# Patient Record
Sex: Male | Born: 1940 | Race: White | Hispanic: No | Marital: Married | Smoking: Former smoker
Health system: Southern US, Community
[De-identification: ages and names within clinical notes are randomized; demographics above are authoritative.]

## PROBLEM LIST (undated history)

## (undated) DIAGNOSIS — R55 Syncope and collapse: Secondary | ICD-10-CM

## (undated) DIAGNOSIS — G518 Other disorders of facial nerve: Secondary | ICD-10-CM

## (undated) DIAGNOSIS — I2510 Atherosclerotic heart disease of native coronary artery without angina pectoris: Secondary | ICD-10-CM

## (undated) DIAGNOSIS — G519 Disorder of facial nerve, unspecified: Secondary | ICD-10-CM

## (undated) DIAGNOSIS — I6789 Other cerebrovascular disease: Secondary | ICD-10-CM

## (undated) DIAGNOSIS — E669 Obesity, unspecified: Secondary | ICD-10-CM

## (undated) DIAGNOSIS — I10 Essential (primary) hypertension: Secondary | ICD-10-CM

## (undated) DIAGNOSIS — R0600 Dyspnea, unspecified: Secondary | ICD-10-CM

## (undated) DIAGNOSIS — E039 Hypothyroidism, unspecified: Secondary | ICD-10-CM

## (undated) DIAGNOSIS — E785 Hyperlipidemia, unspecified: Secondary | ICD-10-CM

## (undated) DIAGNOSIS — H02409 Unspecified ptosis of unspecified eyelid: Secondary | ICD-10-CM

## (undated) DIAGNOSIS — C641 Malignant neoplasm of right kidney, except renal pelvis: Secondary | ICD-10-CM

## (undated) DIAGNOSIS — E119 Type 2 diabetes mellitus without complications: Secondary | ICD-10-CM

## (undated) HISTORY — DX: Unspecified ptosis of unspecified eyelid: H02.409

## (undated) HISTORY — DX: Hyperlipidemia, unspecified: E78.5

## (undated) HISTORY — PX: CHOLECYSTECTOMY: SHX55

## (undated) HISTORY — DX: Obesity, unspecified: E66.9

## (undated) HISTORY — DX: Syncope and collapse: R55

## (undated) HISTORY — DX: Disorder of facial nerve, unspecified: G51.9

## (undated) HISTORY — PX: TOTAL KNEE ARTHROPLASTY: SHX125

## (undated) HISTORY — PX: CORONARY STENT PLACEMENT: SHX1402

## (undated) HISTORY — DX: Other disorders of facial nerve: G51.8

## (undated) HISTORY — DX: Essential (primary) hypertension: I10

## (undated) HISTORY — DX: Atherosclerotic heart disease of native coronary artery without angina pectoris: I25.10

## (undated) HISTORY — DX: Malignant neoplasm of right kidney, except renal pelvis: C64.1

## (undated) HISTORY — PX: TONSILLECTOMY: SUR1361

## (undated) HISTORY — DX: Other cerebrovascular disease: I67.89

## (undated) HISTORY — PX: MENISCECTOMY: SHX123

## (undated) HISTORY — DX: Hypothyroidism, unspecified: E03.9

---

## 1998-08-29 ENCOUNTER — Emergency Department (HOSPITAL_COMMUNITY): Admission: EM | Admit: 1998-08-29 | Discharge: 1998-08-29 | Payer: Self-pay | Admitting: *Deleted

## 1998-08-29 ENCOUNTER — Encounter: Payer: Self-pay | Admitting: *Deleted

## 1998-09-04 ENCOUNTER — Inpatient Hospital Stay (HOSPITAL_COMMUNITY): Admission: EM | Admit: 1998-09-04 | Discharge: 1998-09-09 | Payer: Self-pay | Admitting: Emergency Medicine

## 1998-09-04 ENCOUNTER — Encounter: Payer: Self-pay | Admitting: *Deleted

## 1998-09-05 ENCOUNTER — Encounter: Payer: Self-pay | Admitting: *Deleted

## 1998-10-07 ENCOUNTER — Encounter (HOSPITAL_COMMUNITY): Admission: RE | Admit: 1998-10-07 | Discharge: 1999-01-05 | Payer: Self-pay | Admitting: Cardiology

## 2002-07-20 ENCOUNTER — Encounter: Payer: Self-pay | Admitting: Emergency Medicine

## 2002-07-21 ENCOUNTER — Inpatient Hospital Stay (HOSPITAL_COMMUNITY): Admission: EM | Admit: 2002-07-21 | Discharge: 2002-07-24 | Payer: Self-pay | Admitting: Emergency Medicine

## 2002-07-22 ENCOUNTER — Encounter: Payer: Self-pay | Admitting: Internal Medicine

## 2003-01-10 ENCOUNTER — Ambulatory Visit (HOSPITAL_COMMUNITY): Admission: RE | Admit: 2003-01-10 | Discharge: 2003-01-10 | Payer: Self-pay | Admitting: Neurology

## 2005-11-22 ENCOUNTER — Ambulatory Visit: Payer: Self-pay | Admitting: Internal Medicine

## 2005-11-24 ENCOUNTER — Emergency Department (HOSPITAL_COMMUNITY): Admission: EM | Admit: 2005-11-24 | Discharge: 2005-11-25 | Payer: Self-pay | Admitting: Emergency Medicine

## 2005-12-03 ENCOUNTER — Ambulatory Visit: Payer: Self-pay | Admitting: Internal Medicine

## 2005-12-10 ENCOUNTER — Ambulatory Visit: Payer: Self-pay | Admitting: Internal Medicine

## 2005-12-10 ENCOUNTER — Encounter: Payer: Self-pay | Admitting: Cardiology

## 2005-12-10 ENCOUNTER — Ambulatory Visit: Payer: Self-pay

## 2005-12-17 ENCOUNTER — Ambulatory Visit (HOSPITAL_COMMUNITY): Admission: RE | Admit: 2005-12-17 | Discharge: 2005-12-17 | Payer: Self-pay | Admitting: Internal Medicine

## 2005-12-17 ENCOUNTER — Ambulatory Visit: Payer: Self-pay | Admitting: Internal Medicine

## 2005-12-29 ENCOUNTER — Ambulatory Visit: Payer: Self-pay

## 2008-04-25 ENCOUNTER — Emergency Department (HOSPITAL_COMMUNITY): Admission: EM | Admit: 2008-04-25 | Discharge: 2008-04-25 | Payer: Self-pay | Admitting: Emergency Medicine

## 2008-08-12 ENCOUNTER — Ambulatory Visit: Payer: Self-pay | Admitting: Cardiology

## 2008-08-12 ENCOUNTER — Inpatient Hospital Stay (HOSPITAL_COMMUNITY): Admission: EM | Admit: 2008-08-12 | Discharge: 2008-08-14 | Payer: Self-pay | Admitting: Emergency Medicine

## 2008-08-22 ENCOUNTER — Encounter (HOSPITAL_COMMUNITY): Admission: RE | Admit: 2008-08-22 | Discharge: 2008-11-20 | Payer: Self-pay | Admitting: Internal Medicine

## 2008-09-04 DIAGNOSIS — I10 Essential (primary) hypertension: Secondary | ICD-10-CM | POA: Insufficient documentation

## 2008-09-04 DIAGNOSIS — E7800 Pure hypercholesterolemia, unspecified: Secondary | ICD-10-CM

## 2008-09-04 DIAGNOSIS — I2510 Atherosclerotic heart disease of native coronary artery without angina pectoris: Secondary | ICD-10-CM | POA: Insufficient documentation

## 2008-09-04 DIAGNOSIS — I200 Unstable angina: Secondary | ICD-10-CM

## 2008-09-05 ENCOUNTER — Encounter: Payer: Self-pay | Admitting: Cardiology

## 2008-09-05 ENCOUNTER — Ambulatory Visit: Payer: Self-pay | Admitting: Cardiology

## 2008-09-05 DIAGNOSIS — E039 Hypothyroidism, unspecified: Secondary | ICD-10-CM | POA: Insufficient documentation

## 2008-09-05 DIAGNOSIS — Z8679 Personal history of other diseases of the circulatory system: Secondary | ICD-10-CM

## 2008-09-06 ENCOUNTER — Ambulatory Visit: Payer: Self-pay | Admitting: Cardiology

## 2008-09-06 LAB — CONVERTED CEMR LAB
Free T4: 1.1 ng/dL (ref 0.6–1.6)
T3, Free: 3.4 pg/mL (ref 2.3–4.2)

## 2008-09-23 ENCOUNTER — Encounter: Payer: Self-pay | Admitting: Pulmonary Disease

## 2008-09-23 ENCOUNTER — Ambulatory Visit (HOSPITAL_BASED_OUTPATIENT_CLINIC_OR_DEPARTMENT_OTHER): Admission: RE | Admit: 2008-09-23 | Discharge: 2008-09-23 | Payer: Self-pay | Admitting: Cardiology

## 2008-09-28 ENCOUNTER — Ambulatory Visit: Payer: Self-pay | Admitting: Pulmonary Disease

## 2008-11-06 ENCOUNTER — Ambulatory Visit: Payer: Self-pay | Admitting: Pulmonary Disease

## 2008-11-06 DIAGNOSIS — G4733 Obstructive sleep apnea (adult) (pediatric): Secondary | ICD-10-CM | POA: Insufficient documentation

## 2008-11-08 ENCOUNTER — Telehealth: Payer: Self-pay | Admitting: Cardiology

## 2008-11-21 ENCOUNTER — Encounter (HOSPITAL_COMMUNITY): Admission: RE | Admit: 2008-11-21 | Discharge: 2009-02-19 | Payer: Self-pay | Admitting: Cardiology

## 2009-02-06 ENCOUNTER — Encounter: Payer: Self-pay | Admitting: Cardiology

## 2009-04-22 ENCOUNTER — Encounter: Payer: Self-pay | Admitting: Pulmonary Disease

## 2009-04-25 ENCOUNTER — Encounter: Payer: Self-pay | Admitting: Pulmonary Disease

## 2010-05-26 ENCOUNTER — Telehealth: Payer: Self-pay | Admitting: Cardiology

## 2010-05-28 ENCOUNTER — Encounter: Payer: Self-pay | Admitting: Cardiology

## 2010-06-01 ENCOUNTER — Encounter: Payer: Self-pay | Admitting: Cardiology

## 2010-06-02 ENCOUNTER — Ambulatory Visit: Payer: Self-pay | Admitting: Cardiology

## 2010-07-19 HISTORY — PX: NEPHRECTOMY RADICAL: SUR878

## 2010-08-18 NOTE — Assessment & Plan Note (Signed)
Summary: rov/ appt is 4 p.m. / gd   Visit Type:  Follow-up Primary Provider:  Mila Palmer, MD  CC:  CAD.  History of Present Illness: The patient presents for followup of his known coronary disease. Since I last saw him he has lost 50 pounds through diet and healthy lifestyle. He feels much better since then. He sent his lipids followed and has been able to cut down on his medicine. He has more energy. He is starting to do some exercising including a stationary bicycle. He denies any chest pressure, neck or arm discomfort. He denies any palpitations, presyncope or syncope. He is having no PND or orthopnea.  Allergies: No Known Drug Allergies  Past History:  Past Medical History: 1. Coronary artery disease (100% right coronary artery occlusion       status post stenting, 85% circumflex occlusion status post       stenting, 50% LAD stenosis managed in 2000 medically).   2. Syncope (the patient had a negative EP study.  He saw Dr. Ladona Ridgel       and had a loop implanted.  He had this for a couple of years.  He       actually had an event, but after the loop had stopped recording.       He had his last syncopal episode in 2007, and has had none since       then.  He has had his loop explanted).   3. Hypothyroidism.   4. Obesity.   5. Hypertension.   6. Hyperlipidemia.      Past Surgical History: Arthroscopic knee surgery Tonsillectomy Cholecystectomy.   Review of Systems       As stated in the HPI and negative for all other systems.   Vital Signs:  Patient profile:   70 year old male Height:      68 inches Weight:      228 pounds BMI:     34.79 Pulse rate:   60 / minute Resp:     16 per minute BP sitting:   118 / 72  (right arm)  Vitals Entered By: Marrion Coy, CNA (June 02, 2010 4:13 PM)  Physical Exam  General:  Well developed, well nourished, in no acute distress. Head:  normocephalic and atraumatic Neck:  Neck supple, no JVD. No masses, thyromegaly or  abnormal cervical nodes. Chest Wall:  no deformities or breast masses noted Lungs:  Clear bilaterally to auscultation and percussion. Heart:  Non-displaced PMI, chest non-tender; regular rate and rhythm, S1, S2 without murmurs, rubs or gallops. Carotid upstroke normal, no bruit. Normal abdominal aortic size, no bruits. Femorals normal pulses, no bruits. Pedals normal pulses. No edema, no varicosities. Abdomen:  Bowel sounds positive; abdomen soft and non-tender without masses, organomegaly, or hernias noted. No hepatosplenomegaly. Msk:  Back normal, normal gait. Muscle strength and tone normal. Extremities:  No clubbing or cyanosis. Neurologic:  Alert and oriented x 3. Skin:  Intact without lesions or rashes. Cervical Nodes:  no significant adenopathy Inguinal Nodes:  no significant adenopathy Psych:  Normal affect.   EKG  Procedure date:  06/02/2010  Findings:      sinus rhythm, rate 64, axis within normal limits, intervals within normal limits, nonspecific inferior T-wave changes  Impression & Recommendations:  Problem # 1:  CAD, UNSPECIFIED SITE (ICD-414.00) He has had no new symptoms since his catheterization in January of last year. No further cardiovascular testing is suggested. He will continue with risk reduction. Orders: EKG  w/ Interpretation (93000)  Problem # 2:  MORBID OBESITY (ICD-278.01) I am very proud of his weight loss and encourage more of the same. He is now doing exercise to his diet regimen.  Problem # 3:  HYPERCHOLESTEROLEMIA (ICD-272.0) This is managed per his primary physician with a goal LDL less than 100 and HDL greater than 40.  Patient Instructions: 1)  Your physician recommends that you schedule a follow-up appointment in: 18 months with Dr Antoine Poche 2)  Your physician recommends that you continue on your current medications as directed. Please refer to the Current Medication list given to you today.

## 2010-08-18 NOTE — Miscellaneous (Signed)
  Clinical Lists Changes  Observations: Added new observation of SLEEP STUDY:  1. Split night study reveals severe obstructive sleep apnea with an       apnea-hypopnea index of 109 events per hour during the diagnostic       portion, and oxygen desaturation as low as 68%.  He was then placed       on continuous positive airway pressure with a ResMed Quattro full-       face mask, and found to have an optimal pressure of 12 cm of water.       The patient should also be encouraged to work aggressively on       weight loss.   2. Occasional premature atrial contraction and premature ventricular       contraction seen without clinically significant arrhythmia noted.        (09/23/2008 9:40)      Sleep Study  Procedure date:  09/23/2008  Findings:       1. Split night study reveals severe obstructive sleep apnea with an       apnea-hypopnea index of 109 events per hour during the diagnostic       portion, and oxygen desaturation as low as 68%.  He was then placed       on continuous positive airway pressure with a ResMed Quattro full-       face mask, and found to have an optimal pressure of 12 cm of water.       The patient should also be encouraged to work aggressively on       weight loss.   2. Occasional premature atrial contraction and premature ventricular       contraction seen without clinically significant arrhythmia noted.

## 2010-08-18 NOTE — Progress Notes (Signed)
Summary: Pt need to stop plavix due to procedure on 06/04/10  Phone Note From Other Clinic Call back at [phone]   Caller: [facility]/ Britta Mccreedy Summary of Call: Need to stop Plavix due to procedure 06/04/10 Initial call taken by: Judie Grieve,  May 26, 2010 2:01 PM  Follow-up for Phone Call        The patient is long overdue for follow up and he needs to see me before I can clear him for any procedure. Follow-up by: Rollene Rotunda, MD, [facility],  May 28, 2010 1:21 PM  Additional Follow-up for Phone Call Additional follow up Details #1::        Request sent for pt to be called with an appt. Additional Follow-up by: Charolotte Capuchin, RN,  May 28, 2010 4:52 PM

## 2010-08-20 NOTE — Letter (Signed)
[facility]   [facility]ance Note   Imported By: Roderic Ovens 08/14/2010 13:12:56  _____________________________________________________________________  External Attachment:    Type:   Image     Comment:   External Document

## 2010-10-28 ENCOUNTER — Emergency Department (HOSPITAL_COMMUNITY): Payer: Medicare Other

## 2010-10-28 ENCOUNTER — Observation Stay (HOSPITAL_COMMUNITY)
Admission: EM | Admit: 2010-10-28 | Discharge: 2010-10-30 | Disposition: A | Discharge status: Home / Self Care | Payer: Medicare Other | Attending: Internal Medicine | Admitting: Internal Medicine

## 2010-10-28 DIAGNOSIS — F959 Tic disorder, unspecified: Secondary | ICD-10-CM | POA: Insufficient documentation

## 2010-10-28 DIAGNOSIS — I252 Old myocardial infarction: Secondary | ICD-10-CM | POA: Insufficient documentation

## 2010-10-28 DIAGNOSIS — R319 Hematuria, unspecified: Secondary | ICD-10-CM | POA: Insufficient documentation

## 2010-10-28 DIAGNOSIS — I10 Essential (primary) hypertension: Secondary | ICD-10-CM | POA: Insufficient documentation

## 2010-10-28 DIAGNOSIS — Z7902 Long term (current) use of antithrombotics/antiplatelets: Secondary | ICD-10-CM | POA: Insufficient documentation

## 2010-10-28 DIAGNOSIS — Z7982 Long term (current) use of aspirin: Secondary | ICD-10-CM | POA: Insufficient documentation

## 2010-10-28 DIAGNOSIS — Z9089 Acquired absence of other organs: Secondary | ICD-10-CM | POA: Insufficient documentation

## 2010-10-28 DIAGNOSIS — Z79899 Other long term (current) drug therapy: Secondary | ICD-10-CM | POA: Insufficient documentation

## 2010-10-28 DIAGNOSIS — E039 Hypothyroidism, unspecified: Secondary | ICD-10-CM | POA: Insufficient documentation

## 2010-10-28 DIAGNOSIS — R9431 Abnormal electrocardiogram [ECG] [EKG]: Secondary | ICD-10-CM | POA: Insufficient documentation

## 2010-10-28 DIAGNOSIS — E785 Hyperlipidemia, unspecified: Secondary | ICD-10-CM | POA: Insufficient documentation

## 2010-10-28 DIAGNOSIS — E7800 Pure hypercholesterolemia, unspecified: Secondary | ICD-10-CM | POA: Insufficient documentation

## 2010-10-28 DIAGNOSIS — N390 Urinary tract infection, site not specified: Secondary | ICD-10-CM | POA: Insufficient documentation

## 2010-10-28 DIAGNOSIS — N400 Enlarged prostate without lower urinary tract symptoms: Secondary | ICD-10-CM | POA: Insufficient documentation

## 2010-10-28 DIAGNOSIS — I2510 Atherosclerotic heart disease of native coronary artery without angina pectoris: Secondary | ICD-10-CM | POA: Insufficient documentation

## 2010-10-28 DIAGNOSIS — N289 Disorder of kidney and ureter, unspecified: Principal | ICD-10-CM | POA: Insufficient documentation

## 2010-10-28 DIAGNOSIS — K449 Diaphragmatic hernia without obstruction or gangrene: Secondary | ICD-10-CM | POA: Insufficient documentation

## 2010-10-28 DIAGNOSIS — B952 Enterococcus as the cause of diseases classified elsewhere: Secondary | ICD-10-CM | POA: Insufficient documentation

## 2010-10-28 DIAGNOSIS — I700 Atherosclerosis of aorta: Secondary | ICD-10-CM | POA: Insufficient documentation

## 2010-10-28 LAB — CBC
HCT: 43.9 % (ref 39.0–52.0)
Hemoglobin: 14.5 g/dL (ref 13.0–17.0)
MCH: 27.8 pg (ref 26.0–34.0)
MCHC: 33 g/dL (ref 30.0–36.0)
Platelets: 227 10*3/uL (ref 150–400)
RDW: 14 % (ref 11.5–15.5)
WBC: 10.4 10*3/uL (ref 4.0–10.5)

## 2010-10-28 LAB — BASIC METABOLIC PANEL
BUN: 21 mg/dL (ref 6–23)
Calcium: 9.1 mg/dL (ref 8.4–10.5)
Creatinine, Ser: 1.04 mg/dL (ref 0.4–1.5)
GFR calc non Af Amer: >60 mL/min (ref >60)
Glucose, Bld: 87 mg/dL (ref 70–99)
Potassium: 3.5 mEq/L (ref 3.5–5.1)

## 2010-10-28 LAB — URINALYSIS, ROUTINE W REFLEX MICROSCOPIC: pH: 5 (ref 5.0–8.0)

## 2010-10-28 LAB — PROTIME-INR: Prothrombin Time: 13.8 seconds (ref 11.6–15.2)

## 2010-10-28 LAB — URINE MICROSCOPIC-ADD ON

## 2010-10-28 LAB — DIFFERENTIAL
Basophils Absolute: 0 10*3/uL (ref 0.0–0.1)
Basophils Relative: 0 % (ref 0–1)
Eosinophils Absolute: 0 10*3/uL (ref 0.0–0.7)
Eosinophils Relative: 0 % (ref 0–5)
Lymphocytes Relative: 20 % (ref 12–46)
Lymphs Abs: 2.1 10*3/uL (ref 0.7–4.0)
Monocytes Absolute: 1 10*3/uL (ref 0.1–1.0)
Monocytes Relative: 10 % (ref 3–12)
Neutrophils Relative %: 69 % (ref 43–77)

## 2010-10-29 ENCOUNTER — Emergency Department (HOSPITAL_COMMUNITY): Payer: Medicare Other

## 2010-10-29 ENCOUNTER — Encounter (HOSPITAL_COMMUNITY): Payer: Self-pay | Admitting: Radiology

## 2010-10-29 LAB — DIFFERENTIAL
Eosinophils Absolute: 0.1 10*3/uL (ref 0.0–0.7)
Eosinophils Relative: 1 % (ref 0–5)
Lymphocytes Relative: 24 % (ref 12–46)

## 2010-10-29 LAB — COMPREHENSIVE METABOLIC PANEL
ALT: 19 U/L (ref 0–53)
Albumin: 3.4 g/dL — ABNORMAL LOW (ref 3.5–5.2)
BUN: 16 mg/dL (ref 6–23)
Calcium: 8.8 mg/dL (ref 8.4–10.5)
GFR calc Af Amer: >60 mL/min (ref >60)
GFR calc non Af Amer: >60 mL/min (ref >60)
Glucose, Bld: 102 mg/dL — ABNORMAL HIGH (ref 70–99)
Total Bilirubin: 0.8 mg/dL (ref 0.3–1.2)

## 2010-10-29 LAB — CBC
Platelets: 214 10*3/uL (ref 150–400)
RDW: 14.2 % (ref 11.5–15.5)

## 2010-10-29 LAB — PHOSPHORUS: Phosphorus: 3.5 mg/dL (ref 2.3–4.6)

## 2010-10-29 MED ORDER — IOHEXOL 300 MG/ML  SOLN
125.0000 mL | Freq: Once | INTRAMUSCULAR | Status: AC | PRN
  Administered 2010-10-29: 125 mL via INTRAVENOUS

## 2010-10-31 LAB — URINE CULTURE
Colony Count: 35000
Culture  Setup Time: 201204120346

## 2010-11-02 LAB — BASIC METABOLIC PANEL
BUN: 16 mg/dL (ref 6–23)
BUN: 9 mg/dL (ref 6–23)
CO2: 27 mEq/L (ref 19–32)
Calcium: 9.3 mg/dL (ref 8.4–10.5)
Chloride: 101 mEq/L (ref 96–112)
Creatinine, Ser: 0.95 mg/dL (ref 0.4–1.5)
Creatinine, Ser: 0.96 mg/dL (ref 0.4–1.5)
GFR calc non Af Amer: >60 mL/min (ref >60)
Potassium: 3.9 mEq/L (ref 3.5–5.1)

## 2010-11-02 LAB — LIPID PANEL
Cholesterol: 131 mg/dL (ref 0–200)
HDL: 28 mg/dL — ABNORMAL LOW (ref >39)
LDL Cholesterol: UNDETERMINED mg/dL (ref 0–99)
Total CHOL/HDL Ratio: 4.7 RATIO

## 2010-11-02 LAB — CK TOTAL AND CKMB (NOT AT ARMC)
CK, MB: 2.1 ng/mL (ref 0.3–4.0)
Relative Index: 1.5 (ref 0.0–2.5)
Total CK: 122 U/L (ref 7–232)
Total CK: 141 U/L (ref 7–232)

## 2010-11-02 LAB — DIFFERENTIAL
Basophils Relative: 1 % (ref 0–1)
Basophils Relative: 1 % (ref 0–1)
Eosinophils Absolute: 0.1 10*3/uL (ref 0.0–0.7)
Eosinophils Absolute: 0.1 10*3/uL (ref 0.0–0.7)
Eosinophils Relative: 1 % (ref 0–5)
Eosinophils Relative: 1 % (ref 0–5)
Lymphs Abs: 2 10*3/uL (ref 0.7–4.0)
Monocytes Absolute: 0.7 10*3/uL (ref 0.1–1.0)
Monocytes Absolute: 0.8 10*3/uL (ref 0.1–1.0)
Monocytes Relative: 10 % (ref 3–12)
Monocytes Relative: 8 % (ref 3–12)

## 2010-11-02 LAB — CBC
HCT: 41.3 % (ref 39.0–52.0)
HCT: 43.7 % (ref 39.0–52.0)
Hemoglobin: 14.4 g/dL (ref 13.0–17.0)
MCHC: 32.9 g/dL (ref 30.0–36.0)
MCHC: 33.1 g/dL (ref 30.0–36.0)
MCV: 85.5 fL (ref 78.0–100.0)
MCV: 85.6 fL (ref 78.0–100.0)
MCV: 86.4 fL (ref 78.0–100.0)
Platelets: 229 10*3/uL (ref 150–400)
Platelets: 236 10*3/uL (ref 150–400)
RBC: 5.06 MIL/uL (ref 4.22–5.81)
RDW: 13.9 % (ref 11.5–15.5)
WBC: 8.2 10*3/uL (ref 4.0–10.5)

## 2010-11-02 LAB — PROTIME-INR: Prothrombin Time: 13.3 seconds (ref 11.6–15.2)

## 2010-11-02 LAB — CARDIAC PANEL(CRET KIN+CKTOT+MB+TROPI): Troponin I: <0.01 ng/mL (ref 0.00–0.06)

## 2010-11-02 LAB — COMPREHENSIVE METABOLIC PANEL
ALT: 27 U/L (ref 0–53)
Alkaline Phosphatase: 53 U/L (ref 39–117)
BUN: 16 mg/dL (ref 6–23)
CO2: 28 mEq/L (ref 19–32)
Calcium: 9 mg/dL (ref 8.4–10.5)
GFR calc non Af Amer: >60 mL/min (ref >60)
Glucose, Bld: 127 mg/dL — ABNORMAL HIGH (ref 70–99)
Sodium: 140 mEq/L (ref 135–145)

## 2010-11-02 LAB — HEMOGLOBIN A1C
Hgb A1c MFr Bld: 6.8 % — ABNORMAL HIGH (ref 4.6–6.1)
Mean Plasma Glucose: 148 mg/dL

## 2010-11-02 LAB — POCT CARDIAC MARKERS: Myoglobin, poc: 116 ng/mL (ref 12–200)

## 2010-11-02 NOTE — Discharge Summary (Signed)
NAME:  Thomas Michael, Thomas Michael NO.:  [id number]  MEDICAL RECORD NO.:  [id number]           PATIENT TYPE:  O  LOCATION:  1421                         FACILITY:  [facility]  PHYSICIAN:  Andreas Blower, MD       DATE OF BIRTH:  1941/03/30  DATE OF ADMISSION:  10/28/2010 DATE OF DISCHARGE:                              DISCHARGE SUMMARY   PRIMARY CARE PHYSICIAN:  Emeterio Reeve, MD.  UROLOGIST:  Dr. Lindaann Slough, MD  DISCHARGE DIAGNOSES: 1. Right renal mass. 2. Hematuria. 3. Enterococcus urinary tract infection. 4. Hypothyroidism. 5. Hypertension. 6. Hyperlipidemia. 7. History of coronary artery disease. 8. History of the right facial tick.  DISCHARGE MEDICATIONS: 1. Keflex 500 mg p.o. twice daily. 2. Ibuprofen 400 mg every 8 hours as needed for pain. 3. Aspirin 325 mg p.o. daily. 4. Multivitamin 1 tablet p.o. daily 5. Rosuvastatin 20 mg p.o. daily 6. Valsartan/hydrochlorothiazide 160/12.5 mg p.o. daily 7. Levothyroxine 200 mcg p.o. daily 8. Metoprolol succinate 47.5 mg p.o. q.a.m. 9. Niacin 50 mg p.o. daily 10.Plavix 75 mg p.o. daily. 11.Sildenafil 100 mg daily as needed prior to sexual intercourse. 12.Vitamin E 400 units p.o. daily  BRIEF ADMITTING HISTORY AND PHYSICAL:  Mr. Boy is a 70 year old Caucasian male with history of coronary artery disease, hypertension, status post cholecystectomy, who presented with suprapubic pain and hematuria.  RADIOLOGY/IMAGING:  The patient had a CT of the abdomen and pelvis with and without contrast on October 29, 2010, which showed enhancing 8.4 cm mass in the right lower pole highly suspicious for renal cell carcinoma.  The patient had chest x-ray two-view which showed no evidence of acute cardiac or pulmonary process.  The patient had CT of the abdomen and pelvis without contrast on October 28, 2010, which showed large right renal mass measuring 8.1 x 6.7 x 5.7 cm in maximal dimension.  Blood in the urinary bladder.   Mildly enlarged prostate gland.  Small to moderate sized hiatal hernia.  LABORATORY DATA:  CBC shows white count of 9.7, hemoglobin 13.4, hematocrit 41.2, platelet count 214.  INR is 1.04.  Electrolytes normal with a creatinine of 1.03.  Liver function tests normal.  TSH is 10.795. UA was positive for nitrites, had large leukocytes.  Urine culture growing 35,000 colonies of enterococcus species.  CONSULTATIONS:  Dr. Brunilda Payor from Urology was consulted.  HOSPITAL COURSE BY PROBLEM: 1. Hematuria and right renal mass.  Urology was consulted.  The     patient had the above imaging obtained.  Plan for outpatient     surgery.  The patient will most likely need cardiology evaluation     for surgical clearance prior to surgery.  The patient's hematuria     has improved on its own during the course of hospital stay.  The     patient was moderately hydrated during the course of hospital stay.     Prior to discharge, the patient voided about 250 cc of urine with     some intermittent blood clots.  The patient reports that he had no     difficulty voiding.  The patient's Foley catheter was  removed prior     to discharge and the patient voided successfully prior to     discharge. 2. Urinary tract infection.  The patient initially was started on     ceftriaxone.  Urine culture grew enterococcus species.  The     patient's antibiotics were changed to cephalexin which she will     continue for 3 more days to complete a 5-day course. 3. Hypothyroidism with elevated TSH of 10.795.  The patient was     instructed to follow up with his primary care physician and to have     a discussion with his primary care physician by rechecking the TSH     and free T4, based on those lab findings could have his Synthroid     adjusted at that time. 4. Hyperlipidemia.  Continue the patient on statin. 5. Hypertension, stable.  Continue the patient on medications. 6. History of coronary artery disease status post stent  placement.     Continue the patient on home medications and continue the patient     on Plavix at the time of discharge.  Again, the patient will need a     cardiology evaluation prior to surgery as to when the Plavix should     be started and see if the patient can be cleared for surgery from a     cardiac standpoint.  DISPOSITION AND FOLLOWUP:  The patient to follow up with Dr. Mila Palmer an outpatient for elevated TSH.  The patient to follow up with Dr. Brunilda Payor for surgical consideration for the right renal mass.  The patient to also consider following up with cardiology as an outpatient for cardiac evaluation prior to surgery.  Time spent on discharge, talking to the patient, talking to the family, talking to consultants, and coordinating care was 35 minutes.   Andreas Blower, MD   SR/MEDQ  D:  10/30/2010  T:  10/30/2010  Job:  [id number]  Electronically Signed by Wardell Heath Emanuell Morina  on 11/01/2010 10:50:56 AM

## 2010-11-14 NOTE — H&P (Signed)
NAME:  Thomas Michael, Thomas Michael NO.:  [id number]  MEDICAL RECORD NO.:  [id number]           PATIENT TYPE:  E  LOCATION:  WLED                         FACILITY:  [facility]  PHYSICIAN:  Michiel Cowboy, MDDATE OF BIRTH:  09/01/1940  DATE OF ADMISSION:  10/28/2010 DATE OF DISCHARGE:                             HISTORY & PHYSICAL   ATTENDING PHYSICIAN:  Michiel Cowboy, M.D.  PRIMARY CARE PROVIDER:  Emeterio Reeve, M.D.  CHIEF COMPLAINT:  Blood in urine.  HISTORY OF PRESENT ILLNESS:  Patient is a 70 year old gentleman with past medical history significant for history of coronary artery disease and status post cholecystectomy.  Patient stated he had been having suprapubic pain and history of kidney stones in the past and have been passing blood.  Since this morning, he had been having some abdominal pains and flank pain as well, which brought him into the Emergency Department thinking he may have had mild kidney stones.  He had a noncontrast CT scan of his abdomen and pelvis done, which showed a 8 x 6 cm right renal mass.  At which point, triad hospitalist was called for admission.  He did have signs of urinary tract infections as well on his UA.  He denies any recent chest pains.  No fevers.  No chills.  No nausea.  No vomiting.  No abdominal complaints.  Otherwise, review of systems are negative.  PAST MEDICAL HISTORY:  Significant for: 1. Coronary artery disease, status post stenting in February 2010,     since then, he had been on Plavix. 2. Hypercholesteremia. 3. Hypertension. 4. Hypothyroidism. 5. 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  SOCIAL HISTORY:  Patient does not smoke.  Drinks very rarely.  Does not abuse drugs.  FAMILY HISTORY:  Noncontributory.  ALLERGIES:  None.  MEDICATIONS: 1. Plavix 75 mg daily. 2. Multivitamins. 3. Levothyroxine 200 mg daily. 4. Diovan 160/12.5 mg daily. 5.  Tylenol/hydrochlorothiazide. 6. Niacin 500 mg daily. 7. Aspirin 325 mg. 8. Rosuvastatin daily. 9. [place] equivalent of Toprol 47.5 mg daily.  PHYSICAL EXAMINATION:  VITAL SIGNS:  Temperature 98.2, blood pressure 161/97, pulse 64, respirations 20, satting 98% on room air. GENERAL:  Patient appears to be in no acute distress. HEAD:  Nontraumatic. MOUTH:  Moist mucous membranes. LUNGS:  Clear to auscultation bilaterally. HEART:  Regular rhythm.  No murmurs appreciated. ABDOMEN:  Soft, nontender, nondistended. EXTREMITIES:  Lower extremities:  No clubbing, cyanosis or edema. GENITOURINARY:  Foley is in place with blood in the Foley. NEUROLOGICALLY:  Strength 5/5 in all four extremities.  Otherwise, neurologically intact.  There is a intermittent tic present on the right side, which she states is chronic.  LABORATORY DATA:  White blood cell count 10.4, hemoglobin 14.5.  Sodium 133, potassium 3.5 creatinine 1.04.  Calcium 9.1.  UA positive for nitrites, red blood cell count too numerous to count, 11 white blood cells and bacteria.  DIAGNOSTIC STUDIES:  CT without contrast showing right kidney mass 8- x 6 cm.  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  We will admit to observation.  I have discussed his case with urology, who will come and see him in the a.m. We will make sure that he will be observed and make sure his creatinine is stable after placement of Foley and if he is able to urinate good output after the Foley was placed.  We will obtain computed tomography with intravenous contrast for staging.  Urinary tract infection:  We will treat with Rocephin.  History of coronary artery disease:  We will stop Plavix in case he needs an operation.  Low potassium:  We will replace.  Low thyroid:  We will recheck.  Prophylaxis:  Protonix and sequential compression devices.     Michiel Cowboy,  MD     AVD/MEDQ  D:  10/28/2010  T:  10/28/2010  Job:  [id number]  cc:   Emeterio Reeve, MD Fax: [phone]  Electronically Signed by Therisa Doyne MD on 11/14/2010 09:38:45 PM

## 2010-11-14 NOTE — Consult Note (Signed)
NAME:  Thomas Michael, Thomas Michael NO.:  [id number]  MEDICAL RECORD NO.:  [id number]           PATIENT TYPE:  O  LOCATION:  1421                         [facility]ACILITY:  [facility]  PHYSICIAN:  Danae Chen, M.D.  DATE OF BIRTH:  1940-09-03  DATE OF CONSULTATION:  10/29/2010 DATE OF DISCHARGE:                                CONSULTATION   REASON FOR CONSULTATION:  Gross hematuria and right renal mass.  HISTORY:  The patient is a 70 year old male who was doing well until yesterday afternoon when he started passing blood in his urine.  By the afternoon, he was unable to urinate and he went to the emergency room. A Foley catheter inserted in the bladder drained bloody urine.  He also had several blood clots.  A CT scan without contrast showed an 8.1 x 6.7 x 5.9 cm solid-appearing mass in the mid to inferior pole of the right kidney.  The patient denies any history of kidney stone.  He did not have any pain.  Because of his hematuria, he was admitted and I was asked to see him in consultation.  The patient denies any previous history of gross hematuria.  PAST MEDICAL HISTORY:  Positive for, 1. Coronary artery disease and he had coronary artery stents in 2000     and 2010. 2. History of hypercholesterolemia. 3. History of hypertension. 4. History of hypothyroidism.  SOCIAL HISTORY:  He is single.  He does not smoke and drinks occasionally.  FAMILY HISTORY:  His mother is 25 years old.  His father died of a heart attack at age of 23 in 80.  He has 4 children.  MEDICATIONS: 1. Plavix 75 mg daily. 2. Multivitamins. 3. Levothyroxine 200 mcg daily. 4. Diovan 160/12.5 mg. 5. Niacin 500 mg daily. 6. Aspirin 325 mg a day. 7. Rosuvastatin. 8. Toprol XL.  ALLERGIES:  No known drug allergies.  PAST SURGICAL HISTORY:  He had cholecystectomy and right knee surgery in the past.  REVIEW OF SYSTEMS:  As noted in the HPI and everything else is negative.  PHYSICAL EXAMINATION:   GENERAL:  This is a well-developed 70 year old male who is in no acute distress at this time.  He is alert and oriented to time, place, and person. VITAL SIGNS:  Blood pressure is 131/76, pulse 65, respirations 18, temperature 97.5. SKIN:  Warm and dry. HEAD:  Normal. HEENT:  He has pink conjunctivae. EARS, NOSE, and THROAT:  Within normal limits. NECK:  Supple.  No cervical adenopathy.  No thyromegaly. CHEST:  Symmetrical. LUNGS:  Fully expanded and clear to percussion and auscultation. HEART:  Regular rhythm. ABDOMEN:  Soft, nondistended, nontender.  He has no CVA tenderness. Kidneys are not palpable.  He has no hepatomegaly, no splenomegaly.  His bladder is not distended.  He has no inguinal hernia.  No inguinal adenopathy. GENITALIA:  Penis and scrotal contents are within normal limits.  He has an indwelling Foley catheter that is draining bloody urine.  He has no testicular mass.  Cords and epididymis are within normal limits. EXTREMITIES:  Within normal limits.  He has noted edema.  No deformities. RECTAL:  He has no external hemorrhoids.  Sphincter tone is normal. Prostate is enlarged, 40 g without any nodules and seminal vesicles are not palpable.  LABORATORY DATA:  His hemoglobin on April 11th was 14.5, hematocrit 43.9, and WBC 10.4.  On April 12th, his hemoglobin is 13.4, hematocrit 41.2, and WBC 9.6.  His BUN today is 16, creatinine 1.03.  Sodium 136, potassium 3.5.  Urine culture is pending.  The first CT scan was done without contrast.  I advised the patient to have a CT with IV contrast.  I reviewed both CT scan and there is an 8 x 7 x 6 cm mass in the mid-to-lower pole of the right kidney and the mass appears to involve the hilum of the kidney.  There is deformity of the caliceal system by the renal mass.  IMPRESSION:  Gross hematuria, large right renal mass, history of coronary artery disease, and hypertension.  SUGGESTIONS:  Leave Foley catheter indwelling  until the urine is clear. Foley catheter can then be removed since the patient did not have any voiding symptoms prior to this episode of gross hematuria.  The difficulty voiding was probably associated with clots urinary retention.  The patient will need cystoscopy to be certain that there is no tumor in the bladder.  He will probably need a right nephrectomy because of the size and location of the tumor.  He can be discharged home and he will be evaluated as an outpatient.  He will need medical clearance from Dr. Antoine Poche.  We will follow the patient with you.     Danae Chen, M.D.     MN/MEDQ  D:  10/29/2010  T:  10/30/2010  Job:  [id number]  Electronically Signed by Lindaann Slough M.D. on 11/14/2010 10:45:43 AM

## 2010-11-17 ENCOUNTER — Encounter: Payer: Self-pay | Admitting: Cardiology

## 2010-11-19 ENCOUNTER — Encounter: Payer: Self-pay | Admitting: Cardiology

## 2010-11-19 ENCOUNTER — Ambulatory Visit (INDEPENDENT_AMBULATORY_CARE_PROVIDER_SITE_OTHER): Payer: Medicare Other | Admitting: Cardiology

## 2010-11-19 VITALS — BP 119/77 | HR 66 | Ht 69.0 in | Wt 224.0 lb

## 2010-11-19 DIAGNOSIS — Z01818 Encounter for other preprocedural examination: Secondary | ICD-10-CM

## 2010-11-19 DIAGNOSIS — I10 Essential (primary) hypertension: Secondary | ICD-10-CM

## 2010-11-19 DIAGNOSIS — Z8679 Personal history of other diseases of the circulatory system: Secondary | ICD-10-CM

## 2010-11-19 DIAGNOSIS — I2510 Atherosclerotic heart disease of native coronary artery without angina pectoris: Secondary | ICD-10-CM

## 2010-11-19 DIAGNOSIS — E7800 Pure hypercholesterolemia, unspecified: Secondary | ICD-10-CM

## 2010-11-19 NOTE — Assessment & Plan Note (Signed)
His cholesterol is followed by his primary provider.

## 2010-11-19 NOTE — Assessment & Plan Note (Signed)
I did ask him to mention his sleep apnea and the anesthesiologist.  He reminds me that this diagnosis was made before he lost 60 pounds.

## 2010-11-19 NOTE — Assessment & Plan Note (Signed)
His blood pressure is well controlled he will continue meds as listed.

## 2010-11-19 NOTE — Assessment & Plan Note (Signed)
The patient has a high functional level. He had recent evaluation in January 2010 and no new symptoms since then. There are no high risk findings. Therefore, according to ACC/AHA guidelines the patient is an acceptable risk for the planned surgery. He would be required to come off of his Plavix he can continue his aspirin.

## 2010-11-19 NOTE — Progress Notes (Signed)
HPI Patient presents for evaluation preoperatively prior to nephrectomy for possible renal cell cancer. He is status post stenting as described below in 2010. Since that time he has done exceptionally well. He has participated in risk reduction. He has been active until he thought exercising routinely walks quite a bit and does some heavy lifting without bringing on any chest discomfort.  It is significant functional level he denies any chest pressure, neck or arm discomfort. He has no shortness of breath, PND or orthopnea. He has no palpitations, presyncope or syncope. He has had no weight gain or edema.  No Known Allergies  Current Outpatient Prescriptions  Medication Sig Dispense Refill  . aspirin 325 MG tablet Take 325 mg by mouth daily.        . clopidogrel (PLAVIX) 75 MG tablet Take 37.5 mg by mouth daily.        Marland Kitchen levothyroxine (SYNTHROID, LEVOTHROID) 200 MCG tablet Take 200 mcg by mouth daily.        . metoprolol (TOPROL-XL) 50 MG 24 hr tablet Take 50 mg by mouth daily.        . niacin 500 MG CR capsule Take 500 mg by mouth at bedtime.        . rosuvastatin (CRESTOR) 20 MG tablet Take 20 mg by mouth daily.        . sildenafil (VIAGRA) 100 MG tablet Take 100 mg by mouth as needed.        . Valsartan-Hydrochlorothiazide (DIOVAN HCT PO) Take by mouth daily.        . vitamin E 400 UNIT capsule Take 400 Units by mouth daily.          Past Medical History  Diagnosis Date  . CAD (coronary artery disease)   . Syncope   . Hypothyroidism   . Obesity   . HTN (hypertension)     Jan 2010 He had a 95% mid LAD lesion. There was 99% stenosis at the distal edge of the stented area in the right coronary artery. The EF was 65%. He had drug-eluting stents placed in both the LAD and the right coronary artery.  Marland Kitchen HLD (hyperlipidemia)     Past Surgical History  Procedure Date  . Total knee arthroplasty   . Tonsillectomy   . Cholecystectomy     ROS:  As stated in the HPI and negative for all  other systems.   PHYSICAL EXAM BP 119/77  Pulse 66  Ht 5\' 9"  (1.753 m)  Wt 224 lb (101.606 kg)  BMI 33.08 kg/m2 GENERAL:  Well appearing HEENT:  Pupils equal round and reactive, fundi not visualized, oral mucosa unremarkable NECK:  No jugular venous distention, waveform within normal limits, carotid upstroke brisk and symmetric, no bruits, no thyromegaly LYMPHATICS:  No cervical, inguinal adenopathy LUNGS:  Clear to auscultation bilaterally BACK:  No CVA tenderness CHEST:  Unremarkable HEART:  PMI not displaced or sustained,S1 and S2 within normal limits, no S3, no S4, no clicks, no rubs, no murmurs ABD:  Flat, positive bowel sounds normal in frequency in pitch, no bruits, no rebound, no guarding, no midline pulsatile mass, no hepatomegaly, no splenomegaly EXT:  2 plus pulses throughout, no edema, no cyanosis no clubbing SKIN:  No rashes no nodules NEURO:  Cranial nerves II through XII grossly intact, motor grossly intact throughout PSYCH:  Cognitively intact, oriented to person place and time  EKG:  Sinus rhythm, rate 66, axis leftward, intervals within normal limits, no acute ST-T wave changes.  ASSESSMENT  AND PLAN

## 2010-11-19 NOTE — Patient Instructions (Signed)
Your physician wants you to follow-up in: ONE YEAR You will receive a reminder letter in the mail two months in advance. If you don't receive a letter, please call our office to schedule the follow-up appointment.  

## 2010-12-01 NOTE — Discharge Summary (Signed)
NAME:  Thomas Michael, Thomas Michael NO.:  [id number]   MEDICAL RECORD NO.:  [id number]          PATIENT TYPE:  INP   LOCATION:  2503                         FACILITY:  [facility]   PHYSICIAN:  Veverly Fells. Excell Seltzer, MD  DATE OF BIRTH:  April 23, 1941   DATE OF ADMISSION:  08/12/2008  DATE OF DISCHARGE:  08/14/2008                               DISCHARGE SUMMARY   PRIMARY CARDIOLOGIST:  Rollene Rotunda, MD, [facility]   INTERVENTIONAL CARDIOLOGIST:  Veverly Fells. Excell Seltzer, MD   PRIMARY CARE PHYSICIAN:  Emeterio Reeve, MD   PROCEDURES PERFORMED DURING HOSPITALIZATION:  Cardiac catheterization.  A:  PCI and stenting of the right coronary artery.  B.  Stenting of the left anterior descending artery.   FINAL DISCHARGE DIAGNOSES:  1. Unstable angina.  2. Coronary artery disease.  A:  Severe restenosis in the mid right coronary artery with an eccentric  95% lesion in that area and severe stenosis in the mid left anterior  descending of 80%.  1. Hypercholesterolemia.  2. Hypertension.   HOSPITAL COURSE:  This is a 70 year old male patient who has a history  of known coronary artery disease and stents, also had history of  syncope.  The patient had not been seen by a cardiologist in a while and  had been doing well.  However, he noticed exertional angina, came to the  emergency room secondary to this, describing the discomfort as a  substernal burning similar to the angina he had prior to stent  placement.  The patient was admitted to rule out myocardial infarction,  and cardiac catheterization was completed.   The patient had a cardiac catheterization on August 13, 2008, by Dr.  Rollene Rotunda revealing mid 99% stenosis of the right coronary artery  and 95% mid LAD.  The patient had subsequent PCI completed by Dr.  Tonny Bollman using a 3.5 x 18 Xience drug-eluting stent to reduce the  right coronary artery lesion from 95% to 0%.  The patient also had a 2.5  x 15 Xience drug-eluting stent placed  in the LAD to reduce it from 80%  to 0%.  The patient tolerated the procedure well and had no  complications.   The patient was seen and examined on followup by Dr. Tonny Bollman the  day of discharge and found to be stable.  The patient will return home  on Plavix and aspirin.  We have increased his Crestor to 20 mg daily and  the patient will continue taking Toprol and Diovan as he was taking  prior to admission.  The patient has been also advised on lifestyle  modifications concerning hypercholesterolemia with a low fat, low-  cholesterol diet teaching.   DISCHARGE LABORATORY DATA:  Sodium 136, potassium 3.8, chloride 101, CO2  of 26, BUN 9, creatinine 0.95, and glucose 128.  Hemoglobin 13.7,  hematocrit 41.3, white blood cells 8.1, and platelets 236.   VITAL SIGNS ON DISCHARGE:  Blood pressure 145/81, temperature 98, heart  rate 71, respirations 18, and O2 saturations 96% on room air.   DISCHARGE MEDICATIONS:  1. Crestor 20 mg at bedtime.  2. Aspirin 325 daily.  3. Synthroid 200 mcg daily.  4. Diovan 80 mg daily.  5. Toprol 50 mg daily.  6. Plavix 75 mg daily.  7. Nitroglycerin 0.4 mg sublingual p.r.n. chest discomfort.   ALLERGIES:  No known drug allergies.   FOLLOWUP PLANS AND APPOINTMENTS:  1. The patient will follow up with Dr. Rollene Rotunda on September 05, 2008, at 4:00 p.m. for continued cardiac management.  2. The patient will follow up with Dr. Paulino Rily for continued medical      management.  The patient is to call for an appointment.  3. The patient will be given post cardiac catheterization instructions      with particular emphasis on the right groin site for evidence of      bleeding, hematoma, and infection.  4. The patient has been advised to bring all medications to follow up      appointments with physician.   Time spent with the patient to include physician time 40 minutes.      Bettey Mare. Lyman Bishop, NP      Veverly Fells. Excell Seltzer, MD   Electronically Signed    KML/MEDQ  D:  08/14/2008  T:  08/15/2008  Job:  [id number]

## 2010-12-01 NOTE — Procedures (Signed)
NAME:  Thomas Michael, Thomas Michael NO.:  [id number]   MEDICAL RECORD NO.:  [id number]          PATIENT TYPE:  OUT   LOCATION:  SLEEP CENTER                 FACILITY:  [facility]   PHYSICIAN:  Barbaraann Share, MD,FCCPDATE OF BIRTH:  Jan 27, 1941   DATE OF STUDY:  09/23/2008                            NOCTURNAL POLYSOMNOGRAM   REFERRING PHYSICIAN:  Rollene Rotunda, MD, [facility]   INDICATION FOR STUDY:  Hypersomnia with sleep apnea.   EPWORTH SCORE:  18.   SLEEP ARCHITECTURE:  The patient had a total sleep time of 327 minutes  with no slow wave sleep and only a limited quantity of REM.  Sleep onset  latency was normal at 3.5 minutes, and REM onset was prolonged at 152  minutes.  Sleep efficiency was in the 76-81% range which is moderately  reduced.   RESPIRATORY DATA:  The patient underwent a split night protocol where he  was found to have 267 obstructive events in the first 147 minutes of  sleep.  This gave him an apnea-hypopnea index during the diagnostic  portion of 109 events per hour.  Events occurred in all body positions  and  loud snoring was noted throughout.  By protocol, the patient was  then placed on a medium ResMed Quattro full face mask, and ultimately  titrated to an optimal pressure of 12 cm of water in order to control  both his obstructive events and snoring.   OXYGEN DATA:  There was O2 desaturation as low as 68% with his  obstructive events.  His desaturation resolved with CPAP therapy.   CARDIAC DATA:  The patient had an occasional PAC and PVC, but no  clinically significant arrhythmias were noted.   MOVEMENTS/PARASOMNIA:  The patient had large numbers of leg jerks with 3  per hour resulting in arousal or awakening; however, these occurred in  cluster at the very end of the study.  I suspect this is more related to  his sleep disorder breathing than a true primary movement disorder of  sleep.  I would see how he responds to treatment of his severe sleep  apnea  first.   IMPRESSION/RECOMMENDATIONS:  1. Split night study reveals severe obstructive sleep apnea with an      apnea-hypopnea index of 109 events per hour during the diagnostic      portion, and oxygen desaturation as low as 68%.  He was then placed      on continuous positive airway pressure with a ResMed Quattro full-      face mask, and found to have an optimal pressure of 12 cm of water.      The patient should also be encouraged to work aggressively on      weight loss.  2. Occasional premature atrial contraction and premature ventricular      contraction seen without clinically significant arrhythmia noted.      Barbaraann Share, MD,FCCP  Diplomate, American Board of Sleep  Medicine  Electronically Signed     KMC/MEDQ  D:  09/28/2008 13:43:25  T:  09/29/2008 00:38:37  Job:  [id number]

## 2010-12-01 NOTE — H&P (Signed)
NAME:  Thomas Michael, Thomas Michael NO.:  [id number]   MEDICAL RECORD NO.:  [id number]          PATIENT TYPE:  INP   LOCATION:  3713                         FACILITY:  [facility]   PHYSICIAN:  Rollene Rotunda, MD, FACCDATE OF BIRTH:  [birth year]   DATE OF ADMISSION:  08/12/2008  DATE OF DISCHARGE:                              HISTORY & PHYSICAL   PRIMARY CARE PHYSICIAN:  Emeterio Reeve, MD   REASON OF PRESENTATION:  Evaluate the patient with chest pain suggestive  of unstable angina.   HISTORY OF PRESENT ILLNESS:  The patient is a pleasant 70 year old  gentleman who did see [place] in the past for treatment of coronary artery  disease with stents.  He also had a history of syncope and an EP  evaluation.  However, he has not seen a cardiologist quite awhile.  He  has been doing relatively well.  He has not particularly been taking  care of himself.  He has noticed over a few months decrease in exercise  tolerance.  He has had increased fatigue.  Over the last several days,  he has noticed chest discomfort with exertion.  This is when he does  something like walk up his along driveway.  He notices it with climbing  a flight of stairs.  He came to the emergency room today because this  discomfort was getting worse with exertion and coming on with less  exertion.  It is a burning substernal discomfort similar to the angina  he had an 2000 when he had stents.  Goes away a few minutes after he  stops what he is doing.  He has not had any associated nausea, vomiting,  or diaphoresis.  He has not had any palpitations, presyncope, or  syncope.  He does get dyspneic as described.  He does not have any PND  or orthopnea.  He is pain free at this moment.  Initial point-of-care  markers have been negative.  The EKG is nonacute.   PAST MEDICAL HISTORY:  1. Coronary artery disease (100% right coronary artery occlusion      status post stenting, 85% circumflex occlusion status post      stenting,  50% LAD stenosis managed in 2000 medically).  2. Syncope (the patient had a negative EP study.  He saw Dr. Ladona Ridgel      and had a loop implanted.  He had this for a couple of years.  He      actually had an event, but after the loop had stopped recording.      He had his last syncopal episode in 2007, and has had none since      then.  He has had his loop explanted).  3. Hypothyroidism.  4. Morbid obesity.  5. Hypertension.  6. Hyperlipidemia.   PAST SURGICAL HISTORY:  Arthroscopic knee surgery, tonsillectomy, and  cholecystectomy.   ALLERGIES OR INTOLERANCES:  None.   MEDICATIONS:  1. Crestor 10 mg daily.  2. Diovan 80 mg daily.  3. Toprol 50 mg daily.  4. Synthroid 200 mcg daily.  5. Aspirin 325 mg daily.   SOCIAL  HISTORY:  He is married.  He has children.  He rarely drinks  alcohol.  He has not smoked for many years, smoking from the ages of 73-  33.  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  He is about to downsize his industry which he thinks will  help with his lifestyle.   Family history is contributory for his father dying of myocardial  function at 46.  His sister died suddenly in her mid 58s, though was  unclear.  It was felt that this was myocardial infarction.   REVIEW OF SYSTEMS:  As stated in the HPI and positive for snoring,  daytime somnolence, positive twitching of his right eye (he has had an  extensive workup and it was felt this could have been treated with  Botox, but he has declined).   PHYSICAL EXAMINATION:  GENERAL:  The patient is pleasant and in no  distress.  VITAL SIGNS:  Blood pressure 177/85, heart rate 64 and regular,  respiratory rate 20, afebrile, 97% saturation on room air.  HEENT:  Eyelids are unremarkable.  Pupils are equal, round, and reactive  to light, fundi not well visualized, oral mucosa unremarkable.  NECK:  No jugular venous distention at 45 degrees, carotid upstroke  brisk and symmetrical, no  bruits, no thyromegaly.  LYMPHATIC:  No cervical, axillary, or inguinal adenopathy.  LUNGS:  Clear to auscultation bilaterally.  BACK:  No costovertebral angle tenderness.  CHEST:  Unremarkable.  HEART:  PMI not displaced or sustained, S1 and S2 within normal limits,  no S3, no S4, no clicks, no rubs, no murmurs.  ABDOMEN:  Morbidly obese, positive bowel sounds, normal in frequency and  pitch.  No bruits, no rebound, no guarding or midline pulsatile mass, no  hepatomegaly, no splenomegaly.  SKIN:  No rashes, no nodules.  EXTREMITIES:  2+ pulses throughout, no edema, no cyanosis, no clubbing.  NEUROLOGIC:  Oriented to person, place, and time.  Cranial nerves II  through XII grossly intact, motor grossly intact.   EKG:  Sinus rhythm, rate 74, axis within normal limits, intervals within  normal limits, no acute ST or T-wave changes.   LABS:  WBC 8.2, hemoglobin 14.4, platelets 248.  Point-of-care markers  negative x1.  Sodium 138, potassium 3.9, BUN 16, creatinine 0.96, BNP  31.   Chest x-ray; left anteropleural scarring, no active disease.   ASSESSMENT AND PLAN:  1. Unstable angina.  The patient has pain that this is clearly      unstable angina.  He needs cardiac catheterization.  He will be      treated with aspirin, heparin, and beta-blockers.  He has not      having any active pain, so I will not put any nitrates on.  He will      be started on nitrates if he does have any discomfort.  He      understands the catheterization.  He understands the risks and      benefits and agrees to proceed.  2. Obesity.  We will educate.  3. Dyslipidemia.  He will continue his Crestor and check a fasting      lipid profile.  The goal would be an LDL less than 100 and HDL      greater than 40.  4. Fatigued.  I think the patient has sleep apnea.  He snores.  He has      a body habitus.  We will get  an outpatient sleep study and we had a      long discussion about this.  5. Hypertension.  His  blood pressure will be managed with the meds as      listed above.  6. Hypothyroidism.  We will check a TSH.  Also check a hemoglobin A1c.      He will continue on his current Synthroid.      Rollene Rotunda, MD, [facility]  Electronically Signed     JH/MEDQ  D:  08/12/2008  T:  08/13/2008  Job:  [id number]   cc:   Emeterio Reeve, MD

## 2010-12-01 NOTE — Cardiovascular Report (Signed)
NAME:  Thomas Michael, Thomas Michael NO.:  [id number]   MEDICAL RECORD NO.:  [id number]          PATIENT TYPE:  INP   LOCATION:  2503                         FACILITY:  [facility]   PHYSICIAN:  Veverly Fells. Excell Seltzer, MD  DATE OF BIRTH:  [birth year]   DATE OF PROCEDURE:  DATE OF DISCHARGE:                            CARDIAC CATHETERIZATION   PROCEDURES:  1. Percutaneous transluminal coronary angioplasty and stenting of the      right coronary artery.  2. Stenting of the left anterior descending.  3. Perclose of the right femoral artery.   INDICATION:  [name] is a 70 year old gentleman who presented with  unstable angina.  He had stenting of the right coronary artery and left  circumflex approximately 10 years ago.  He has done well in the interim.  He had classic symptoms of unstable angina and underwent diagnostic  catheterization that showed severe restenosis in the mid right coronary  artery with an eccentric 95% lesion in that area.  He also has severe  stenosis in the mid LAD of 80%.  We elected to treat both lesions  percutaneously with planned use of drug-eluting stents.   There was a 5-French sheath in the right femoral artery.  This was  changed out for a 6-French sheath.  Angiomax was used for  anticoagulation.  The patient was preloaded with 600 mg of Plavix.  A 6-  [place] JR4 guide catheter was inserted into the right coronary artery.  A Cougar guidewire was used to wire the lesion.  A 2.5 x 15-mm Voyager  balloon was used to predilate the vessel to 10 atmospheres.  After  predilatation, a 3.5 x 18-mm XIENCE stent was carefully positioned to  cover the entire lesion.  The lesion began in the midportion of a  previously stented segment, but extended out of the stent into the  native vessel.  The stent was deployed at 16 atmospheres and appeared  well expanded.  It was then postdilated with a 3.75 x 15-mm Voyager [place]  balloon, which was taken to 16 atmospheres on the edges  of the stent and  18 atmospheres in the midportion.  Final angiography demonstrated an  excellent result with a widely patent vessel.  There was some mild  stenosis in the proximal vessel that appeared nonobstructive.  It did  have a hypodense appearance, but did not appear flow limiting.  That  area was left untreated.  Attention was then turned to the LAD.  A 6-  [place] CLS 3.5-cm guide catheter was used.  A 2.5 x 15-mm XIENCE stent  was placed and deployed at 12 atmospheres.  There was a residual waste  in the midportion.  It was postdilated with Voyager [place] balloon to 16  atmospheres on 2 inflations.  The patient had some chest pain after  stenting of the LAD and that was likely secondary to closure of the  septal perforator.  There was excellent stent expansion and TIMI III  flow down the native LAD.  The patient tolerated the procedure well.  A  Perclose device was used to close the femoral  arteriotomy at the  completion of the procedure.   FINAL CONCLUSION:  Successful stenting of severe in-stent restenosis in  the right coronary artery and severe de novo stenosis in the LAD.   RECOMMENDATIONS:  Recommend 12 months of dual antiplatelet therapy.      Veverly Fells. Excell Seltzer, MD  Electronically Signed     MDC/MEDQ  D:  08/13/2008  T:  08/14/2008  Job:  [id number]   cc:   Emeterio Reeve, MD

## 2010-12-01 NOTE — H&P (Signed)
NAME:  Thomas Michael, Thomas Michael NO.:  [id number]   MEDICAL RECORD NO.:  [id number]          PATIENT TYPE:  INP   LOCATION:  2503                         FACILITY:  [facility]   PHYSICIAN:  Rollene Rotunda, MD, FACCDATE OF BIRTH:  05-16-1941   DATE OF ADMISSION:  08/12/2008  DATE OF DISCHARGE:                              HISTORY & PHYSICAL   PRIMARY CARE PHYSICIAN:  Emeterio Reeve, MD   CARDIOLOGIST:  Rollene Rotunda, MD, [facility]   PROCEDURE:  Left heart catheterization/coronary arteriography.   INDICATION:  Evaluate patient with unstable angina and previous coronary  artery disease with stenting in 2000 of his right coronary artery and  circumflex.   PROCEDURE NOTE:  Left heart catheterization was performed via the right  femoral artery.  The artery was cannulated using anterior wall puncture.  A #5-French arterial sheath was inserted via the modified Seldinger  technique.  Preformed Judkins and a pigtail catheter were utilized.  Cannulated the left main with a Judkins 3.5.  The patient tolerated the  procedure well.   RESULTS:  Hemodynamics; LV 152/14, A1 58/92.  Coronaries; the left main  was normal.  The LAD was moderate size.  There is proximal 30% stenosis  after the first septal perforator.  There was a mid 95% stenosis after  the second diagonal.  First diagonal was moderate size and normal.  The  second diagonal was moderate size and normal.  The circumflex in the AV  groove had diffuse proximal 25% stenosis.  There was mid obtuse marginal  which was large and widely patent.  There was a stented area which was  patent.  The right coronary artery.  The right coronary artery is a very  large dominant vessel.  There was a proximal stent.  Within this  proximal segment, there was 30% stenosis.  At the distal edge of the  stent, there was 99% stenosis.  This extended outside of the stent.  There was distal 30% stenosis before the PDA.  The PDA was long and  large and  normal throughout.  Left ventriculogram.  The left  ventriculogram was obtained in the RAO projection.  The EF was 65% with  normal wall motion.   CONCLUSION:  Severe two-vessel coronary artery disease.  Well-preserved  left ventricular function.   PLAN:  The patient  will have percutaneous revascularization by Dr.  Excell Seltzer.  We will first treat the right coronary artery which is most  likely the culprit lesion.  We will then turn our attention to the LAD.      Rollene Rotunda, MD, [facility]  Electronically Signed     JH/MEDQ  D:  08/13/2008  T:  08/13/2008  Job:  [id number]   cc:   Emeterio Reeve, MD

## 2010-12-04 ENCOUNTER — Other Ambulatory Visit: Payer: Self-pay | Admitting: Urology

## 2010-12-04 ENCOUNTER — Encounter (HOSPITAL_COMMUNITY): Payer: Medicare Other

## 2010-12-04 LAB — CBC
HCT: 41.7 % (ref 39.0–52.0)
Hemoglobin: 13.6 g/dL (ref 13.0–17.0)
MCH: 28 pg (ref 26.0–34.0)
MCHC: 32.6 g/dL (ref 30.0–36.0)
RDW: 14.5 % (ref 11.5–15.5)

## 2010-12-04 LAB — BASIC METABOLIC PANEL
CO2: 25 mEq/L (ref 19–32)
Calcium: 9.7 mg/dL (ref 8.4–10.5)
Creatinine, Ser: 0.92 mg/dL (ref 0.4–1.5)
GFR calc Af Amer: >60 mL/min (ref >60)
GFR calc non Af Amer: >60 mL/min (ref >60)
Glucose, Bld: 77 mg/dL (ref 70–99)
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
## 2010-12-04 NOTE — Op Note (Signed)
NAME:  Thomas Michael, Thomas Michael NO.:  [id number]   MEDICAL RECORD NO.:  [id number]          PATIENT TYPE:  OIB   LOCATION:  2899                         FACILITY:  [facility]   PHYSICIAN:  Doylene Canning. Ladona Ridgel, M.D.  DATE OF BIRTH:  [birth year]   DATE OF PROCEDURE:  12/17/2005  DATE OF DISCHARGE:                                 OPERATIVE REPORT   INDICATIONS:  History of syncope status post insertion of implantable loop  recorder with the device now at elective replacement indication/end-of-life.   INTRODUCTION:  The patient is 65-year man who underwent insertion of  implantable loop recorder after a syncopal episode back in 2004.  He had no  recurrent syncope.  He is now referred for loop recorder removal.   PROCEDURE PERFORMED:  After informed consent was obtained, the patient is  taken diagnostic EP lab in fasting state.  After usual preparation and  draping, intravenous fentanyl and midazolam was given for sedation.  20 mL  of lidocaine was infiltrated into the left infraclavicular region over the  loop recorder insertion site.  A 3 cm incision was carried out over this  region.  Electrocautery utilized to dissect down the fascial plane.  The  loop recorder was removed without difficulty.  The pocket was irrigated with  kanamycin and the incision was closed with a layer of 2-0 Vicryl followed by  layer of 3-0 Vicryl followed by layer of 4-0 Vicryl.  Benzoin painted on the  skin, Steri-Strips were applied, and a pressure dressing was placed.  The  patient was returned to his room in satisfactory condition.   COMPLICATIONS:  There were no immediate procedure complications.   RESULTS:  Demonstrates successful removal of implantable loop recorder  without immediate procedure complication.           ______________________________  Doylene Canning. Ladona Ridgel, M.D.     GWT/MEDQ  D:  12/17/2005  T:  12/17/2005  Job:  [id number]   cc:   Vale Haven. Andrey Campanile, M.D.  Fax: [phone]

## 2010-12-10 ENCOUNTER — Other Ambulatory Visit: Payer: Self-pay | Admitting: Urology

## 2010-12-10 ENCOUNTER — Inpatient Hospital Stay (HOSPITAL_COMMUNITY)
Admission: RE | Admit: 2010-12-10 | Discharge: 2010-12-12 | DRG: 658 | Disposition: A | Discharge status: Home / Self Care | Payer: Medicare Other | Source: Ambulatory Visit | Attending: Urology | Admitting: Urology

## 2010-12-10 DIAGNOSIS — I10 Essential (primary) hypertension: Secondary | ICD-10-CM | POA: Diagnosis present

## 2010-12-10 DIAGNOSIS — I2510 Atherosclerotic heart disease of native coronary artery without angina pectoris: Secondary | ICD-10-CM | POA: Diagnosis present

## 2010-12-10 DIAGNOSIS — E039 Hypothyroidism, unspecified: Secondary | ICD-10-CM | POA: Diagnosis present

## 2010-12-10 DIAGNOSIS — C649 Malignant neoplasm of unspecified kidney, except renal pelvis: Principal | ICD-10-CM | POA: Diagnosis present

## 2010-12-10 DIAGNOSIS — G4733 Obstructive sleep apnea (adult) (pediatric): Secondary | ICD-10-CM | POA: Diagnosis present

## 2010-12-10 DIAGNOSIS — Z01812 Encounter for preprocedural laboratory examination: Secondary | ICD-10-CM

## 2010-12-10 DIAGNOSIS — I252 Old myocardial infarction: Secondary | ICD-10-CM

## 2010-12-10 DIAGNOSIS — Z9861 Coronary angioplasty status: Secondary | ICD-10-CM

## 2010-12-10 LAB — BASIC METABOLIC PANEL
BUN: 17 mg/dL (ref 6–23)
CO2: 24 mEq/L (ref 19–32)
GFR calc non Af Amer: >60 mL/min (ref >60)
Glucose, Bld: 154 mg/dL — ABNORMAL HIGH (ref 70–99)
Potassium: 4 mEq/L (ref 3.5–5.1)

## 2010-12-10 LAB — ABO/RH: ABO/RH(D): A POS

## 2010-12-10 LAB — HEMOGLOBIN AND HEMATOCRIT, BLOOD: Hemoglobin: 14.3 g/dL (ref 13.0–17.0)

## 2010-12-10 NOTE — Op Note (Signed)
NAME:  Thomas Michael, Thomas Michael NO.:  [id number]  MEDICAL RECORD NO.:  [id number]           PATIENT TYPE:  I  LOCATION:  0004                         FACILITY:  [facility]  PHYSICIAN:  Heloise Purpura, MD      DATE OF BIRTH:  1941-04-12  DATE OF PROCEDURE:  12/10/2010 DATE OF DISCHARGE:                              OPERATIVE REPORT   PREOPERATIVE DIAGNOSIS:  Right renal neoplasm.  POSTOPERATIVE DIAGNOSIS:  Right renal neoplasm.  PROCEDURE:  Right laparoscopic radical nephrectomy.  SURGEON:  Heloise Purpura, MD  ASSISTANT:  Delia Chimes, NP  ANESTHESIA:  General.  COMPLICATIONS:  None.  ESTIMATED BLOOD LOSS:  100 mL.  INTRAVENOUS FLUIDS:  3 liters of crystalloid.  SPECIMEN:  Right kidney and proximal ureter.  DISPOSITION:  Specimen to pathology.  INTRAOPERATIVE FINDINGS:  Intraoperative pathology consultation was obtained for identification of the tumor type based on the concern of renal cell carcinoma versus urothelial carcinoma.  It was indeed determined that the patient had findings consistent with renal cell carcinoma intraoperatively.  INDICATION:  [name] is a 70 year old gentleman who presented with gross hematuria.  He underwent a full evaluation including CT imaging as well as cystoscopy and urine cytology.  He was found to have a large right renal mass with extension centrally toward the renal hilum. Findings were most consistent with a renal cell carcinoma, although there was potential concern for urothelial carcinoma.  We discussed the options for management and the potential treatments involve pending the tumor type.  The potential risks, complications, and alternative treatment options were discussed in detail and informed consent was obtained.  DESCRIPTION OF PROCEDURE:  The patient was taken to the operating room and a general anesthetic was administered.  He was given preoperative antibiotics, placed in the right modified flank position, and  prepped and draped in the usual sterile fashion.  Next, the site was selected just superior to the umbilicus for placement of the camera port.  This was placed using an open Hasson technique.  On entering the rectus fascia, digital palpation revealed the possibility of intra-abdominal adhesions.  Notably, the patient had a right upper quadrant subcostal incision from a prior open cholecystectomy.  Due to these concerns, a 12- mm port was then placed in the right lower quadrant using the OptiVu under direct vision was inserted into the peritoneal cavity. Pneumoperitoneum was established.  There did not appear to be extensive adhesions and the previous periumbilical site was then used to place an additional 12-mm port.  5-mm port was placed in the right upper quadrant and an additional 5-mm port was placed in the far right lateral abdominal wall for liver retraction.  A self-retaining liver retractor was placed and adhesions between the peritoneum and liver were carefully taken down in the gallbladder fossa.  Once the liver was able to be retracted cephalad, the white line of Toldt was incised along the length of the ascending colon allowing the colon be mobilized medially in the space between the mesocolon and the anterior layer of Gerota's fascia to be developed.  The ureter and gonadal vein were identified and the ureter  was able to be lifted anteriorly off the psoas muscle way from the gonadal vein.  Dissection then proceeded superiorly towards the renal hilum.  There was noted to be a large main renal vein with 2 large tributaries draining into the main vein.  In addition, there were noted to be 2 renal arteries on preoperative imaging studies in the lower pole.  Renal artery was able to be easily isolated with right-angle dissection and divided between multiple 10-mm Hem-o-Lok clips.  The upper pole renal artery was identified just posterior and superior to the renal vein was somewhat  of a difficult location to dissect away from the bifurcating renal vein.  This was able to be eventually isolated with right-angle dissection and a 10-mm Hem-o-Lok clip was placed on the renal artery to ligate it.  Prior to division of this renal artery and due to the difficult location of this renal artery, it was decided to proceed with ligation of the renal vein.  45-mm Flex ETS stapler was used to staple and divide the renal vein with a vascular staple load. The remaining renal artery was then identified and additional 10-mm Hem- o-Lok clips were placed and the renal artery was then divided.  The hepatorenal ligaments were then divided with the harmonic scalpel and the lateral and posterior attachments were also divided with a harmonic scalpel, allowing the kidney to be freed on the ureter.  The ureter was dissected distally for some distance in preparation for a possible ureterectomy if necessary.  This stump was then left in place and the ureter was divided more proximally between 10-mm Hem-o-Lok clips.  15-mm EndoCatch bag was then placed through the original camera port site and 0 Vicryl sutures had been preplaced into the fascia of the other port sites for later closure.  The kidney was then removed intact via the periumbilical port site which was extended in a periumbilical fashion just large enough to allow removal of the kidney.  The kidney was then sent as a fresh specimen to the pathology lab and was examined by pathology.  It was confirmed that this tumor did appear consistent with a renal cell carcinoma and therefore, preparations were made for closure.  The renal fossa was irrigated and hemostasis ensured.  The pneumoperitoneum was let down and still there appeared to be excellent hemostasis.  The liver retractor was removed and the port sites were all removed under direct vision with 12-mm port sites closed with the preplaced 0 Vicryl sutures.  The periumbilical port site  had been closed with 2 running #1 PDS sutures.  All port sites were injected with 0.25% Marcaine and reapproximated to the skin level with 4-0 Monocryl subcuticular closures.  Dermabond was applied to the skin.  He tolerated the procedure well without complications.  He was able to be extubated and transferred to the recovery unit in satisfactory condition.     Heloise Purpura, MD     LB/MEDQ  D:  12/10/2010  T:  12/10/2010  Job:  [id number]  Electronically Signed by Heloise Purpura MD on 12/10/2010 11:33:11 PM

## 2010-12-11 LAB — BASIC METABOLIC PANEL
CO2: 24 mEq/L (ref 19–32)
Calcium: 8.5 mg/dL (ref 8.4–10.5)
Chloride: 97 mEq/L (ref 96–112)
Glucose, Bld: 124 mg/dL — ABNORMAL HIGH (ref 70–99)
Sodium: 132 mEq/L — ABNORMAL LOW (ref 135–145)

## 2010-12-21 NOTE — Discharge Summary (Signed)
  NAME:  Thomas Michael, Thomas Michael NO.:  [id number]  MEDICAL RECORD NO.:  [id number]           PATIENT TYPE:  I  LOCATION:  1423                         FACILITY[facility]  PHYSICIAN:  Heloise Purpura, MD      DATE OF BIRTH:  1940/07/24  DATE OF ADMISSION:  12/10/2010 DATE OF DISCHARGE:  12/12/2010                              DISCHARGE SUMMARY   ADMISSION DIAGNOSIS:  Right renal neoplasm.  DISCHARGE DIAGNOSIS:  Renal cell carcinoma.  HISTORY AND PHYSICAL:  For full details, please see admission history and physical.  Briefly, Thomas Michael is a 70 year old gentleman who was found to have a large centrally located right renal neoplasm concerning for renal malignancy.  He underwent a full evaluation for hematuria which was otherwise negative.  He also underwent metastatic evaluation which was negative.  After discussing treatment options, he elected to proceed with a right laparoscopic radical nephrectomy.  Due to the central location of his tumor, it was decided to proceed with intraoperative pathologic analysis to exclude urothelial carcinoma.  He consented and agreed to proceed as planned.  HOSPITAL COURSE:  On Dec 10, 2010, he was taken to the operating room and underwent a right laparoscopic radical nephrectomy.  His kidney was sent to the pathology lab intraoperatively and it was confirmed that he did indeed have a renal cell carcinoma.  He tolerated his surgical procedure well and without complications and was able to be transferred to hospital room following recovery from anesthesia.  Postoperatively, he remained hemodynamically stable.  He was able to begin ambulating the night of surgery and his hemoglobin remained stable at 13.7 on postoperative day #1.  His renal function also remained relatively stable with a creatinine of 1.38 on postoperative day #1.  He was gradually able to be transitioned to oral pain medication and his diet was advanced to a regular diet on  the evening of postoperative day #1. He maintained excellent urine output and his Foley catheter was removed. On the morning of postoperative day #2, he was stable and felt to be okay for discharge home.  DISPOSITION:  Home.  DISCHARGE MEDICATIONS:  He was instructed to resume all his regular home medications except Plavix and was told to restart this in 2 days assuming no hematuria or bleeding from other sites.  He was given a prescription to take Percocet as needed for pain and Colace as a stool softener.  DISCHARGE INSTRUCTIONS:  He was instructed to be ambulatory but specifically told to refrain from any heavy lifting, strenuous activity, or driving.  FOLLOWUP:  He will follow up as scheduled for postoperative evaluation in 3 to 4 weeks.  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     Heloise Purpura, MD     LB/MEDQ  D:  12/16/2010  T:  12/16/2010  Job:  [id number]  Electronically Signed by Heloise Purpura MD on 12/21/2010 08:09:08 PM

## 2011-06-02 ENCOUNTER — Other Ambulatory Visit (HOSPITAL_COMMUNITY): Payer: Self-pay | Admitting: Urology

## 2011-06-02 ENCOUNTER — Ambulatory Visit (HOSPITAL_COMMUNITY)
Admission: RE | Admit: 2011-06-02 | Discharge: 2011-06-02 | Disposition: A | Discharge status: Home / Self Care | Payer: Medicare Other | Source: Ambulatory Visit | Attending: Urology | Admitting: Urology

## 2011-06-02 DIAGNOSIS — Z85528 Personal history of other malignant neoplasm of kidney: Secondary | ICD-10-CM | POA: Insufficient documentation

## 2011-06-02 DIAGNOSIS — Z87891 Personal history of nicotine dependence: Secondary | ICD-10-CM | POA: Insufficient documentation

## 2011-06-02 DIAGNOSIS — C649 Malignant neoplasm of unspecified kidney, except renal pelvis: Secondary | ICD-10-CM

## 2011-08-12 DIAGNOSIS — G518 Other disorders of facial nerve: Secondary | ICD-10-CM | POA: Diagnosis not present

## 2011-09-10 DIAGNOSIS — G4700 Insomnia, unspecified: Secondary | ICD-10-CM | POA: Diagnosis not present

## 2011-10-13 DIAGNOSIS — G479 Sleep disorder, unspecified: Secondary | ICD-10-CM | POA: Diagnosis not present

## 2011-11-10 DIAGNOSIS — G518 Other disorders of facial nerve: Secondary | ICD-10-CM | POA: Diagnosis not present

## 2011-11-24 ENCOUNTER — Other Ambulatory Visit: Payer: Self-pay | Admitting: Urology

## 2011-11-24 DIAGNOSIS — C649 Malignant neoplasm of unspecified kidney, except renal pelvis: Secondary | ICD-10-CM

## 2011-12-08 DIAGNOSIS — C649 Malignant neoplasm of unspecified kidney, except renal pelvis: Secondary | ICD-10-CM | POA: Diagnosis not present

## 2011-12-14 ENCOUNTER — Other Ambulatory Visit: Payer: Medicare Other

## 2011-12-15 ENCOUNTER — Ambulatory Visit
Admission: RE | Admit: 2011-12-15 | Discharge: 2011-12-15 | Disposition: A | Discharge status: Home / Self Care | Payer: Medicare Other | Source: Ambulatory Visit | Attending: Urology | Admitting: Urology

## 2011-12-15 DIAGNOSIS — C649 Malignant neoplasm of unspecified kidney, except renal pelvis: Secondary | ICD-10-CM | POA: Diagnosis not present

## 2011-12-15 DIAGNOSIS — Z905 Acquired absence of kidney: Secondary | ICD-10-CM | POA: Diagnosis not present

## 2011-12-15 DIAGNOSIS — K449 Diaphragmatic hernia without obstruction or gangrene: Secondary | ICD-10-CM | POA: Diagnosis not present

## 2011-12-15 MED ORDER — GADOXETATE DISODIUM 0.25 MMOL/ML IV SOLN
5.0000 mL | Freq: Once | INTRAVENOUS | Status: AC | PRN
  Administered 2011-12-15: 5 mL via INTRAVENOUS

## 2011-12-17 ENCOUNTER — Other Ambulatory Visit: Payer: Self-pay | Admitting: Urology

## 2011-12-17 ENCOUNTER — Ambulatory Visit (HOSPITAL_COMMUNITY)
Admission: RE | Admit: 2011-12-17 | Discharge: 2011-12-17 | Disposition: A | Discharge status: Home / Self Care | Payer: Medicare Other | Source: Ambulatory Visit | Attending: Urology | Admitting: Urology

## 2011-12-17 DIAGNOSIS — Z905 Acquired absence of kidney: Secondary | ICD-10-CM | POA: Diagnosis not present

## 2011-12-17 DIAGNOSIS — C649 Malignant neoplasm of unspecified kidney, except renal pelvis: Secondary | ICD-10-CM

## 2012-02-09 DIAGNOSIS — G518 Other disorders of facial nerve: Secondary | ICD-10-CM | POA: Diagnosis not present

## 2012-03-14 DIAGNOSIS — M25569 Pain in unspecified knee: Secondary | ICD-10-CM | POA: Diagnosis not present

## 2012-05-11 DIAGNOSIS — G518 Other disorders of facial nerve: Secondary | ICD-10-CM | POA: Diagnosis not present

## 2012-06-06 ENCOUNTER — Other Ambulatory Visit (HOSPITAL_COMMUNITY): Payer: Self-pay | Admitting: Urology

## 2012-06-06 ENCOUNTER — Ambulatory Visit (HOSPITAL_COMMUNITY)
Admission: RE | Admit: 2012-06-06 | Discharge: 2012-06-06 | Disposition: A | Discharge status: Home / Self Care | Payer: Medicare Other | Source: Ambulatory Visit | Attending: Urology | Admitting: Urology

## 2012-06-06 DIAGNOSIS — C649 Malignant neoplasm of unspecified kidney, except renal pelvis: Secondary | ICD-10-CM

## 2012-06-14 DIAGNOSIS — C649 Malignant neoplasm of unspecified kidney, except renal pelvis: Secondary | ICD-10-CM | POA: Diagnosis not present

## 2012-06-28 DIAGNOSIS — M25569 Pain in unspecified knee: Secondary | ICD-10-CM | POA: Diagnosis not present

## 2012-07-07 DIAGNOSIS — I10 Essential (primary) hypertension: Secondary | ICD-10-CM | POA: Diagnosis not present

## 2012-07-07 DIAGNOSIS — Z125 Encounter for screening for malignant neoplasm of prostate: Secondary | ICD-10-CM | POA: Diagnosis not present

## 2012-07-07 DIAGNOSIS — Z0000 Encounter for general adult medical examination without abnormal findings: Secondary | ICD-10-CM | POA: Diagnosis not present

## 2012-07-07 DIAGNOSIS — E782 Mixed hyperlipidemia: Secondary | ICD-10-CM | POA: Diagnosis not present

## 2012-07-07 DIAGNOSIS — E119 Type 2 diabetes mellitus without complications: Secondary | ICD-10-CM | POA: Diagnosis not present

## 2012-07-07 DIAGNOSIS — M109 Gout, unspecified: Secondary | ICD-10-CM | POA: Diagnosis not present

## 2012-07-07 DIAGNOSIS — Z79899 Other long term (current) drug therapy: Secondary | ICD-10-CM | POA: Diagnosis not present

## 2012-07-07 DIAGNOSIS — E039 Hypothyroidism, unspecified: Secondary | ICD-10-CM | POA: Diagnosis not present

## 2012-09-04 DIAGNOSIS — J069 Acute upper respiratory infection, unspecified: Secondary | ICD-10-CM | POA: Diagnosis not present

## 2012-09-04 DIAGNOSIS — E039 Hypothyroidism, unspecified: Secondary | ICD-10-CM | POA: Diagnosis not present

## 2012-09-04 DIAGNOSIS — H9209 Otalgia, unspecified ear: Secondary | ICD-10-CM | POA: Diagnosis not present

## 2012-10-30 DIAGNOSIS — M1710 Unilateral primary osteoarthritis, unspecified knee: Secondary | ICD-10-CM | POA: Diagnosis not present

## 2012-11-10 ENCOUNTER — Ambulatory Visit (INDEPENDENT_AMBULATORY_CARE_PROVIDER_SITE_OTHER): Payer: Medicare Other | Admitting: Neurology

## 2012-11-10 DIAGNOSIS — G518 Other disorders of facial nerve: Secondary | ICD-10-CM | POA: Diagnosis not present

## 2012-11-10 DIAGNOSIS — G5139 Clonic hemifacial spasm, unspecified: Secondary | ICD-10-CM

## 2012-11-10 MED ORDER — INCOBOTULINUMTOXINA 50 UNITS IM SOLR
50.0000 [IU] | Freq: Once | INTRAMUSCULAR | Status: AC
  Administered 2012-11-10: 50 [IU] via INTRAMUSCULAR

## 2012-11-10 NOTE — Progress Notes (Signed)
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   Since 2002, patient has had intermittent right eye and right facial twitching. This has gradually worsened over time. He was evaluated   in 2004 and diagnosed with hemifacial spasm. He had second opinion at [facility] with "extensive testing".  He tried and failed klonopin.  Ultimately he received botulinum toxin injection by Dr. Thad Ranger in 2008, with excellent results. The benefit lasted for 4 months. Unfortunately his insurance expired and he was no longer able to receive this treatment.  Now patient is having persistent symptoms with significant impairment of his vision, ability to read, ability to use a computer, and ability to drive.  UPDATE April 25th 2014:  Last EMG guided injection was in Jan 2014, he did very well, received 30 units, no significant side effects, noticed recurrence of right eye twitching over the past few days  Physical Exam  General: Patient is awake, alert and in no acute distress.  Well developed and groomed. Neck: Neck is supple. Cardiovascular: No carotid artery bruits.  Heart is regular rate and rhythm with no murmurs.  Neurologic Exam  Mental Status: Awake, alert Cranial Nerves: He has occaional right eye twitching, mild static right ptosis,  Motor: Normal bulk and tone.   Sensory: . Coordination: No ataxia  Gait and Station: Narrow based gait and steady Reflexes: Deep tendon reflexes in the upper and lower extremity are trace and symmetric.   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  50 units of Xeomin was dissolved into 1 cc of NS (Lot No. [id number], Exp 08 2014). used 25 units, discarded 25 units.  Right oribularis oculi at   4 ,6, 7,8,9,  (2.5unit each)= 12.5 Right  corrugate 2.5 Left corrugate 5 Procerus 2. 5  Left orbicularis oculi 2.5   Return to clinic in 3 months for repeat injections

## 2012-11-29 DIAGNOSIS — H25099 Other age-related incipient cataract, unspecified eye: Secondary | ICD-10-CM | POA: Diagnosis not present

## 2012-11-29 DIAGNOSIS — H2510 Age-related nuclear cataract, unspecified eye: Secondary | ICD-10-CM | POA: Diagnosis not present

## 2012-11-29 DIAGNOSIS — H52229 Regular astigmatism, unspecified eye: Secondary | ICD-10-CM | POA: Diagnosis not present

## 2012-11-29 DIAGNOSIS — H5210 Myopia, unspecified eye: Secondary | ICD-10-CM | POA: Diagnosis not present

## 2012-12-05 ENCOUNTER — Other Ambulatory Visit (HOSPITAL_COMMUNITY): Payer: Self-pay | Admitting: Urology

## 2012-12-05 DIAGNOSIS — C649 Malignant neoplasm of unspecified kidney, except renal pelvis: Secondary | ICD-10-CM

## 2013-01-05 DIAGNOSIS — M109 Gout, unspecified: Secondary | ICD-10-CM | POA: Diagnosis not present

## 2013-01-05 DIAGNOSIS — E782 Mixed hyperlipidemia: Secondary | ICD-10-CM | POA: Diagnosis not present

## 2013-01-05 DIAGNOSIS — C649 Malignant neoplasm of unspecified kidney, except renal pelvis: Secondary | ICD-10-CM | POA: Diagnosis not present

## 2013-01-05 DIAGNOSIS — E039 Hypothyroidism, unspecified: Secondary | ICD-10-CM | POA: Diagnosis not present

## 2013-01-05 DIAGNOSIS — E1129 Type 2 diabetes mellitus with other diabetic kidney complication: Secondary | ICD-10-CM | POA: Diagnosis not present

## 2013-01-05 DIAGNOSIS — I10 Essential (primary) hypertension: Secondary | ICD-10-CM | POA: Diagnosis not present

## 2013-01-05 DIAGNOSIS — Z79899 Other long term (current) drug therapy: Secondary | ICD-10-CM | POA: Diagnosis not present

## 2013-01-09 ENCOUNTER — Ambulatory Visit (HOSPITAL_COMMUNITY)
Admission: RE | Admit: 2013-01-09 | Discharge: 2013-01-09 | Disposition: A | Discharge status: Home / Self Care | Payer: Medicare Other | Source: Ambulatory Visit | Attending: Urology | Admitting: Urology

## 2013-01-09 ENCOUNTER — Other Ambulatory Visit (HOSPITAL_COMMUNITY): Payer: Self-pay | Admitting: Urology

## 2013-01-09 DIAGNOSIS — I10 Essential (primary) hypertension: Secondary | ICD-10-CM | POA: Diagnosis not present

## 2013-01-09 DIAGNOSIS — C649 Malignant neoplasm of unspecified kidney, except renal pelvis: Secondary | ICD-10-CM | POA: Diagnosis not present

## 2013-01-09 DIAGNOSIS — Z87891 Personal history of nicotine dependence: Secondary | ICD-10-CM | POA: Diagnosis not present

## 2013-01-12 ENCOUNTER — Ambulatory Visit (HOSPITAL_COMMUNITY): Admission: RE | Admit: 2013-01-12 | Payer: Medicare Other | Source: Ambulatory Visit

## 2013-01-18 ENCOUNTER — Ambulatory Visit (HOSPITAL_COMMUNITY)
Admission: RE | Admit: 2013-01-18 | Discharge: 2013-01-18 | Disposition: A | Discharge status: Home / Self Care | Payer: Medicare Other | Source: Ambulatory Visit | Attending: Urology | Admitting: Urology

## 2013-01-18 DIAGNOSIS — K449 Diaphragmatic hernia without obstruction or gangrene: Secondary | ICD-10-CM | POA: Insufficient documentation

## 2013-01-18 DIAGNOSIS — I700 Atherosclerosis of aorta: Secondary | ICD-10-CM | POA: Diagnosis not present

## 2013-01-18 DIAGNOSIS — Z9089 Acquired absence of other organs: Secondary | ICD-10-CM | POA: Insufficient documentation

## 2013-01-18 DIAGNOSIS — C649 Malignant neoplasm of unspecified kidney, except renal pelvis: Secondary | ICD-10-CM | POA: Insufficient documentation

## 2013-01-18 DIAGNOSIS — K7689 Other specified diseases of liver: Secondary | ICD-10-CM | POA: Insufficient documentation

## 2013-01-18 DIAGNOSIS — M5380 Other specified dorsopathies, site unspecified: Secondary | ICD-10-CM | POA: Diagnosis not present

## 2013-01-18 DIAGNOSIS — N281 Cyst of kidney, acquired: Secondary | ICD-10-CM | POA: Insufficient documentation

## 2013-01-18 DIAGNOSIS — Z905 Acquired absence of kidney: Secondary | ICD-10-CM | POA: Insufficient documentation

## 2013-01-18 MED ORDER — GADOBENATE DIMEGLUMINE 529 MG/ML IV SOLN
20.0000 mL | Freq: Once | INTRAVENOUS | Status: AC | PRN
  Administered 2013-01-18: 20 mL via INTRAVENOUS

## 2013-01-21 DIAGNOSIS — I10 Essential (primary) hypertension: Secondary | ICD-10-CM | POA: Diagnosis not present

## 2013-01-21 DIAGNOSIS — I2510 Atherosclerotic heart disease of native coronary artery without angina pectoris: Secondary | ICD-10-CM | POA: Diagnosis not present

## 2013-01-21 DIAGNOSIS — Z9861 Coronary angioplasty status: Secondary | ICD-10-CM | POA: Diagnosis not present

## 2013-01-21 DIAGNOSIS — S61209A Unspecified open wound of unspecified finger without damage to nail, initial encounter: Secondary | ICD-10-CM | POA: Diagnosis not present

## 2013-01-21 DIAGNOSIS — Z79899 Other long term (current) drug therapy: Secondary | ICD-10-CM | POA: Diagnosis not present

## 2013-01-25 DIAGNOSIS — Z85528 Personal history of other malignant neoplasm of kidney: Secondary | ICD-10-CM | POA: Diagnosis not present

## 2013-02-01 DIAGNOSIS — M25569 Pain in unspecified knee: Secondary | ICD-10-CM | POA: Diagnosis not present

## 2013-02-08 ENCOUNTER — Encounter: Payer: Self-pay | Admitting: Neurology

## 2013-02-08 ENCOUNTER — Ambulatory Visit (INDEPENDENT_AMBULATORY_CARE_PROVIDER_SITE_OTHER): Payer: Medicare Other | Admitting: Neurology

## 2013-02-08 VITALS — BP 135/89 | HR 80 | Ht 67.0 in | Wt 172.0 lb

## 2013-02-08 DIAGNOSIS — I6789 Other cerebrovascular disease: Secondary | ICD-10-CM | POA: Insufficient documentation

## 2013-02-08 DIAGNOSIS — G519 Disorder of facial nerve, unspecified: Secondary | ICD-10-CM

## 2013-02-08 DIAGNOSIS — G518 Other disorders of facial nerve: Secondary | ICD-10-CM

## 2013-02-08 DIAGNOSIS — G5139 Clonic hemifacial spasm, unspecified: Secondary | ICD-10-CM | POA: Insufficient documentation

## 2013-02-08 DIAGNOSIS — H02409 Unspecified ptosis of unspecified eyelid: Secondary | ICD-10-CM | POA: Insufficient documentation

## 2013-02-08 MED ORDER — INCOBOTULINUMTOXINA 50 UNITS IM SOLR
50.0000 [IU] | Freq: Once | INTRAMUSCULAR | Status: AC
  Administered 2013-02-08: 50 [IU] via INTRAMUSCULAR

## 2013-02-08 NOTE — Progress Notes (Signed)
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   Since 2002, patient has had intermittent right eye and right facial twitching. This has gradually worsened over time. He was evaluated   in 2004 and diagnosed with hemifacial spasm. He had second opinion at [facility] with "extensive testing".  He tried and failed klonopin.  Ultimately he received botulinum toxin injection by Dr. Thad Ranger in 2008, with excellent results. The benefit lasted for 4 months. Unfortunately his insurance expired and he was no longer able to receive this treatment.  Now patient is having persistent symptoms with significant impairment of his vision, ability to read, ability to use a computer, and ability to drive.  UPDATE July 2th 2014:  Last EMG guided injection was in April 2014, he did very well, received 30 units, no significant side effects, noticed mild recurrence of his right eye twitching over the past few days  Physical Exam  General: Patient is awake, alert and in no acute distress.  Well developed and groomed. Neck: Neck is supple. Cardiovascular: No carotid artery bruits.  Heart is regular rate and rhythm with no murmurs.  Neurologic Exam  Mental Status: Awake, alert Cranial Nerves: He has occaional right eye twitching, mild static right ptosis,  Motor: Normal bulk and tone.   Sensory: . Coordination: No ataxia  Gait and Station: Narrow based gait and steady Reflexes: Deep tendon reflexes in the upper and lower extremity are trace and symmetric.   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  50 units of Xeomin was dissolved into 1 cc of NS (Lot No. [id number], Exp 03 2015). used 35 units, discarded 15 units.  Right oribularis oculi at   4 ,6, 7,8 ,9,  (2.5unit each)=  12.5  Right corrugate 5 Left corrugate 5 Procerus 2.5  Right frontalis 2.5x2=5    Return to clinic in 3 months for repeat injections

## 2013-02-12 ENCOUNTER — Ambulatory Visit: Payer: Medicare Other | Admitting: Neurology

## 2013-03-16 DIAGNOSIS — E039 Hypothyroidism, unspecified: Secondary | ICD-10-CM | POA: Diagnosis not present

## 2013-05-11 DIAGNOSIS — M1710 Unilateral primary osteoarthritis, unspecified knee: Secondary | ICD-10-CM | POA: Diagnosis not present

## 2013-05-15 ENCOUNTER — Encounter: Payer: Self-pay | Admitting: Neurology

## 2013-05-15 ENCOUNTER — Ambulatory Visit (INDEPENDENT_AMBULATORY_CARE_PROVIDER_SITE_OTHER): Payer: Medicare Other | Admitting: Neurology

## 2013-05-15 VITALS — BP 150/97 | HR 69 | Ht 68.0 in | Wt 273.0 lb

## 2013-05-15 DIAGNOSIS — G518 Other disorders of facial nerve: Secondary | ICD-10-CM | POA: Diagnosis not present

## 2013-05-15 DIAGNOSIS — I10 Essential (primary) hypertension: Secondary | ICD-10-CM

## 2013-05-15 DIAGNOSIS — G4733 Obstructive sleep apnea (adult) (pediatric): Secondary | ICD-10-CM

## 2013-05-15 DIAGNOSIS — I2510 Atherosclerotic heart disease of native coronary artery without angina pectoris: Secondary | ICD-10-CM

## 2013-05-15 DIAGNOSIS — E7800 Pure hypercholesterolemia, unspecified: Secondary | ICD-10-CM

## 2013-05-15 DIAGNOSIS — E039 Hypothyroidism, unspecified: Secondary | ICD-10-CM

## 2013-05-15 DIAGNOSIS — G519 Disorder of facial nerve, unspecified: Secondary | ICD-10-CM

## 2013-05-15 MED ORDER — INCOBOTULINUMTOXINA 50 UNITS IM SOLR
50.0000 [IU] | Freq: Once | INTRAMUSCULAR | Status: AC
  Administered 2013-05-15: 50 [IU] via INTRAMUSCULAR

## 2013-05-15 NOTE — Progress Notes (Signed)
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   Since 2002, patient has had intermittent right eye and right facial twitching. This has gradually worsened over time. He was evaluated   in 2004 and diagnosed with hemifacial spasm. He had second opinion at [facility] with "extensive testing".  He tried and failed klonopin.  Ultimately he received botulinum toxin injection by Dr. Thad Ranger in 2008, with excellent results. The benefit lasted for 4 months. Unfortunately his insurance expired and he was no longer able to receive this treatment.  Now patient is having persistent symptoms with significant impairment of his vision, ability to read, ability to use a computer, and ability to drive.  UPDATE Oct 28th 2014:  Last EMG guided injection was in July 2014, he did very well, received 35 units, no significant side effects, noticed mild recurrence of his right eye twitching over the past few days  Physical Exam  General: Patient is awake, alert and in no acute distress.  Well developed and groomed. Neck: Neck is supple. Cardiovascular: No carotid artery bruits.  Heart is regular rate and rhythm with no murmurs.  Neurologic Exam  Mental Status: Awake, alert Cranial Nerves: He has occaional right eye twitching, mild static right ptosis,  Motor: Normal bulk and tone.   Sensory: . Coordination: No ataxia  Gait and Station: Narrow based gait and steady Reflexes: Deep tendon reflexes in the upper and lower extremity are trace and symmetric.   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  50 units of Xeomin was dissolved into 1 cc of NS (Lot No. [id number], Exp 16-1096). used 20 units, discarded 30 units.  Right oribularis oculi at   4 ,6, 7,8 ,9,  (2.5unit each)=  12.5  Right corrugate 5 Left corrugate 5 Procerus 2.5   Return to clinic in 3 months for repeat injections

## 2013-07-16 DIAGNOSIS — E039 Hypothyroidism, unspecified: Secondary | ICD-10-CM | POA: Diagnosis not present

## 2013-07-16 DIAGNOSIS — Z0000 Encounter for general adult medical examination without abnormal findings: Secondary | ICD-10-CM | POA: Diagnosis not present

## 2013-07-16 DIAGNOSIS — E782 Mixed hyperlipidemia: Secondary | ICD-10-CM | POA: Diagnosis not present

## 2013-07-16 DIAGNOSIS — I10 Essential (primary) hypertension: Secondary | ICD-10-CM | POA: Diagnosis not present

## 2013-07-16 DIAGNOSIS — E1129 Type 2 diabetes mellitus with other diabetic kidney complication: Secondary | ICD-10-CM | POA: Diagnosis not present

## 2013-07-16 DIAGNOSIS — Z125 Encounter for screening for malignant neoplasm of prostate: Secondary | ICD-10-CM | POA: Diagnosis not present

## 2013-07-16 DIAGNOSIS — Z79899 Other long term (current) drug therapy: Secondary | ICD-10-CM | POA: Diagnosis not present

## 2013-08-14 DIAGNOSIS — IMO0002 Reserved for concepts with insufficient information to code with codable children: Secondary | ICD-10-CM | POA: Diagnosis not present

## 2013-08-14 DIAGNOSIS — M1710 Unilateral primary osteoarthritis, unspecified knee: Secondary | ICD-10-CM | POA: Diagnosis not present

## 2013-08-14 DIAGNOSIS — M25569 Pain in unspecified knee: Secondary | ICD-10-CM | POA: Diagnosis not present

## 2013-08-15 ENCOUNTER — Ambulatory Visit (INDEPENDENT_AMBULATORY_CARE_PROVIDER_SITE_OTHER): Payer: Medicare Other | Admitting: Neurology

## 2013-08-15 ENCOUNTER — Encounter: Payer: Self-pay | Admitting: Neurology

## 2013-08-15 VITALS — BP 129/81 | HR 67 | Ht 67.0 in | Wt 280.0 lb

## 2013-08-15 DIAGNOSIS — G518 Other disorders of facial nerve: Secondary | ICD-10-CM | POA: Diagnosis not present

## 2013-08-15 DIAGNOSIS — G519 Disorder of facial nerve, unspecified: Secondary | ICD-10-CM

## 2013-08-15 MED ORDER — INCOBOTULINUMTOXINA 50 UNITS IM SOLR
50.0000 [IU] | Freq: Once | INTRAMUSCULAR | Status: AC
  Administered 2013-08-15: 50 [IU] via INTRAMUSCULAR

## 2013-08-15 NOTE — Progress Notes (Signed)
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   Since 2002, patient has had intermittent right eye and right facial twitching. This has gradually worsened over time. He was evaluated   in 2004 and diagnosed with hemifacial spasm. He had second opinion at [facility] with "extensive testing".  He tried and failed klonopin.  Ultimately he received botulinum toxin injection by Dr. Doy Mince in 2008, with excellent results. The benefit lasted for 4 months. Unfortunately his insurance expired and he was no longer able to receive this treatment.  Now patient is having persistent symptoms with significant impairment of his vision, ability to read, ability to use a computer, and ability to drive.  UPDATE Jan 28th 2015:  Last EMG guided injection was in Oct 2014, he did very well, received 20 units, no significant side effects, noticed mild recurrence of his right eye twitching over the past few days  Physical Exam  General: Patient is awake, alert and in no acute distress.  Well developed and groomed. Neck: Neck is supple. Cardiovascular: No carotid artery bruits.  Heart is regular rate and rhythm with no murmurs.  Neurologic Exam  Mental Status: Awake, alert Cranial Nerves: He has occaional right eye twitching, mild static right ptosis,  Motor: Normal bulk and tone.   Sensory: . Coordination: No ataxia  Gait and Station: Narrow based gait and steady Reflexes: Deep tendon reflexes in the upper and lower extremity are trace and symmetric.   Assessment and Plan: 73 year old left-handed male with right hemifacial spasm.  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  Total 50 units of Xeomin was was dissolved into 1 cc of NS (Lot No. [id number], Exp 03-3817).  used 32.5 units, discarded 17.5 units.  Right orbicularis oculi at   4 ,6, 7,8 ,9,  (2.5unit  each)= 12.5 Left orbicularis oculi at 2, 3 2.5 unitsx2=5 units  Right corrugate 5 Left corrugate 5 Procerus  5   Return to clinic in 3 months for repeat injections

## 2013-11-14 DIAGNOSIS — M1710 Unilateral primary osteoarthritis, unspecified knee: Secondary | ICD-10-CM | POA: Diagnosis not present

## 2013-11-15 ENCOUNTER — Ambulatory Visit (INDEPENDENT_AMBULATORY_CARE_PROVIDER_SITE_OTHER): Payer: Medicare Other | Admitting: Neurology

## 2013-11-15 ENCOUNTER — Encounter: Payer: Self-pay | Admitting: Neurology

## 2013-11-15 VITALS — BP 137/81 | HR 60 | Ht 67.0 in | Wt 275.0 lb

## 2013-11-15 DIAGNOSIS — G519 Disorder of facial nerve, unspecified: Secondary | ICD-10-CM

## 2013-11-15 DIAGNOSIS — G518 Other disorders of facial nerve: Secondary | ICD-10-CM

## 2013-11-16 DIAGNOSIS — G518 Other disorders of facial nerve: Secondary | ICD-10-CM | POA: Diagnosis not present

## 2013-11-16 MED ORDER — INCOBOTULINUMTOXINA 50 UNITS IM SOLR
50.0000 [IU] | Freq: Once | INTRAMUSCULAR | Status: AC
  Administered 2013-11-16: 50 [IU] via INTRAMUSCULAR

## 2013-11-16 NOTE — Progress Notes (Signed)
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   Since 2002, patient has had intermittent right eye and right facial twitching. This has gradually worsened over time. He was evaluated   in 2004 and diagnosed with hemifacial spasm. He had second opinion at [facility] with "extensive testing".  He tried and failed klonopin.  Ultimately he received botulinum toxin injection by Dr. Doy Mince in 2008, with excellent results. The benefit lasted for 4 months. Unfortunately his insurance expired and he was no longer able to receive this treatment.  Now patient is having persistent symptoms with significant impairment of his vision, ability to read, ability to use a computer, and ability to drive.  UPDATE April 30th 2015:  Last EMG guided injection was in Jan 2015, he did very well, received 20 units, no significant side effects, noticed mild recurrence of his right eye twitching over the past few days  Physical Exam  General: Patient is awake, alert and in no acute distress.  Well developed and groomed. Neck: Neck is supple. Cardiovascular: No carotid artery bruits.  Heart is regular rate and rhythm with no murmurs.  Neurologic Exam  Mental Status: Awake, alert Cranial Nerves: He has occaional right eye twitching, mild static right ptosis,  Motor: Normal bulk and tone.   Sensory: . Coordination: No ataxia  Gait and Station: Narrow based gait and steady Reflexes: Deep tendon reflexes in the upper and lower extremity are trace and symmetric.   Assessment and Plan: 73 year old left-handed male with right hemifacial spasm.  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  Total 50 units of Xeomin/1 cc of NS  .  used 32.5 units, discarded 17.5 units.  Left orbicularis oculi at  2, 3, 4 ,6, 8    (2.5unit each)= 12.5 Right orbicularis oculi at 2, 3 2.5  unitsx 2=5 units  Right corrugate 5 Left corrugate 5 Procerus  5   Return to clinic in 3 months for repeat injections

## 2013-11-19 DIAGNOSIS — J40 Bronchitis, not specified as acute or chronic: Secondary | ICD-10-CM | POA: Diagnosis not present

## 2013-11-19 DIAGNOSIS — R6889 Other general symptoms and signs: Secondary | ICD-10-CM | POA: Diagnosis not present

## 2014-01-10 DIAGNOSIS — I10 Essential (primary) hypertension: Secondary | ICD-10-CM | POA: Diagnosis not present

## 2014-01-10 DIAGNOSIS — N183 Chronic kidney disease, stage 3 unspecified: Secondary | ICD-10-CM | POA: Diagnosis not present

## 2014-01-10 DIAGNOSIS — E1129 Type 2 diabetes mellitus with other diabetic kidney complication: Secondary | ICD-10-CM | POA: Diagnosis not present

## 2014-01-10 DIAGNOSIS — Z6841 Body Mass Index (BMI) 40.0 and over, adult: Secondary | ICD-10-CM | POA: Diagnosis not present

## 2014-01-10 DIAGNOSIS — Z79899 Other long term (current) drug therapy: Secondary | ICD-10-CM | POA: Diagnosis not present

## 2014-01-10 DIAGNOSIS — E782 Mixed hyperlipidemia: Secondary | ICD-10-CM | POA: Diagnosis not present

## 2014-02-13 ENCOUNTER — Encounter (INDEPENDENT_AMBULATORY_CARE_PROVIDER_SITE_OTHER): Payer: Self-pay

## 2014-02-13 ENCOUNTER — Encounter: Payer: Self-pay | Admitting: Neurology

## 2014-02-13 ENCOUNTER — Ambulatory Visit (INDEPENDENT_AMBULATORY_CARE_PROVIDER_SITE_OTHER): Payer: Medicare Other | Admitting: Neurology

## 2014-02-13 DIAGNOSIS — G518 Other disorders of facial nerve: Secondary | ICD-10-CM | POA: Diagnosis not present

## 2014-02-13 DIAGNOSIS — H02409 Unspecified ptosis of unspecified eyelid: Secondary | ICD-10-CM

## 2014-02-13 MED ORDER — INCOBOTULINUMTOXINA 50 UNITS IM SOLR
50.0000 [IU] | Freq: Once | INTRAMUSCULAR | Status: AC
  Administered 2014-02-13: 50 [IU] via INTRAMUSCULAR

## 2014-02-13 NOTE — Progress Notes (Signed)
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   Since 2002, patient has had intermittent right eye and right facial twitching. This has gradually worsened over time. He was evaluated   in 2004 and diagnosed with hemifacial spasm. He had second opinion at [facility] with "extensive testing".  He tried and failed klonopin.  Ultimately he received botulinum toxin injection by Dr. Doy Mince in 2008, with excellent results. The benefit lasted for 4 months. Unfortunately his insurance expired and he was no longer able to receive this treatment.  Now patient is having persistent symptoms with significant impairment of his vision, ability to read, ability to use a computer, and ability to drive.  UPDATE July 29th 2015:  Last EMG guided injection was in April 2015, he did very well, received 20 units, no significant side effects   Physical Exam  General: Patient is awake, alert and in no acute distress.  Well developed and groomed. Neck: Neck is supple. Cardiovascular: No carotid artery bruits.  Heart is regular rate and rhythm with no murmurs.  Neurologic Exam  Mental Status: Awake, alert Cranial Nerves: He has occaional right eye twitching, mild static right ptosis,  Motor: Normal bulk and tone.   Sensory: . Coordination: No ataxia  Gait and Station: Narrow based gait and steady Reflexes: Deep tendon reflexes in the upper and lower extremity are trace and symmetric.   Assessment and Plan: 73 year old left-handed male with right hemifacial spasm.  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  Total 50 units of Xeomin/1 cc of NS  .  used 30 units, discarded 20 units.  Right orbicularis oculi at    4 ,6, 8, 9    (2.5unit each)= 10 Left orbicularis oculi at 2, 3,  2.5 unitsx 2=5 units  Right corrugate 5 Left corrugate 5 Left frontalis  2.5 Procerus  2.5   Return to clinic in 3 months for repeat injections

## 2014-02-27 DIAGNOSIS — M1710 Unilateral primary osteoarthritis, unspecified knee: Secondary | ICD-10-CM | POA: Diagnosis not present

## 2014-03-27 DIAGNOSIS — G479 Sleep disorder, unspecified: Secondary | ICD-10-CM | POA: Diagnosis not present

## 2014-03-27 DIAGNOSIS — E1129 Type 2 diabetes mellitus with other diabetic kidney complication: Secondary | ICD-10-CM | POA: Diagnosis not present

## 2014-03-27 DIAGNOSIS — E782 Mixed hyperlipidemia: Secondary | ICD-10-CM | POA: Diagnosis not present

## 2014-03-27 DIAGNOSIS — C649 Malignant neoplasm of unspecified kidney, except renal pelvis: Secondary | ICD-10-CM | POA: Diagnosis not present

## 2014-03-27 DIAGNOSIS — E039 Hypothyroidism, unspecified: Secondary | ICD-10-CM | POA: Diagnosis not present

## 2014-03-27 DIAGNOSIS — N183 Chronic kidney disease, stage 3 unspecified: Secondary | ICD-10-CM | POA: Diagnosis not present

## 2014-03-27 DIAGNOSIS — M109 Gout, unspecified: Secondary | ICD-10-CM | POA: Diagnosis not present

## 2014-03-27 DIAGNOSIS — I10 Essential (primary) hypertension: Secondary | ICD-10-CM | POA: Diagnosis not present

## 2014-05-15 ENCOUNTER — Ambulatory Visit: Payer: Medicare Other | Admitting: Neurology

## 2014-05-16 ENCOUNTER — Encounter: Payer: Self-pay | Admitting: Neurology

## 2014-05-17 DIAGNOSIS — M1711 Unilateral primary osteoarthritis, right knee: Secondary | ICD-10-CM | POA: Diagnosis not present

## 2014-06-05 ENCOUNTER — Ambulatory Visit (INDEPENDENT_AMBULATORY_CARE_PROVIDER_SITE_OTHER): Payer: Medicare Other | Admitting: Neurology

## 2014-06-05 ENCOUNTER — Encounter: Payer: Self-pay | Admitting: Neurology

## 2014-06-05 DIAGNOSIS — G513 Clonic hemifacial spasm: Secondary | ICD-10-CM

## 2014-06-05 DIAGNOSIS — G519 Disorder of facial nerve, unspecified: Secondary | ICD-10-CM

## 2014-06-05 MED ORDER — INCOBOTULINUMTOXINA 50 UNITS IM SOLR
50.0000 [IU] | Freq: Once | INTRAMUSCULAR | Status: AC
  Administered 2014-06-05: 50 [IU] via INTRAMUSCULAR

## 2014-06-05 NOTE — Progress Notes (Signed)
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   Since 2002, patient has had intermittent right eye and right facial twitching. This has gradually worsened over time. He was evaluated   in 2004 and diagnosed with hemifacial spasm. He had second opinion at [facility] with "extensive testing".  He tried and failed klonopin.  Ultimately he received botulinum toxin injection by Dr. Doy Mince in 2008, with excellent results. The benefit lasted for 4 months. Unfortunately his insurance expired and he was no longer able to receive this treatment.  Now patient is having persistent symptoms with significant impairment of his vision, ability to read, ability to use a computer, and ability to drive.  Update June 05 2014:  Last EMG guided film injection was in February 13 2014, he did very well, no significant side effect, recent few weeks, he noticed recurrent of right eye twitching  Physical Exam  General: Patient is awake, alert and in no acute distress.  Well developed and groomed. Neck: Neck is supple. Cardiovascular: No carotid artery bruits.  Heart is regular rate and rhythm with no murmurs.  Neurologic Exam  Mental Status: Awake, alert Cranial Nerves: He has occaional right eye twitching, mild static right ptosis,  Motor: Normal bulk and tone.   Sensory: . Coordination: No ataxia  Gait and Station: Narrow based gait and steady Reflexes: Deep tendon reflexes in the upper and lower extremity are trace and symmetric.   Assessment and Plan: 73 year old left-handed male with right hemifacial spasm.  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  Total 50 units of Xeomin/1 cc of NS    Right orbicularis oculi at    4 ,6, 8, 9,  10  (2.5unit each)= 12.5 Left orbicularis oculi at 2, 3,  2.5 unitsx 2=5 units  Right corrugate 5 Left corrugate  5 Procerus 5  Right frontalis 5x2=10 Left frontalis  7.5    Return to clinic in 3 months for repeat injections

## 2014-07-26 DIAGNOSIS — E782 Mixed hyperlipidemia: Secondary | ICD-10-CM | POA: Diagnosis not present

## 2014-07-26 DIAGNOSIS — Z23 Encounter for immunization: Secondary | ICD-10-CM | POA: Diagnosis not present

## 2014-07-26 DIAGNOSIS — M109 Gout, unspecified: Secondary | ICD-10-CM | POA: Diagnosis not present

## 2014-07-26 DIAGNOSIS — G479 Sleep disorder, unspecified: Secondary | ICD-10-CM | POA: Diagnosis not present

## 2014-07-26 DIAGNOSIS — M170 Bilateral primary osteoarthritis of knee: Secondary | ICD-10-CM | POA: Diagnosis not present

## 2014-07-26 DIAGNOSIS — E039 Hypothyroidism, unspecified: Secondary | ICD-10-CM | POA: Diagnosis not present

## 2014-07-26 DIAGNOSIS — Z79899 Other long term (current) drug therapy: Secondary | ICD-10-CM | POA: Diagnosis not present

## 2014-07-26 DIAGNOSIS — Z0000 Encounter for general adult medical examination without abnormal findings: Secondary | ICD-10-CM | POA: Diagnosis not present

## 2014-07-26 DIAGNOSIS — N183 Chronic kidney disease, stage 3 (moderate): Secondary | ICD-10-CM | POA: Diagnosis not present

## 2014-07-26 DIAGNOSIS — H5213 Myopia, bilateral: Secondary | ICD-10-CM | POA: Diagnosis not present

## 2014-07-26 DIAGNOSIS — E1122 Type 2 diabetes mellitus with diabetic chronic kidney disease: Secondary | ICD-10-CM | POA: Diagnosis not present

## 2014-07-26 DIAGNOSIS — C641 Malignant neoplasm of right kidney, except renal pelvis: Secondary | ICD-10-CM | POA: Diagnosis not present

## 2014-07-26 DIAGNOSIS — I10 Essential (primary) hypertension: Secondary | ICD-10-CM | POA: Diagnosis not present

## 2014-07-26 DIAGNOSIS — H02401 Unspecified ptosis of right eyelid: Secondary | ICD-10-CM | POA: Diagnosis not present

## 2014-07-31 ENCOUNTER — Other Ambulatory Visit (HOSPITAL_COMMUNITY): Payer: Self-pay | Admitting: Urology

## 2014-07-31 ENCOUNTER — Ambulatory Visit (HOSPITAL_COMMUNITY)
Admission: RE | Admit: 2014-07-31 | Discharge: 2014-07-31 | Disposition: A | Discharge status: Home / Self Care | Payer: Medicare Other | Source: Ambulatory Visit | Attending: Urology | Admitting: Urology

## 2014-07-31 DIAGNOSIS — C649 Malignant neoplasm of unspecified kidney, except renal pelvis: Secondary | ICD-10-CM

## 2014-07-31 DIAGNOSIS — J984 Other disorders of lung: Secondary | ICD-10-CM | POA: Diagnosis not present

## 2014-07-31 DIAGNOSIS — J9809 Other diseases of bronchus, not elsewhere classified: Secondary | ICD-10-CM | POA: Diagnosis not present

## 2014-08-06 DIAGNOSIS — Z905 Acquired absence of kidney: Secondary | ICD-10-CM | POA: Diagnosis not present

## 2014-08-06 DIAGNOSIS — C649 Malignant neoplasm of unspecified kidney, except renal pelvis: Secondary | ICD-10-CM | POA: Diagnosis not present

## 2014-08-06 DIAGNOSIS — N281 Cyst of kidney, acquired: Secondary | ICD-10-CM | POA: Diagnosis not present

## 2014-09-03 ENCOUNTER — Encounter: Payer: Self-pay | Admitting: *Deleted

## 2014-09-04 ENCOUNTER — Ambulatory Visit (INDEPENDENT_AMBULATORY_CARE_PROVIDER_SITE_OTHER): Payer: Medicare Other | Admitting: Neurology

## 2014-09-04 ENCOUNTER — Encounter: Payer: Self-pay | Admitting: Neurology

## 2014-09-04 VITALS — BP 108/70 | HR 77 | Ht 67.0 in | Wt 277.0 lb

## 2014-09-04 DIAGNOSIS — G5139 Clonic hemifacial spasm, unspecified: Secondary | ICD-10-CM | POA: Insufficient documentation

## 2014-09-04 DIAGNOSIS — G513 Clonic hemifacial spasm: Secondary | ICD-10-CM | POA: Diagnosis not present

## 2014-09-04 DIAGNOSIS — M1711 Unilateral primary osteoarthritis, right knee: Secondary | ICD-10-CM | POA: Diagnosis not present

## 2014-09-04 MED ORDER — INCOBOTULINUMTOXINA 50 UNITS IM SOLR
50.0000 [IU] | Freq: Once | INTRAMUSCULAR | Status: AC
  Administered 2014-09-04: 50 [IU] via INTRAMUSCULAR

## 2014-09-04 NOTE — Progress Notes (Signed)
HPI: 74 year old left-handed male with history of hypertension, hypercholesterolemia, coronary artery disease, here for EMG guided BOTOX injection for right hemifacial spasm.   Since 2002, patient has had intermittent right eye and right facial twitching. This has gradually worsened over time. He was evaluated   in 2004 and diagnosed with hemifacial spasm. He had second opinion at [facility] with "extensive testing".  He tried and failed klonopin.  Ultimately he received botulinum toxin injection by Dr. Doy Mince in 2008, with excellent results. The benefit lasted for 4 months. Unfortunately his insurance expired and he was no longer able to receive this treatment.  Now patient is having persistent symptoms with significant impairment of his vision, ability to read, ability to use a computer, and ability to drive.  Update Feb 17th 2016:  Last EMG guided film injection was in Nov 2015, he did very well, no significant side effect, recent few weeks, he noticed recurrent of right eye and right cheek muscle twitching  Physical Exam  General: Patient is awake, alert and in no acute distress.  Well developed and groomed. Neck: Neck is supple. Cardiovascular: No carotid artery bruits.  Heart is regular rate and rhythm with no murmurs.  Neurologic Exam  Mental Status: Awake, alert Cranial Nerves: He has occaional right eye twitching, mild static right ptosis,  Motor: Normal bulk and tone.   Sensory: . Coordination: No ataxia  Gait and Station: Narrow based gait and steady Reflexes: Deep tendon reflexes in the upper and lower extremity are trace and symmetric.   Assessment and Plan: 74 year old left-handed male with right hemifacial spasm.  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  Total 50 units of Xeomin/1 cc of NS, used 37.5 units, discard 12. 5 units   Right orbicularis oculi at    4 ,6, 8, 9  (2.5unit each)= 10 units Left orbicularis oculi at 2, 3,  2.5 unitsx  2=5 units  Right corrugate 2.5 Left corrugate 2.5 Procerus 5  Right nasalis 5 units Right Zygomatic major 2.5 units  Right Platysmus 2.5 units x 2=5 units    Return to clinic in 3 months for repeat injections   [name], M.D. Ph.D.  [facility] [place], [place] [address] Phone: [phone] Fax:      [phone]

## 2014-10-15 ENCOUNTER — Encounter: Payer: Self-pay | Admitting: Neurology

## 2014-11-12 ENCOUNTER — Telehealth: Payer: Self-pay | Admitting: Neurology

## 2014-11-12 DIAGNOSIS — G5139 Clonic hemifacial spasm, unspecified: Secondary | ICD-10-CM

## 2014-11-12 MED ORDER — INCOBOTULINUMTOXINA 50 UNITS IM SOLR: INTRAMUSCULAR | Status: DC

## 2014-11-12 NOTE — Telephone Encounter (Signed)
I have send Xeomin 50 units to [facility]

## 2014-11-12 NOTE — Telephone Encounter (Signed)
-----   Message from Danford Bad sent at 11/12/2014  8:53 AM EDT ----- Please send an RX for Xeomin to this patient to [facility] on [address]. Let me know when it is done.

## 2014-12-03 ENCOUNTER — Ambulatory Visit: Payer: Medicare Other | Admitting: Neurology

## 2014-12-03 ENCOUNTER — Encounter: Payer: Self-pay | Admitting: *Deleted

## 2014-12-03 DIAGNOSIS — G5139 Clonic hemifacial spasm, unspecified: Secondary | ICD-10-CM

## 2014-12-04 ENCOUNTER — Ambulatory Visit: Payer: Medicare Other | Admitting: Neurology

## 2014-12-04 DIAGNOSIS — M1711 Unilateral primary osteoarthritis, right knee: Secondary | ICD-10-CM | POA: Diagnosis not present

## 2014-12-09 ENCOUNTER — Encounter: Payer: Self-pay | Admitting: Neurology

## 2014-12-09 ENCOUNTER — Ambulatory Visit (INDEPENDENT_AMBULATORY_CARE_PROVIDER_SITE_OTHER): Payer: Medicare Other | Admitting: Neurology

## 2014-12-09 VITALS — BP 117/77 | HR 76 | Ht 67.0 in | Wt 277.0 lb

## 2014-12-09 DIAGNOSIS — G513 Clonic hemifacial spasm: Secondary | ICD-10-CM | POA: Diagnosis not present

## 2014-12-09 DIAGNOSIS — G5139 Clonic hemifacial spasm, unspecified: Secondary | ICD-10-CM

## 2014-12-09 NOTE — Progress Notes (Signed)
HPI: 74 year old left-handed male with history of hypertension, hypercholesterolemia, coronary artery disease, here for EMG guided BOTOX injection for right hemifacial spasm.   Since 2002, patient has had intermittent right eye and right facial twitching. This has gradually worsened over time. He was evaluated   in 2004 and diagnosed with hemifacial spasm. He had second opinion at [facility] with "extensive testing".  He tried and failed klonopin.  Ultimately he received botulinum toxin injection by Dr. Doy Mince in 2008, with excellent results. The benefit lasted for 4 months. Unfortunately his insurance expired and he was no longer able to receive this treatment.  Now patient is having persistent symptoms with significant impairment of his vision, ability to read, ability to use a computer, and ability to drive.  UPDATE Dec 09 2014: Last EMG guided injection was in February 2016, he did very well, only mild right lower eyelid twitching occasionally  Physical Exam   Cranial Nerves: He has occaional right lower eyelid twitching, mild static right ptosis,    Assessment and Plan: 74 year old left-handed male with right hemifacial spasm.  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  Total 50 units of Xeomin/1 cc of NS, used 32.5  units, discard 17. 5 units   Right orbicularis oculi at    4 ,6, 8, 9   (2.5unit each)= 10 units Left orbicularis oculi at 2, 3, 6  2.5 unitsx 2=7.5 units  Right corrugate 5 Left corrugate 5 Procerus 5   Return to clinic in 3 months for repeat injections   Thomas Michael, M.D. Ph.D.  [facility] [place], [place] [address] Phone: [phone] Fax:      [phone]

## 2014-12-09 NOTE — Progress Notes (Signed)
**  Xeomin, Lot [id number], Exp 12/2016**mck,rn

## 2014-12-25 ENCOUNTER — Telehealth: Payer: Self-pay

## 2014-12-25 NOTE — Telephone Encounter (Signed)
Called patient and left him a message for Xeomin apt. With Dr.Yan he needs to come after August 23 rd.

## 2015-01-02 DIAGNOSIS — E1122 Type 2 diabetes mellitus with diabetic chronic kidney disease: Secondary | ICD-10-CM | POA: Diagnosis not present

## 2015-01-02 DIAGNOSIS — N183 Chronic kidney disease, stage 3 (moderate): Secondary | ICD-10-CM | POA: Diagnosis not present

## 2015-01-02 DIAGNOSIS — J069 Acute upper respiratory infection, unspecified: Secondary | ICD-10-CM | POA: Diagnosis not present

## 2015-01-02 DIAGNOSIS — E782 Mixed hyperlipidemia: Secondary | ICD-10-CM | POA: Diagnosis not present

## 2015-01-02 DIAGNOSIS — I10 Essential (primary) hypertension: Secondary | ICD-10-CM | POA: Diagnosis not present

## 2015-01-02 DIAGNOSIS — E781 Pure hyperglyceridemia: Secondary | ICD-10-CM | POA: Diagnosis not present

## 2015-02-07 ENCOUNTER — Ambulatory Visit (HOSPITAL_COMMUNITY)
Admission: RE | Admit: 2015-02-07 | Discharge: 2015-02-07 | Disposition: A | Discharge status: Home / Self Care | Payer: Medicare Other | Source: Ambulatory Visit | Attending: Urology | Admitting: Urology

## 2015-02-07 ENCOUNTER — Other Ambulatory Visit (HOSPITAL_COMMUNITY): Payer: Self-pay | Admitting: Urology

## 2015-02-07 DIAGNOSIS — J984 Other disorders of lung: Secondary | ICD-10-CM | POA: Diagnosis not present

## 2015-02-07 DIAGNOSIS — Z85528 Personal history of other malignant neoplasm of kidney: Secondary | ICD-10-CM | POA: Insufficient documentation

## 2015-02-07 DIAGNOSIS — C649 Malignant neoplasm of unspecified kidney, except renal pelvis: Secondary | ICD-10-CM

## 2015-02-07 DIAGNOSIS — Z87891 Personal history of nicotine dependence: Secondary | ICD-10-CM | POA: Diagnosis not present

## 2015-02-20 ENCOUNTER — Telehealth: Payer: Self-pay | Admitting: Neurology

## 2015-02-20 NOTE — Telephone Encounter (Signed)
Pt called to schedule Botox . Please call and advise [phone]

## 2015-02-24 NOTE — Telephone Encounter (Signed)
Returned patient's call. No answer. Left message.  Ok to schedule Xeomin Injection appt after 03/11/15 with Dr. Krista Blue Thanks

## 2015-02-24 NOTE — Telephone Encounter (Signed)
Patient calling again to schedule Botox.  Please call him at [phone]

## 2015-02-25 NOTE — Telephone Encounter (Signed)
Patient called returning Cassandra's call. He states he is on the phone also all day. Please call and if don't get him try back in 10 min.

## 2015-02-27 NOTE — Telephone Encounter (Signed)
Xeomin Injection appt was scheduled.

## 2015-03-20 NOTE — Telephone Encounter (Signed)
This encounter was created in error - please disregard.

## 2015-03-31 ENCOUNTER — Ambulatory Visit (INDEPENDENT_AMBULATORY_CARE_PROVIDER_SITE_OTHER): Payer: Medicare Other | Admitting: Neurology

## 2015-03-31 ENCOUNTER — Encounter: Payer: Self-pay | Admitting: Neurology

## 2015-03-31 DIAGNOSIS — G5139 Clonic hemifacial spasm, unspecified: Secondary | ICD-10-CM

## 2015-03-31 DIAGNOSIS — G513 Clonic hemifacial spasm: Secondary | ICD-10-CM

## 2015-03-31 NOTE — Progress Notes (Signed)
**  Xeomin 50units, Lot [id number], Exp 12/2016, [place] 0141-0301-31, Specialty Pharmacy**mck,rn

## 2015-03-31 NOTE — Progress Notes (Signed)
HPI: 74 year old left-handed male with history of hypertension, hypercholesterolemia, coronary artery disease, here for EMG guided BOTOX injection for right hemifacial spasm.   Since 2002, patient has had intermittent right eye and right facial twitching. This has gradually worsened over time. He was evaluated   in 2004 and diagnosed with hemifacial spasm. He had second opinion at [facility] with "extensive testing".  He tried and failed klonopin.  Ultimately he received botulinum toxin injection by Dr. Doy Mince in 2008, with excellent results. The benefit lasted for 4 months. Unfortunately his insurance expired and he was no longer able to receive this treatment.  Now patient is having persistent symptoms with significant impairment of his vision, ability to read, ability to use a computer, and ability to drive.  UPDATE Dec 09 2014: Last EMG guided injection was in February 2016, he did very well, only mild right lower eyelid twitching occasionally  UPDATE Mar 31 2015: Previous EMG guided xeomin injection in May 2016 works very well for him, benefit last almost 4 month, only recently noticed mild recurrent right eye twitching, no significant side effect noticed  Physical Exam   Cranial Nerves: He has occaional right lower eyelid twitching, mild static right ptosis,    Assessment and Plan: 74 year old left-handed male with right hemifacial spasm.  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  Total 50 units of Xeomin/1 cc of NS, used 32.5  units, discard 17. 5 units   Right orbicularis oculi at  2, 4 ,6, 8, 9   (2.5unit each)= 12.5 units Left orbicularis oculi at 2, 3,  2.5 unitsx 2=5 units  Right corrugate 5 Left corrugate 5 Procerus 5   Return to clinic in 3 months for repeat injections   [name], M.D. Ph.D.  [facility] [place], [place] [address] Phone: [phone] Fax:      [phone]

## 2015-04-16 DIAGNOSIS — M1711 Unilateral primary osteoarthritis, right knee: Secondary | ICD-10-CM | POA: Diagnosis not present

## 2015-06-18 DIAGNOSIS — R2689 Other abnormalities of gait and mobility: Secondary | ICD-10-CM | POA: Diagnosis not present

## 2015-06-18 DIAGNOSIS — M25562 Pain in left knee: Secondary | ICD-10-CM | POA: Diagnosis not present

## 2015-06-18 DIAGNOSIS — R262 Difficulty in walking, not elsewhere classified: Secondary | ICD-10-CM | POA: Diagnosis not present

## 2015-06-18 DIAGNOSIS — M25561 Pain in right knee: Secondary | ICD-10-CM | POA: Diagnosis not present

## 2015-06-18 DIAGNOSIS — M170 Bilateral primary osteoarthritis of knee: Secondary | ICD-10-CM | POA: Diagnosis not present

## 2015-06-23 DIAGNOSIS — M25562 Pain in left knee: Secondary | ICD-10-CM | POA: Diagnosis not present

## 2015-06-23 DIAGNOSIS — M1712 Unilateral primary osteoarthritis, left knee: Secondary | ICD-10-CM | POA: Diagnosis not present

## 2015-06-24 DIAGNOSIS — M25561 Pain in right knee: Secondary | ICD-10-CM | POA: Diagnosis not present

## 2015-06-24 DIAGNOSIS — M1711 Unilateral primary osteoarthritis, right knee: Secondary | ICD-10-CM | POA: Diagnosis not present

## 2015-06-30 DIAGNOSIS — M1712 Unilateral primary osteoarthritis, left knee: Secondary | ICD-10-CM | POA: Diagnosis not present

## 2015-06-30 DIAGNOSIS — M25562 Pain in left knee: Secondary | ICD-10-CM | POA: Diagnosis not present

## 2015-07-01 DIAGNOSIS — M25561 Pain in right knee: Secondary | ICD-10-CM | POA: Diagnosis not present

## 2015-07-01 DIAGNOSIS — M1711 Unilateral primary osteoarthritis, right knee: Secondary | ICD-10-CM | POA: Diagnosis not present

## 2015-07-07 DIAGNOSIS — M25562 Pain in left knee: Secondary | ICD-10-CM | POA: Diagnosis not present

## 2015-07-07 DIAGNOSIS — M1712 Unilateral primary osteoarthritis, left knee: Secondary | ICD-10-CM | POA: Diagnosis not present

## 2015-07-08 DIAGNOSIS — M25561 Pain in right knee: Secondary | ICD-10-CM | POA: Diagnosis not present

## 2015-07-08 DIAGNOSIS — M1711 Unilateral primary osteoarthritis, right knee: Secondary | ICD-10-CM | POA: Diagnosis not present

## 2015-07-14 DIAGNOSIS — M1712 Unilateral primary osteoarthritis, left knee: Secondary | ICD-10-CM | POA: Diagnosis not present

## 2015-07-14 DIAGNOSIS — M25562 Pain in left knee: Secondary | ICD-10-CM | POA: Diagnosis not present

## 2015-07-15 DIAGNOSIS — M1711 Unilateral primary osteoarthritis, right knee: Secondary | ICD-10-CM | POA: Diagnosis not present

## 2015-07-15 DIAGNOSIS — M25561 Pain in right knee: Secondary | ICD-10-CM | POA: Diagnosis not present

## 2015-08-19 DIAGNOSIS — H02401 Unspecified ptosis of right eyelid: Secondary | ICD-10-CM | POA: Diagnosis not present

## 2015-08-19 DIAGNOSIS — H5213 Myopia, bilateral: Secondary | ICD-10-CM | POA: Diagnosis not present

## 2015-09-09 ENCOUNTER — Telehealth: Payer: Self-pay | Admitting: Neurology

## 2015-09-15 ENCOUNTER — Telehealth: Payer: Self-pay | Admitting: Neurology

## 2015-09-15 NOTE — Telephone Encounter (Signed)
Spoke with the patient. He informed me that he would call for apt once his medication was ready for pick up at [facility].

## 2015-09-15 NOTE — Telephone Encounter (Signed)
Pt called and has received his botox. He would like a call back to set up an appt. Please call [phone]. Thank you

## 2015-09-17 ENCOUNTER — Ambulatory Visit (INDEPENDENT_AMBULATORY_CARE_PROVIDER_SITE_OTHER): Payer: Medicare Other | Admitting: Neurology

## 2015-09-17 ENCOUNTER — Encounter: Payer: Self-pay | Admitting: Neurology

## 2015-09-17 VITALS — BP 111/69 | HR 67 | Ht 67.0 in | Wt 279.0 lb

## 2015-09-17 DIAGNOSIS — G513 Clonic hemifacial spasm: Secondary | ICD-10-CM

## 2015-09-17 DIAGNOSIS — G5139 Clonic hemifacial spasm, unspecified: Secondary | ICD-10-CM

## 2015-09-17 NOTE — Telephone Encounter (Signed)
Patient has been scheduled

## 2015-09-17 NOTE — Progress Notes (Signed)
**  Xeomin 50 units x 1 vial, Lot [id number], Exp 01/2017, [place] [id number], specialty pharmacy**mck,rn.

## 2015-09-17 NOTE — Progress Notes (Signed)
HPI: 75 year old left-handed male with history of hypertension, hypercholesterolemia, coronary artery disease, here for EMG guided BOTOX injection for right hemifacial spasm.   Since 2002, patient has had intermittent right eye and right facial twitching. This has gradually worsened over time. He was evaluated   in 2004 and diagnosed with hemifacial spasm. He had second opinion at [facility] with "extensive testing".  He tried and failed klonopin.  Ultimately he received botulinum toxin injection by Dr. Doy Mince in 2008, with excellent results. The benefit lasted for 4 months. Unfortunately his insurance expired and he was no longer able to receive this treatment.  Now patient is having persistent symptoms with significant impairment of his vision, ability to read, ability to use a computer, and ability to drive.  UPDATE Dec 09 2014: Last EMG guided injection was in February 2016, he did very well, only mild right lower eyelid twitching occasionally  UPDATE Mar 31 2015: Previous EMG guided xeomin injection in May 2016 works very well for him, benefit last almost 4 month, only recently noticed mild recurrent right eye twitching, no significant side effect noticed  Update September 17 2015: Last EMG guided injection for his right hemifacial spasm was in September 2016, he tolerated the injection well, no significant side effect, the benefit last 4 months or longer, only recent few weeks, he noticed occasionally right facial twitching.  Physical Exam   Cranial Nerves: He has occaional right lower eyelid twitching, mild static right ptosis,    Assessment and Plan: 75 year old left-handed male with right hemifacial spasm.  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  Total 50 units of Xeomin/1 cc of NS, used 32.5  units, discard 17. 5 units   Right orbicularis oculi at  2, 4 ,6, 8, 9   (2.5unit each)= 12.5 units Left orbicularis oculi at 2, 3,  2.5 unitsx 2=5 units  Right  corrugate 5 Left corrugate 5 Procerus 5   Return to clinic in 3 months for repeat injections   [name], M.D. Ph.D.  [facility] [place], [place] [address] Phone: [phone] Fax:      [phone]

## 2015-09-24 ENCOUNTER — Telehealth: Payer: Self-pay | Admitting: Neurology

## 2015-09-24 NOTE — Telephone Encounter (Signed)
Called patient to schedule injection, he did not answer so I left a VM asking him to return my call.

## 2015-10-14 DIAGNOSIS — M170 Bilateral primary osteoarthritis of knee: Secondary | ICD-10-CM | POA: Diagnosis not present

## 2015-10-14 DIAGNOSIS — M25562 Pain in left knee: Secondary | ICD-10-CM | POA: Diagnosis not present

## 2015-10-14 DIAGNOSIS — R262 Difficulty in walking, not elsewhere classified: Secondary | ICD-10-CM | POA: Diagnosis not present

## 2015-10-14 DIAGNOSIS — M25561 Pain in right knee: Secondary | ICD-10-CM | POA: Diagnosis not present

## 2015-12-23 ENCOUNTER — Other Ambulatory Visit: Payer: Self-pay | Admitting: Neurology

## 2015-12-26 DIAGNOSIS — Z0000 Encounter for general adult medical examination without abnormal findings: Secondary | ICD-10-CM | POA: Diagnosis not present

## 2015-12-26 DIAGNOSIS — C641 Malignant neoplasm of right kidney, except renal pelvis: Secondary | ICD-10-CM | POA: Diagnosis not present

## 2015-12-26 DIAGNOSIS — G4733 Obstructive sleep apnea (adult) (pediatric): Secondary | ICD-10-CM | POA: Diagnosis not present

## 2015-12-26 DIAGNOSIS — E039 Hypothyroidism, unspecified: Secondary | ICD-10-CM | POA: Diagnosis not present

## 2015-12-26 DIAGNOSIS — Z9861 Coronary angioplasty status: Secondary | ICD-10-CM | POA: Diagnosis not present

## 2015-12-26 DIAGNOSIS — E782 Mixed hyperlipidemia: Secondary | ICD-10-CM | POA: Diagnosis not present

## 2015-12-26 DIAGNOSIS — M109 Gout, unspecified: Secondary | ICD-10-CM | POA: Diagnosis not present

## 2015-12-26 DIAGNOSIS — Z905 Acquired absence of kidney: Secondary | ICD-10-CM | POA: Diagnosis not present

## 2015-12-26 DIAGNOSIS — Z79899 Other long term (current) drug therapy: Secondary | ICD-10-CM | POA: Diagnosis not present

## 2015-12-26 DIAGNOSIS — E1122 Type 2 diabetes mellitus with diabetic chronic kidney disease: Secondary | ICD-10-CM | POA: Diagnosis not present

## 2015-12-26 DIAGNOSIS — I2510 Atherosclerotic heart disease of native coronary artery without angina pectoris: Secondary | ICD-10-CM | POA: Diagnosis not present

## 2015-12-26 DIAGNOSIS — M170 Bilateral primary osteoarthritis of knee: Secondary | ICD-10-CM | POA: Diagnosis not present

## 2016-01-01 ENCOUNTER — Ambulatory Visit: Payer: Medicare Other | Admitting: Neurology

## 2016-01-07 ENCOUNTER — Telehealth: Payer: Self-pay | Admitting: *Deleted

## 2016-01-07 NOTE — Telephone Encounter (Signed)
Lab results received from PCP (collected on 12/26/15):  Hgb A1C: 7.5  CBC w/ Diff: HGB: 14.3  CMP: Glucose: 109 Creatinine: 1.55 EGFR: 44  LIPID PANEL: Cholesterol: 125 Triglyceride: 333 LDLc: 22

## 2016-01-08 ENCOUNTER — Encounter: Payer: Self-pay | Admitting: Neurology

## 2016-01-08 ENCOUNTER — Ambulatory Visit (INDEPENDENT_AMBULATORY_CARE_PROVIDER_SITE_OTHER): Payer: Medicare Other | Admitting: Neurology

## 2016-01-08 VITALS — BP 106/70 | HR 63 | Ht 67.0 in | Wt 274.0 lb

## 2016-01-08 DIAGNOSIS — G513 Clonic hemifacial spasm: Secondary | ICD-10-CM

## 2016-01-08 DIAGNOSIS — G5139 Clonic hemifacial spasm, unspecified: Secondary | ICD-10-CM

## 2016-01-08 NOTE — Progress Notes (Signed)
**  Xeomin 50 units x 1 vial, Lot [id number], Exp 03/2017, [place] [id number], specialty pharmacy.**mck,rn.

## 2016-01-08 NOTE — Progress Notes (Signed)
HPI: 75 year old left-handed male with history of hypertension, hypercholesterolemia, coronary artery disease, here for EMG guided BOTOX injection for right hemifacial spasm.   Since 2002, patient has had intermittent right eye and right facial twitching. This has gradually worsened over time. He was evaluated   in 2004 and diagnosed with hemifacial spasm. He had second opinion at [facility] with "extensive testing".  He tried and failed klonopin.  Ultimately he received botulinum toxin injection by Dr. Doy Mince in 2008, with excellent results. The benefit lasted for 4 months. Unfortunately his insurance expired and he was no longer able to receive this treatment.  Now patient is having persistent symptoms with significant impairment of his vision, ability to read, ability to use a computer, and ability to drive.  UPDATE Dec 09 2014: Last EMG guided injection was in February 2016, he did very well, only mild right lower eyelid twitching occasionally  UPDATE Mar 31 2015: Previous EMG guided xeomin injection in May 2016 works very well for him, benefit last almost 4 month, only recently noticed mild recurrent right eye twitching, no significant side effect noticed  Update September 17 2015: Last EMG guided injection for his right hemifacial spasm was in September 2016, he tolerated the injection well, no significant side effect, the benefit last 4 months or longer, only recent few weeks, he noticed occasionally right facial twitching.  UPDATE January 08 2016:  He tolerated previous injection in March 2017, on in recent few weeks he noticed intermittent right eye twitching, occasionally right facial muscle twitching  Physical Exam   Cranial Nerves: He has occaional right lower eyelid twitching, mild static right ptosis,    Assessment and Plan: 75 year old left-handed male with right hemifacial spasm.  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  Total 50 units of Xeomin/1 cc  of NS, used 35  units, discard 15 units   Right orbicularis oculi at  2, 4 ,6, 8, 9   (2.5unit each)= 12.5 units Left orbicularis oculi at 2, 3,  2.5 unitsx 2=5 units  Right corrugate 5 Left corrugate 5 Procerus 5  Right levator labial superioris 2.5 units   Return to clinic in 3 months for repeat injections   [name], M.D. Ph.D.  [facility] [place], [place] [address] Phone: [phone] Fax:      [phone]

## 2016-01-27 DIAGNOSIS — R262 Difficulty in walking, not elsewhere classified: Secondary | ICD-10-CM | POA: Diagnosis not present

## 2016-01-27 DIAGNOSIS — M170 Bilateral primary osteoarthritis of knee: Secondary | ICD-10-CM | POA: Diagnosis not present

## 2016-01-27 DIAGNOSIS — M25562 Pain in left knee: Secondary | ICD-10-CM | POA: Diagnosis not present

## 2016-01-27 DIAGNOSIS — M25561 Pain in right knee: Secondary | ICD-10-CM | POA: Diagnosis not present

## 2016-02-24 DIAGNOSIS — M25562 Pain in left knee: Secondary | ICD-10-CM | POA: Diagnosis not present

## 2016-02-24 DIAGNOSIS — M25561 Pain in right knee: Secondary | ICD-10-CM | POA: Diagnosis not present

## 2016-02-24 DIAGNOSIS — M170 Bilateral primary osteoarthritis of knee: Secondary | ICD-10-CM | POA: Diagnosis not present

## 2016-03-02 DIAGNOSIS — M25561 Pain in right knee: Secondary | ICD-10-CM | POA: Diagnosis not present

## 2016-03-02 DIAGNOSIS — M1711 Unilateral primary osteoarthritis, right knee: Secondary | ICD-10-CM | POA: Diagnosis not present

## 2016-03-09 DIAGNOSIS — M1712 Unilateral primary osteoarthritis, left knee: Secondary | ICD-10-CM | POA: Diagnosis not present

## 2016-03-09 DIAGNOSIS — M25562 Pain in left knee: Secondary | ICD-10-CM | POA: Diagnosis not present

## 2016-03-16 DIAGNOSIS — M25561 Pain in right knee: Secondary | ICD-10-CM | POA: Diagnosis not present

## 2016-03-16 DIAGNOSIS — M1711 Unilateral primary osteoarthritis, right knee: Secondary | ICD-10-CM | POA: Diagnosis not present

## 2016-03-23 DIAGNOSIS — M25562 Pain in left knee: Secondary | ICD-10-CM | POA: Diagnosis not present

## 2016-03-23 DIAGNOSIS — M1712 Unilateral primary osteoarthritis, left knee: Secondary | ICD-10-CM | POA: Diagnosis not present

## 2016-03-25 DIAGNOSIS — L0232 Furuncle of buttock: Secondary | ICD-10-CM | POA: Diagnosis not present

## 2016-03-25 DIAGNOSIS — J329 Chronic sinusitis, unspecified: Secondary | ICD-10-CM | POA: Diagnosis not present

## 2016-03-30 DIAGNOSIS — M1711 Unilateral primary osteoarthritis, right knee: Secondary | ICD-10-CM | POA: Diagnosis not present

## 2016-03-30 DIAGNOSIS — M25561 Pain in right knee: Secondary | ICD-10-CM | POA: Diagnosis not present

## 2016-04-06 DIAGNOSIS — M25562 Pain in left knee: Secondary | ICD-10-CM | POA: Diagnosis not present

## 2016-04-06 DIAGNOSIS — M1712 Unilateral primary osteoarthritis, left knee: Secondary | ICD-10-CM | POA: Diagnosis not present

## 2016-04-13 DIAGNOSIS — M170 Bilateral primary osteoarthritis of knee: Secondary | ICD-10-CM | POA: Diagnosis not present

## 2016-04-13 DIAGNOSIS — M25561 Pain in right knee: Secondary | ICD-10-CM | POA: Diagnosis not present

## 2016-04-13 DIAGNOSIS — M25562 Pain in left knee: Secondary | ICD-10-CM | POA: Diagnosis not present

## 2016-06-17 ENCOUNTER — Telehealth: Payer: Self-pay | Admitting: Neurology

## 2016-06-17 NOTE — Telephone Encounter (Signed)
appt scheduled for botox on 12/27. Pt said [facility] will need PA to ship

## 2016-07-14 ENCOUNTER — Encounter: Payer: Self-pay | Admitting: Neurology

## 2016-07-14 ENCOUNTER — Ambulatory Visit (INDEPENDENT_AMBULATORY_CARE_PROVIDER_SITE_OTHER): Payer: Medicare Other | Admitting: Neurology

## 2016-07-14 VITALS — BP 119/69 | HR 57 | Ht 67.0 in | Wt 276.0 lb

## 2016-07-14 DIAGNOSIS — G513 Clonic hemifacial spasm: Secondary | ICD-10-CM

## 2016-07-14 DIAGNOSIS — G5139 Clonic hemifacial spasm, unspecified: Secondary | ICD-10-CM

## 2016-07-14 NOTE — Progress Notes (Signed)
**  Xeomin 50 units x 1 vial, Lot [id number], Exp 09/2017, [place] [id number], specialty pharmacy.//mck,rn**

## 2016-07-14 NOTE — Progress Notes (Signed)
HPI: 75 year old left-handed male with history of hypertension, hypercholesterolemia, coronary artery disease, here for EMG guided BOTOX injection for right hemifacial spasm.   Since 2002, patient has had intermittent right eye and right facial twitching. This has gradually worsened over time. He was evaluated   in 2004 and diagnosed with hemifacial spasm. He had second opinion at [facility] with "extensive testing".  He tried and failed klonopin.  Ultimately he received botulinum toxin injection by Dr. Doy Mince in 2008, with excellent results. The benefit lasted for 4 months. Unfortunately his insurance expired and he was no longer able to receive this treatment.  Now patient is having persistent symptoms with significant impairment of his vision, ability to read, ability to use a computer, and ability to drive.  UPDATE Dec 09 2014: Last EMG guided injection was in February 2016, he did very well, only mild right lower eyelid twitching occasionally  UPDATE Mar 31 2015: Previous EMG guided xeomin injection in May 2016 works very well for him, benefit last almost 4 month, only recently noticed mild recurrent right eye twitching, no significant side effect noticed  Update September 17 2015: Last EMG guided injection for his right hemifacial spasm was in September 2016, he tolerated the injection well, no significant side effect, the benefit last 4 months or longer, only recent few weeks, he noticed occasionally right facial twitching.  UPDATE January 08 2016:  He tolerated previous injection in March 2017, on in recent few weeks he noticed intermittent right eye twitching, occasionally right facial muscle twitching  UPDATE Dec 27th 2017: Last injection was 6 months ago, he responded very well only recently noticed recurrent right facial muscle twitching  Physical Exam   Cranial Nerves: He has occaional right lower eyelid twitching, mild static right ptosis,    Assessment and Plan: 75 year old  left-handed male with right hemifacial spasm.  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  Total 50 units of Xeomin/1 cc of NS, used 35  units, discard 15 units   Right orbicularis oculi at  2, 4 ,6, 8, 9 10  (2.5unit each)= 15 units Left orbicularis oculi at 2, 3,  2.5 unitsx 2=5 units  Right corrugate 5 Left corrugate 5 Procerus 5   Return to clinic in 3 months for repeat injections   [name], M.D. Ph.D.  [facility] [place], [place] [address] Phone: [phone] Fax:      [phone]

## 2016-07-27 DIAGNOSIS — M25561 Pain in right knee: Secondary | ICD-10-CM | POA: Diagnosis not present

## 2016-07-27 DIAGNOSIS — M25562 Pain in left knee: Secondary | ICD-10-CM | POA: Diagnosis not present

## 2016-07-27 DIAGNOSIS — M170 Bilateral primary osteoarthritis of knee: Secondary | ICD-10-CM | POA: Diagnosis not present

## 2016-07-27 DIAGNOSIS — R262 Difficulty in walking, not elsewhere classified: Secondary | ICD-10-CM | POA: Diagnosis not present

## 2016-08-23 DIAGNOSIS — H5213 Myopia, bilateral: Secondary | ICD-10-CM | POA: Diagnosis not present

## 2016-08-23 DIAGNOSIS — H2513 Age-related nuclear cataract, bilateral: Secondary | ICD-10-CM | POA: Diagnosis not present

## 2016-08-25 DIAGNOSIS — L03317 Cellulitis of buttock: Secondary | ICD-10-CM | POA: Diagnosis not present

## 2016-11-23 DIAGNOSIS — Z789 Other specified health status: Secondary | ICD-10-CM | POA: Diagnosis not present

## 2016-11-23 DIAGNOSIS — M25561 Pain in right knee: Secondary | ICD-10-CM | POA: Diagnosis not present

## 2016-11-23 DIAGNOSIS — M170 Bilateral primary osteoarthritis of knee: Secondary | ICD-10-CM | POA: Diagnosis not present

## 2016-11-23 DIAGNOSIS — M25562 Pain in left knee: Secondary | ICD-10-CM | POA: Diagnosis not present

## 2016-11-24 ENCOUNTER — Telehealth: Payer: Self-pay | Admitting: Neurology

## 2016-11-24 NOTE — Telephone Encounter (Signed)
Pt called to schedule botox appt

## 2016-11-26 NOTE — Telephone Encounter (Signed)
Pt is asking for a call back

## 2016-11-30 NOTE — Telephone Encounter (Signed)
I spoke with the patient and scheduled injection.  °

## 2016-11-30 NOTE — Telephone Encounter (Signed)
Patient called office in reference to Botox injection needing Dr. Krista Blue to contact [facility] to give approval to fill botox.

## 2016-12-06 NOTE — Telephone Encounter (Signed)
I have called [facility] to schedule delivery on the medication.

## 2016-12-15 ENCOUNTER — Ambulatory Visit: Payer: Medicare Other | Admitting: Neurology

## 2016-12-15 ENCOUNTER — Telehealth: Payer: Self-pay | Admitting: *Deleted

## 2016-12-15 NOTE — Telephone Encounter (Addendum)
Xeomin appt canceled by our office due to a delay in the delivery of his medication.

## 2016-12-16 ENCOUNTER — Encounter: Payer: Self-pay | Admitting: Neurology

## 2016-12-17 ENCOUNTER — Telehealth: Payer: Self-pay | Admitting: Neurology

## 2016-12-17 NOTE — Telephone Encounter (Signed)
I called [employer] to speak with them regarding the status of the patients Botox medication. They were waiting for it to arrive in office when I previously spoke to them. They have received the medication and called and left the patient a message so he could come pick it up.

## 2016-12-22 ENCOUNTER — Ambulatory Visit (INDEPENDENT_AMBULATORY_CARE_PROVIDER_SITE_OTHER): Payer: Medicare Other | Admitting: Neurology

## 2016-12-22 ENCOUNTER — Encounter: Payer: Self-pay | Admitting: Neurology

## 2016-12-22 ENCOUNTER — Telehealth: Payer: Self-pay | Admitting: Neurology

## 2016-12-22 VITALS — BP 129/79 | HR 63 | Ht 67.0 in | Wt 273.0 lb

## 2016-12-22 DIAGNOSIS — G5139 Clonic hemifacial spasm, unspecified: Secondary | ICD-10-CM

## 2016-12-22 DIAGNOSIS — G513 Clonic hemifacial spasm: Secondary | ICD-10-CM | POA: Diagnosis not present

## 2016-12-22 NOTE — Progress Notes (Signed)
**  Xeomin 50 units, [place] 0259-1605-01, Lot [id number], Exp 10/2018, specialty pharmacy.//mck,rn**

## 2016-12-26 NOTE — Progress Notes (Signed)
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   Since 2002, patient has had intermittent right eye and right facial twitching. This has gradually worsened over time. He was evaluated   in 2004 and diagnosed with hemifacial spasm. He had second opinion at [facility] with "extensive testing".  He tried and failed klonopin.  Ultimately he received botulinum toxin injection by Dr. Doy Mince in 2008, with excellent results. The benefit lasted for 4 months. Unfortunately his insurance expired and he was no longer able to receive this treatment.  Now patient is having persistent symptoms with significant impairment of his vision, ability to read, ability to use a computer, and ability to drive.  UPDATE Dec 09 2014: Last EMG guided injection was in February 2016, he did very well, only mild right lower eyelid twitching occasionally  UPDATE Mar 31 2015: Previous EMG guided xeomin injection in May 2016 works very well for him, benefit last almost 4 month, only recently noticed mild recurrent right eye twitching, no significant side effect noticed  Update September 17 2015: Last EMG guided injection for his right hemifacial spasm was in September 2016, he tolerated the injection well, no significant side effect, the benefit last 4 months or longer, only recent few weeks, he noticed occasionally right facial twitching.  UPDATE January 08 2016:  He tolerated previous injection in March 2017, on in recent few weeks he noticed intermittent right eye twitching, occasionally right facial muscle twitching  UPDATE Dec 27th 2017: Last injection was 6 months ago, he responded very well only recently noticed recurrent right facial muscle twitching  UPDATE December 22 2016: Last injection was almost 6 months ago, he responded to injection very well.  Physical Exam   Cranial Nerves: He has  occaional right lower eyelid twitching, mild static right ptosis,    Assessment and Plan: 76 year old left-handed male with right hemifacial spasm.  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  Total 50 units of Xeomin/1 cc of NS, used 35  units, discard 15 units  Right orbicularis oculi at  2, 4 ,6, 8, 9 10  (2.5unit each)= 15 units Left orbicularis oculi at 2, 3,  2.5 unitsx 2=5 units  Right corrugate 5 Left corrugate 5 Procerus 5   Return to clinic in 3 months for repeat injections   [name], M.D. Ph.D.  [facility] [place], [place] [address] Phone: [phone] Fax:      [phone]

## 2017-01-27 DIAGNOSIS — E039 Hypothyroidism, unspecified: Secondary | ICD-10-CM | POA: Diagnosis not present

## 2017-01-27 DIAGNOSIS — E1122 Type 2 diabetes mellitus with diabetic chronic kidney disease: Secondary | ICD-10-CM | POA: Diagnosis not present

## 2017-01-27 DIAGNOSIS — I10 Essential (primary) hypertension: Secondary | ICD-10-CM | POA: Diagnosis not present

## 2017-01-27 DIAGNOSIS — Z79899 Other long term (current) drug therapy: Secondary | ICD-10-CM | POA: Diagnosis not present

## 2017-01-27 DIAGNOSIS — Z0000 Encounter for general adult medical examination without abnormal findings: Secondary | ICD-10-CM | POA: Diagnosis not present

## 2017-01-27 DIAGNOSIS — N183 Chronic kidney disease, stage 3 (moderate): Secondary | ICD-10-CM | POA: Diagnosis not present

## 2017-01-27 DIAGNOSIS — M170 Bilateral primary osteoarthritis of knee: Secondary | ICD-10-CM | POA: Diagnosis not present

## 2017-01-27 DIAGNOSIS — Z905 Acquired absence of kidney: Secondary | ICD-10-CM | POA: Diagnosis not present

## 2017-01-27 DIAGNOSIS — I2510 Atherosclerotic heart disease of native coronary artery without angina pectoris: Secondary | ICD-10-CM | POA: Diagnosis not present

## 2017-01-27 DIAGNOSIS — Z9861 Coronary angioplasty status: Secondary | ICD-10-CM | POA: Diagnosis not present

## 2017-01-27 DIAGNOSIS — G4733 Obstructive sleep apnea (adult) (pediatric): Secondary | ICD-10-CM | POA: Diagnosis not present

## 2017-03-15 DIAGNOSIS — M170 Bilateral primary osteoarthritis of knee: Secondary | ICD-10-CM | POA: Diagnosis not present

## 2017-03-15 DIAGNOSIS — M25561 Pain in right knee: Secondary | ICD-10-CM | POA: Diagnosis not present

## 2017-03-15 DIAGNOSIS — R262 Difficulty in walking, not elsewhere classified: Secondary | ICD-10-CM | POA: Diagnosis not present

## 2017-03-15 DIAGNOSIS — M25562 Pain in left knee: Secondary | ICD-10-CM | POA: Diagnosis not present

## 2017-03-22 DIAGNOSIS — M25561 Pain in right knee: Secondary | ICD-10-CM | POA: Diagnosis not present

## 2017-03-22 DIAGNOSIS — M1711 Unilateral primary osteoarthritis, right knee: Secondary | ICD-10-CM | POA: Diagnosis not present

## 2017-03-23 DIAGNOSIS — M25562 Pain in left knee: Secondary | ICD-10-CM | POA: Diagnosis not present

## 2017-03-23 DIAGNOSIS — M1712 Unilateral primary osteoarthritis, left knee: Secondary | ICD-10-CM | POA: Diagnosis not present

## 2017-03-29 DIAGNOSIS — M1711 Unilateral primary osteoarthritis, right knee: Secondary | ICD-10-CM | POA: Diagnosis not present

## 2017-03-29 DIAGNOSIS — M25561 Pain in right knee: Secondary | ICD-10-CM | POA: Diagnosis not present

## 2017-03-30 DIAGNOSIS — M25562 Pain in left knee: Secondary | ICD-10-CM | POA: Diagnosis not present

## 2017-03-30 DIAGNOSIS — M1712 Unilateral primary osteoarthritis, left knee: Secondary | ICD-10-CM | POA: Diagnosis not present

## 2017-04-05 DIAGNOSIS — M1711 Unilateral primary osteoarthritis, right knee: Secondary | ICD-10-CM | POA: Diagnosis not present

## 2017-04-05 DIAGNOSIS — M25561 Pain in right knee: Secondary | ICD-10-CM | POA: Diagnosis not present

## 2017-04-06 DIAGNOSIS — M25562 Pain in left knee: Secondary | ICD-10-CM | POA: Diagnosis not present

## 2017-04-06 DIAGNOSIS — M1712 Unilateral primary osteoarthritis, left knee: Secondary | ICD-10-CM | POA: Diagnosis not present

## 2017-04-12 DIAGNOSIS — M25561 Pain in right knee: Secondary | ICD-10-CM | POA: Diagnosis not present

## 2017-04-12 DIAGNOSIS — M170 Bilateral primary osteoarthritis of knee: Secondary | ICD-10-CM | POA: Diagnosis not present

## 2017-04-12 DIAGNOSIS — M25562 Pain in left knee: Secondary | ICD-10-CM | POA: Diagnosis not present

## 2017-04-20 ENCOUNTER — Encounter: Payer: Self-pay | Admitting: Neurology

## 2017-04-20 ENCOUNTER — Telehealth: Payer: Self-pay | Admitting: Neurology

## 2017-04-20 ENCOUNTER — Ambulatory Visit (INDEPENDENT_AMBULATORY_CARE_PROVIDER_SITE_OTHER): Payer: Medicare Other | Admitting: Neurology

## 2017-04-20 VITALS — BP 133/86 | HR 74

## 2017-04-20 DIAGNOSIS — G5139 Clonic hemifacial spasm, unspecified: Secondary | ICD-10-CM

## 2017-04-20 DIAGNOSIS — G5133 Clonic hemifacial spasm, bilateral: Secondary | ICD-10-CM

## 2017-04-20 NOTE — Telephone Encounter (Signed)
Need inj in 3 mos

## 2017-04-20 NOTE — Progress Notes (Signed)
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   Since 2002, patient has had intermittent right eye and right facial twitching. This has gradually worsened over time. He was evaluated   in 2004 and diagnosed with hemifacial spasm. He had second opinion at [facility] with "extensive testing".  He tried and failed klonopin.  Ultimately he received botulinum toxin injection by Dr. Doy Mince in 2008, with excellent results. The benefit lasted for 4 months. Unfortunately his insurance expired and he was no longer able to receive this treatment.  Now patient is having persistent symptoms with significant impairment of his vision, ability to read, ability to use a computer, and ability to drive.  UPDATE Dec 09 2014: Last EMG guided injection was in February 2016, he did very well, only mild right lower eyelid twitching occasionally  UPDATE Mar 31 2015: Previous EMG guided xeomin injection in May 2016 works very well for him, benefit last almost 4 month, only recently noticed mild recurrent right eye twitching, no significant side effect noticed  Update September 17 2015: Last EMG guided injection for his right hemifacial spasm was in September 2016, he tolerated the injection well, no significant side effect, the benefit last 4 months or longer, only recent few weeks, he noticed occasionally right facial twitching.  UPDATE January 08 2016:  He tolerated previous injection in March 2017, on in recent few weeks he noticed intermittent right eye twitching, occasionally right facial muscle twitching  UPDATE Dec 27th 2017: Last injection was 6 months ago, he responded very well only recently noticed recurrent right facial muscle twitching  UPDATE December 22 2016: Last injection was almost 6 months ago, he responded to injection very well.  UPDATE Apr 20 2017: He responded very well to  previous injection in June.   Physical Exam   Cranial Nerves: He has occaional right lower eyelid twitching, mild static right ptosis,   Assessment and Plan: 76 year old left-handed male with right hemifacial spasm.  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  Total 50 units of Xeomin/1 cc of NS, used 35  units, discard 15 units  Right orbicularis oculi at  2, 4 ,6, 8, 9 10  (2.5unit each)= 15 units Left orbicularis oculi at 2, 3,  2.5 unitsx 2=5 units  Right corrugate 5 Left corrugate 5 Procerus 5   Return to clinic in 3 months for repeat injections   [name], M.D. Ph.D.  [facility] [place], [place] [address] Phone: [phone] Fax:      [phone]

## 2017-04-20 NOTE — Progress Notes (Signed)
**  Xeomin 50 units, Lot # [id number], Exp # [id number], [place] # [id number], SP**/  BC, RN

## 2017-04-26 NOTE — Telephone Encounter (Signed)
I called to schedule the patient, he did not answer so I left a VM asking him to call me back.

## 2017-05-03 DIAGNOSIS — I2510 Atherosclerotic heart disease of native coronary artery without angina pectoris: Secondary | ICD-10-CM | POA: Diagnosis not present

## 2017-05-03 DIAGNOSIS — E1165 Type 2 diabetes mellitus with hyperglycemia: Secondary | ICD-10-CM | POA: Diagnosis not present

## 2017-05-03 DIAGNOSIS — E559 Vitamin D deficiency, unspecified: Secondary | ICD-10-CM | POA: Diagnosis not present

## 2017-05-03 DIAGNOSIS — E1122 Type 2 diabetes mellitus with diabetic chronic kidney disease: Secondary | ICD-10-CM | POA: Diagnosis not present

## 2017-05-03 DIAGNOSIS — Z1211 Encounter for screening for malignant neoplasm of colon: Secondary | ICD-10-CM | POA: Diagnosis not present

## 2017-05-03 DIAGNOSIS — E1140 Type 2 diabetes mellitus with diabetic neuropathy, unspecified: Secondary | ICD-10-CM | POA: Diagnosis not present

## 2017-05-03 DIAGNOSIS — G479 Sleep disorder, unspecified: Secondary | ICD-10-CM | POA: Diagnosis not present

## 2017-05-03 DIAGNOSIS — L723 Sebaceous cyst: Secondary | ICD-10-CM | POA: Diagnosis not present

## 2017-07-29 DIAGNOSIS — R509 Fever, unspecified: Secondary | ICD-10-CM | POA: Diagnosis not present

## 2017-08-23 DIAGNOSIS — E119 Type 2 diabetes mellitus without complications: Secondary | ICD-10-CM | POA: Diagnosis not present

## 2017-08-23 DIAGNOSIS — H5213 Myopia, bilateral: Secondary | ICD-10-CM | POA: Diagnosis not present

## 2017-08-23 DIAGNOSIS — H2513 Age-related nuclear cataract, bilateral: Secondary | ICD-10-CM | POA: Diagnosis not present

## 2017-08-26 ENCOUNTER — Other Ambulatory Visit: Payer: Self-pay | Admitting: Neurology

## 2017-08-31 ENCOUNTER — Ambulatory Visit (INDEPENDENT_AMBULATORY_CARE_PROVIDER_SITE_OTHER): Payer: Medicare Other | Admitting: Neurology

## 2017-08-31 ENCOUNTER — Telehealth: Payer: Self-pay | Admitting: Neurology

## 2017-08-31 ENCOUNTER — Encounter: Payer: Self-pay | Admitting: Neurology

## 2017-08-31 VITALS — BP 113/72 | HR 78 | Ht 67.0 in | Wt 266.0 lb

## 2017-08-31 DIAGNOSIS — G5139 Clonic hemifacial spasm, unspecified: Secondary | ICD-10-CM | POA: Diagnosis not present

## 2017-08-31 DIAGNOSIS — G5133 Clonic hemifacial spasm, bilateral: Secondary | ICD-10-CM

## 2017-08-31 NOTE — Telephone Encounter (Signed)
I met with the patient and scheduled him.

## 2017-08-31 NOTE — Progress Notes (Signed)
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   Since 2002, patient has had intermittent right eye and right facial twitching. This has gradually worsened over time. He was evaluated   in 2004 and diagnosed with hemifacial spasm. He had second opinion at [facility] with "extensive testing".  He tried and failed klonopin.  Ultimately he received botulinum toxin injection by Dr. Doy Mince in 2008, with excellent results. The benefit lasted for 4 months. Unfortunately his insurance expired and he was no longer able to receive this treatment.  Now patient is having persistent symptoms with significant impairment of his vision, ability to read, ability to use a computer, and ability to drive.  UPDATE Dec 09 2014: Last EMG guided injection was in February 2016, he did very well, only mild right lower eyelid twitching occasionally  UPDATE Mar 31 2015: Previous EMG guided xeomin injection in May 2016 works very well for him, benefit last almost 4 month, only recently noticed mild recurrent right eye twitching, no significant side effect noticed  Update September 17 2015: Last EMG guided injection for his right hemifacial spasm was in September 2016, he tolerated the injection well, no significant side effect, the benefit last 4 months or longer, only recent few weeks, he noticed occasionally right facial twitching.  UPDATE January 08 2016:  He tolerated previous injection in March 2017, on in recent few weeks he noticed intermittent right eye twitching, occasionally right facial muscle twitching  UPDATE Dec 27th 2017: Last injection was 6 months ago, he responded very well only recently noticed recurrent right facial muscle twitching  UPDATE December 22 2016: Last injection was almost 6 months ago, he responded to injection very well.  UPDATE Apr 20 2017: He responded very well to  previous injection in June.  UPDATE Aug 31 2017: He is doing well, with last injection  Physical Exam   Cranial Nerves: He has occaional right lower eyelid twitching, mild static right ptosis,   Assessment and Plan: 77 year old left-handed male with right hemifacial spasm.  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  Total 50 units of Xeomin/1 cc of NS, used 35  units, discard 15 units  Right orbicularis oculi at  2, 4 ,6, 8, 9 10  (2.5unit each)= 15 units Left orbicularis oculi at 2, 3,  2.5 unitsx 2=5 units  Right corrugate 5 Left corrugate 5 Procerus 5   Return to clinic in 3 months for repeat injections   [name], M.D. Ph.D.  [facility] [place], [place] [address] Phone: [phone] Fax:      [phone]

## 2017-08-31 NOTE — Progress Notes (Signed)
**  Xeomin 50 units x 1 vial, NDC 1103-1594-58, Lot [id number], Exp 02/2019, specialty pharmacy.//mck,rn**

## 2017-08-31 NOTE — Telephone Encounter (Signed)
Pt. Needs 12 wk Xeomin inj

## 2017-10-19 DIAGNOSIS — Z789 Other specified health status: Secondary | ICD-10-CM | POA: Diagnosis not present

## 2017-10-19 DIAGNOSIS — M25562 Pain in left knee: Secondary | ICD-10-CM | POA: Diagnosis not present

## 2017-10-19 DIAGNOSIS — M25661 Stiffness of right knee, not elsewhere classified: Secondary | ICD-10-CM | POA: Diagnosis not present

## 2017-10-19 DIAGNOSIS — M1711 Unilateral primary osteoarthritis, right knee: Secondary | ICD-10-CM | POA: Diagnosis not present

## 2017-10-19 DIAGNOSIS — M21162 Varus deformity, not elsewhere classified, left knee: Secondary | ICD-10-CM | POA: Diagnosis not present

## 2017-10-19 DIAGNOSIS — R269 Unspecified abnormalities of gait and mobility: Secondary | ICD-10-CM | POA: Diagnosis not present

## 2017-10-19 DIAGNOSIS — M25561 Pain in right knee: Secondary | ICD-10-CM | POA: Diagnosis not present

## 2017-10-19 DIAGNOSIS — M25662 Stiffness of left knee, not elsewhere classified: Secondary | ICD-10-CM | POA: Diagnosis not present

## 2017-10-19 DIAGNOSIS — M21161 Varus deformity, not elsewhere classified, right knee: Secondary | ICD-10-CM | POA: Diagnosis not present

## 2017-10-19 DIAGNOSIS — M170 Bilateral primary osteoarthritis of knee: Secondary | ICD-10-CM | POA: Diagnosis not present

## 2017-10-20 DIAGNOSIS — M1712 Unilateral primary osteoarthritis, left knee: Secondary | ICD-10-CM | POA: Diagnosis not present

## 2017-10-20 DIAGNOSIS — M25562 Pain in left knee: Secondary | ICD-10-CM | POA: Diagnosis not present

## 2017-10-26 DIAGNOSIS — M1711 Unilateral primary osteoarthritis, right knee: Secondary | ICD-10-CM | POA: Diagnosis not present

## 2017-10-26 DIAGNOSIS — M25561 Pain in right knee: Secondary | ICD-10-CM | POA: Diagnosis not present

## 2017-10-27 DIAGNOSIS — M25562 Pain in left knee: Secondary | ICD-10-CM | POA: Diagnosis not present

## 2017-10-27 DIAGNOSIS — M1712 Unilateral primary osteoarthritis, left knee: Secondary | ICD-10-CM | POA: Diagnosis not present

## 2017-11-02 DIAGNOSIS — M1711 Unilateral primary osteoarthritis, right knee: Secondary | ICD-10-CM | POA: Diagnosis not present

## 2017-11-02 DIAGNOSIS — M25561 Pain in right knee: Secondary | ICD-10-CM | POA: Diagnosis not present

## 2017-11-03 DIAGNOSIS — M1712 Unilateral primary osteoarthritis, left knee: Secondary | ICD-10-CM | POA: Diagnosis not present

## 2017-11-03 DIAGNOSIS — M25562 Pain in left knee: Secondary | ICD-10-CM | POA: Diagnosis not present

## 2017-11-09 DIAGNOSIS — M25561 Pain in right knee: Secondary | ICD-10-CM | POA: Diagnosis not present

## 2017-11-09 DIAGNOSIS — M1711 Unilateral primary osteoarthritis, right knee: Secondary | ICD-10-CM | POA: Diagnosis not present

## 2017-11-10 DIAGNOSIS — M1712 Unilateral primary osteoarthritis, left knee: Secondary | ICD-10-CM | POA: Diagnosis not present

## 2017-11-10 DIAGNOSIS — M25562 Pain in left knee: Secondary | ICD-10-CM | POA: Diagnosis not present

## 2017-11-16 DIAGNOSIS — M1712 Unilateral primary osteoarthritis, left knee: Secondary | ICD-10-CM | POA: Diagnosis not present

## 2017-11-16 DIAGNOSIS — M25562 Pain in left knee: Secondary | ICD-10-CM | POA: Diagnosis not present

## 2017-11-17 DIAGNOSIS — M1711 Unilateral primary osteoarthritis, right knee: Secondary | ICD-10-CM | POA: Diagnosis not present

## 2017-11-17 DIAGNOSIS — M25561 Pain in right knee: Secondary | ICD-10-CM | POA: Diagnosis not present

## 2017-11-23 ENCOUNTER — Telehealth: Payer: Self-pay | Admitting: Neurology

## 2017-11-23 NOTE — Telephone Encounter (Signed)
I called and requested a refill for the patient.

## 2017-11-24 NOTE — Telephone Encounter (Signed)
I called and relayed to the pharmacy that I obtained the PA.

## 2017-11-30 ENCOUNTER — Telehealth: Payer: Self-pay

## 2017-11-30 ENCOUNTER — Ambulatory Visit: Payer: Medicare Other | Admitting: Neurology

## 2017-11-30 NOTE — Telephone Encounter (Signed)
Patient was a no show for Xeomin injections today.

## 2017-12-01 ENCOUNTER — Encounter: Payer: Self-pay | Admitting: Neurology

## 2017-12-01 NOTE — Telephone Encounter (Signed)
Pt called he missed the appt yesterday bc his daughter had a procedure and he forgot. He said since he forgot about the appt he forgot to pick up the med also. He has decided he will call back next week when he knows when he can pick up the medication. FYI

## 2017-12-01 NOTE — Telephone Encounter (Signed)
Noted, thank you

## 2017-12-05 NOTE — Telephone Encounter (Signed)
Noted! Thank you

## 2017-12-07 NOTE — Telephone Encounter (Signed)
Pt is asking for a call from [name] re: being scheduled for his injection.  Please call

## 2017-12-08 NOTE — Telephone Encounter (Signed)
Noted, thank you

## 2017-12-08 NOTE — Telephone Encounter (Signed)
I called the patient to schedule him. He did not answer so I left a VM asking him to call me back.

## 2017-12-08 NOTE — Telephone Encounter (Signed)
Pt scheduled xeomin for 6/13  [facility]

## 2017-12-29 ENCOUNTER — Ambulatory Visit (INDEPENDENT_AMBULATORY_CARE_PROVIDER_SITE_OTHER): Payer: Medicare Other | Admitting: Neurology

## 2017-12-29 ENCOUNTER — Encounter: Payer: Self-pay | Admitting: Neurology

## 2017-12-29 VITALS — BP 119/73 | HR 62 | Ht 67.0 in | Wt 269.0 lb

## 2017-12-29 DIAGNOSIS — G5133 Clonic hemifacial spasm, bilateral: Secondary | ICD-10-CM

## 2017-12-29 DIAGNOSIS — G5139 Clonic hemifacial spasm, unspecified: Secondary | ICD-10-CM | POA: Diagnosis not present

## 2017-12-29 NOTE — Progress Notes (Signed)
**  Xeomin 50 units x 1 vial, NDC 5379-4327-61, Lot [id number], Exp 06/2019, specialty pharmacy.//mck,rn**

## 2017-12-29 NOTE — Progress Notes (Signed)
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   Since 2002, patient has had intermittent right eye and right facial twitching. This has gradually worsened over time. He was evaluated   in 2004 and diagnosed with hemifacial spasm. He had second opinion at [facility] with "extensive testing".  He tried and failed klonopin.  Ultimately he received botulinum toxin injection by Dr. Doy Mince in 2008, with excellent results. The benefit lasted for 4 months. Unfortunately his insurance expired and he was no longer able to receive this treatment.  Now patient is having persistent symptoms with significant impairment of his vision, ability to read, ability to use a computer, and ability to drive.  UPDATE Dec 09 2014: Last EMG guided injection was in February 2016, he did very well, only mild right lower eyelid twitching occasionally  UPDATE Mar 31 2015: Previous EMG guided xeomin injection in May 2016 works very well for him, benefit last almost 4 month, only recently noticed mild recurrent right eye twitching, no significant side effect noticed  Update September 17 2015: Last EMG guided injection for his right hemifacial spasm was in September 2016, he tolerated the injection well, no significant side effect, the benefit last 4 months or longer, only recent few weeks, he noticed occasionally right facial twitching.  UPDATE January 08 2016:  He tolerated previous injection in March 2017, on in recent few weeks he noticed intermittent right eye twitching, occasionally right facial muscle twitching  UPDATE Dec 27th 2017: Last injection was 6 months ago, he responded very well only recently noticed recurrent right facial muscle twitching  UPDATE December 22 2016: Last injection was almost 6 months ago, he responded to injection very well.  UPDATE Apr 20 2017: He responded very well to  previous injection in June.  UPDATE Aug 31 2017: He is doing well, with last injection  UPDATE December 29 2017: He did well with last injection 4 months ago  Physical Exam   Cranial Nerves: He has occaional right lower eyelid twitching, mild static right ptosis,   Assessment and Plan: 77 year old left-handed male with right hemifacial spasm.  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  Total 50 units of Xeomin/1 cc of NS, used 40 units, discard 10 units  Right orbicularis oculi at  2, 4, 5, ,6, 8, 9, 10 (2.5unit each)x7 = 17.5 units Left orbicularis oculi at 2, 3,4,   2.5 unitsx 3=7.5 units  Right corrugate 5 Left corrugate 5 Procerus 5   Return to clinic in 3 months for repeat injections   [name], M.D. Ph.D.  [facility] [place], [place] [address] Phone: [phone] Fax:      [phone]

## 2018-03-14 DIAGNOSIS — G629 Polyneuropathy, unspecified: Secondary | ICD-10-CM | POA: Diagnosis not present

## 2018-03-14 DIAGNOSIS — G4733 Obstructive sleep apnea (adult) (pediatric): Secondary | ICD-10-CM | POA: Diagnosis not present

## 2018-03-14 DIAGNOSIS — C641 Malignant neoplasm of right kidney, except renal pelvis: Secondary | ICD-10-CM | POA: Diagnosis not present

## 2018-03-14 DIAGNOSIS — N183 Chronic kidney disease, stage 3 (moderate): Secondary | ICD-10-CM | POA: Diagnosis not present

## 2018-03-14 DIAGNOSIS — Z0000 Encounter for general adult medical examination without abnormal findings: Secondary | ICD-10-CM | POA: Diagnosis not present

## 2018-03-14 DIAGNOSIS — Z1211 Encounter for screening for malignant neoplasm of colon: Secondary | ICD-10-CM | POA: Diagnosis not present

## 2018-03-14 DIAGNOSIS — E559 Vitamin D deficiency, unspecified: Secondary | ICD-10-CM | POA: Diagnosis not present

## 2018-03-14 DIAGNOSIS — I129 Hypertensive chronic kidney disease with stage 1 through stage 4 chronic kidney disease, or unspecified chronic kidney disease: Secondary | ICD-10-CM | POA: Diagnosis not present

## 2018-03-14 DIAGNOSIS — E1140 Type 2 diabetes mellitus with diabetic neuropathy, unspecified: Secondary | ICD-10-CM | POA: Diagnosis not present

## 2018-03-14 DIAGNOSIS — E039 Hypothyroidism, unspecified: Secondary | ICD-10-CM | POA: Diagnosis not present

## 2018-03-14 DIAGNOSIS — E782 Mixed hyperlipidemia: Secondary | ICD-10-CM | POA: Diagnosis not present

## 2018-03-14 DIAGNOSIS — Z79899 Other long term (current) drug therapy: Secondary | ICD-10-CM | POA: Diagnosis not present

## 2018-03-14 DIAGNOSIS — Z23 Encounter for immunization: Secondary | ICD-10-CM | POA: Diagnosis not present

## 2018-04-05 ENCOUNTER — Telehealth: Payer: Self-pay | Admitting: Neurology

## 2018-04-05 ENCOUNTER — Ambulatory Visit: Payer: Medicare Other | Admitting: Neurology

## 2018-04-05 NOTE — Telephone Encounter (Signed)
I called [facility] at [phone] to request a refill on the patients Botox medication. They stated that the medication was ready to pick up. I called the patient to make him aware and he stated that he would go pick it up from the pharmacy before his apt.

## 2018-04-12 DIAGNOSIS — M25562 Pain in left knee: Secondary | ICD-10-CM | POA: Diagnosis not present

## 2018-04-12 DIAGNOSIS — M170 Bilateral primary osteoarthritis of knee: Secondary | ICD-10-CM | POA: Diagnosis not present

## 2018-04-12 DIAGNOSIS — M25561 Pain in right knee: Secondary | ICD-10-CM | POA: Diagnosis not present

## 2018-04-13 ENCOUNTER — Ambulatory Visit (INDEPENDENT_AMBULATORY_CARE_PROVIDER_SITE_OTHER): Payer: Medicare Other | Admitting: Neurology

## 2018-04-13 ENCOUNTER — Encounter: Payer: Self-pay | Admitting: Neurology

## 2018-04-13 VITALS — BP 143/83 | HR 62 | Ht 67.0 in | Wt 273.0 lb

## 2018-04-13 DIAGNOSIS — G5133 Clonic hemifacial spasm, bilateral: Secondary | ICD-10-CM | POA: Diagnosis not present

## 2018-04-13 DIAGNOSIS — G5139 Clonic hemifacial spasm, unspecified: Secondary | ICD-10-CM | POA: Diagnosis not present

## 2018-04-13 NOTE — Progress Notes (Signed)
**  Xeomin 50 units x 1 vial, NDC 2229-7989-21, Lot [id number], Exp 09/2019, specialty pharmacy.//mck,rn**

## 2018-04-13 NOTE — Progress Notes (Signed)
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   Since 2002, patient has had intermittent right eye and right facial twitching. This has gradually worsened over time. He was evaluated   in 2004 and diagnosed with hemifacial spasm. He had second opinion at [facility] with "extensive testing".  He tried and failed klonopin.  Ultimately he received botulinum toxin injection by Dr. Doy Mince in 2008, with excellent results. The benefit lasted for 4 months. Unfortunately his insurance expired and he was no longer able to receive this treatment.  Now patient is having persistent symptoms with significant impairment of his vision, ability to read, ability to use a computer, and ability to drive.  UPDATE Dec 09 2014: Last EMG guided injection was in February 2016, he did very well, only mild right lower eyelid twitching occasionally  UPDATE Mar 31 2015: Previous EMG guided xeomin injection in May 2016 works very well for him, benefit last almost 4 month, only recently noticed mild recurrent right eye twitching, no significant side effect noticed  Update September 17 2015: Last EMG guided injection for his right hemifacial spasm was in September 2016, he tolerated the injection well, no significant side effect, the benefit last 4 months or longer, only recent few weeks, he noticed occasionally right facial twitching.  UPDATE January 08 2016:  He tolerated previous injection in March 2017, on in recent few weeks he noticed intermittent right eye twitching, occasionally right facial muscle twitching  UPDATE Dec 27th 2017: Last injection was 6 months ago, he responded very well only recently noticed recurrent right facial muscle twitching  UPDATE December 22 2016: Last injection was almost 6 months ago, he responded to injection very well.  UPDATE Apr 20 2017: He responded very well to  previous injection in June.  UPDATE Aug 31 2017: He is doing well, with last injection  UPDATE December 29 2017: He did well with last injection 4 months ago  UPDATE Sept 26 2019: He did well with previous injection, has mild right ptosis,  Physical Exam   Cranial Nerves: He has occaional right lower eyelid twitching, mild static right ptosis,   Assessment and Plan: 77 year old left-handed male with right hemifacial spasm.  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  Total 50 units of Xeomin/1 cc of NS, used 32.5 units, discard 17.5 units  Right orbicularis oculi at  2, 5, ,6, 8, 9,  (2.5unit each)x 5 = 12.5 units Left orbicularis oculi at 2, 3, 4, 6  2.5 unitsx 4= 10 units  Right corrugate 2.5 Left corrugate 2.5 Procerus 5   Return to clinic in 3 months for repeat injections   [name], M.D. Ph.D.  [facility] [place], [place] [address] Phone: [phone] Fax:      [phone]

## 2018-05-24 DIAGNOSIS — M170 Bilateral primary osteoarthritis of knee: Secondary | ICD-10-CM | POA: Diagnosis not present

## 2018-05-24 DIAGNOSIS — M25562 Pain in left knee: Secondary | ICD-10-CM | POA: Diagnosis not present

## 2018-05-24 DIAGNOSIS — M1711 Unilateral primary osteoarthritis, right knee: Secondary | ICD-10-CM | POA: Diagnosis not present

## 2018-05-24 DIAGNOSIS — M25561 Pain in right knee: Secondary | ICD-10-CM | POA: Diagnosis not present

## 2018-05-25 DIAGNOSIS — M25562 Pain in left knee: Secondary | ICD-10-CM | POA: Diagnosis not present

## 2018-05-25 DIAGNOSIS — M1712 Unilateral primary osteoarthritis, left knee: Secondary | ICD-10-CM | POA: Diagnosis not present

## 2018-05-29 DIAGNOSIS — M1711 Unilateral primary osteoarthritis, right knee: Secondary | ICD-10-CM | POA: Diagnosis not present

## 2018-05-29 DIAGNOSIS — M25561 Pain in right knee: Secondary | ICD-10-CM | POA: Diagnosis not present

## 2018-05-31 DIAGNOSIS — M25562 Pain in left knee: Secondary | ICD-10-CM | POA: Diagnosis not present

## 2018-05-31 DIAGNOSIS — M1712 Unilateral primary osteoarthritis, left knee: Secondary | ICD-10-CM | POA: Diagnosis not present

## 2018-06-05 DIAGNOSIS — M1711 Unilateral primary osteoarthritis, right knee: Secondary | ICD-10-CM | POA: Diagnosis not present

## 2018-06-05 DIAGNOSIS — M25561 Pain in right knee: Secondary | ICD-10-CM | POA: Diagnosis not present

## 2018-06-06 DIAGNOSIS — M1712 Unilateral primary osteoarthritis, left knee: Secondary | ICD-10-CM | POA: Diagnosis not present

## 2018-06-06 DIAGNOSIS — M25562 Pain in left knee: Secondary | ICD-10-CM | POA: Diagnosis not present

## 2018-06-12 DIAGNOSIS — M1711 Unilateral primary osteoarthritis, right knee: Secondary | ICD-10-CM | POA: Diagnosis not present

## 2018-06-12 DIAGNOSIS — M25561 Pain in right knee: Secondary | ICD-10-CM | POA: Diagnosis not present

## 2018-06-13 DIAGNOSIS — M25562 Pain in left knee: Secondary | ICD-10-CM | POA: Diagnosis not present

## 2018-06-13 DIAGNOSIS — M1712 Unilateral primary osteoarthritis, left knee: Secondary | ICD-10-CM | POA: Diagnosis not present

## 2018-06-19 DIAGNOSIS — M1711 Unilateral primary osteoarthritis, right knee: Secondary | ICD-10-CM | POA: Diagnosis not present

## 2018-06-19 DIAGNOSIS — M25561 Pain in right knee: Secondary | ICD-10-CM | POA: Diagnosis not present

## 2018-06-20 DIAGNOSIS — M1712 Unilateral primary osteoarthritis, left knee: Secondary | ICD-10-CM | POA: Diagnosis not present

## 2018-06-20 DIAGNOSIS — M25562 Pain in left knee: Secondary | ICD-10-CM | POA: Diagnosis not present

## 2018-07-05 ENCOUNTER — Telehealth: Payer: Self-pay | Admitting: Neurology

## 2018-07-05 NOTE — Telephone Encounter (Signed)
I called [place] to make sure his medication was ready. It is ready to be picked up.

## 2018-07-10 ENCOUNTER — Ambulatory Visit (INDEPENDENT_AMBULATORY_CARE_PROVIDER_SITE_OTHER): Payer: Medicare Other | Admitting: Neurology

## 2018-07-10 ENCOUNTER — Encounter: Payer: Self-pay | Admitting: Neurology

## 2018-07-10 VITALS — BP 128/82 | HR 61 | Ht 67.0 in | Wt 267.0 lb

## 2018-07-10 DIAGNOSIS — G5139 Clonic hemifacial spasm, unspecified: Secondary | ICD-10-CM

## 2018-07-10 DIAGNOSIS — G5133 Clonic hemifacial spasm, bilateral: Secondary | ICD-10-CM

## 2018-07-10 NOTE — Progress Notes (Signed)
**  Xeomin 50 units x 1 vial, NDC 0223-3612-24, Lot  [id number], Exp 12/2020, specialty pharmacy.//mck,rn**

## 2018-07-10 NOTE — Progress Notes (Signed)
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   Since 2002, patient has had intermittent right eye and right facial twitching. This has gradually worsened over time. He was evaluated   in 2004 and diagnosed with hemifacial spasm. He had second opinion at [facility] with "extensive testing".  He tried and failed klonopin.  Ultimately he received botulinum toxin injection by Dr. Doy Mince in 2008, with excellent results. The benefit lasted for 4 months. Unfortunately his insurance expired and he was no longer able to receive this treatment.  Now patient is having persistent symptoms with significant impairment of his vision, ability to read, ability to use a computer, and ability to drive.  UPDATE Dec 09 2014: Last EMG guided injection was in February 2016, he did very well, only mild right lower eyelid twitching occasionally  UPDATE Mar 31 2015: Previous EMG guided xeomin injection in May 2016 works very well for him, benefit last almost 4 month, only recently noticed mild recurrent right eye twitching, no significant side effect noticed  Update September 17 2015: Last EMG guided injection for his right hemifacial spasm was in September 2016, he tolerated the injection well, no significant side effect, the benefit last 4 months or longer, only recent few weeks, he noticed occasionally right facial twitching.  UPDATE January 08 2016:  He tolerated previous injection in March 2017, on in recent few weeks he noticed intermittent right eye twitching, occasionally right facial muscle twitching  UPDATE Dec 27th 2017: Last injection was 6 months ago, he responded very well only recently noticed recurrent right facial muscle twitching  UPDATE December 22 2016: Last injection was almost 6 months ago, he responded to injection very well.  UPDATE Apr 20 2017: He responded very well to  previous injection in June.  UPDATE Aug 31 2017: He is doing well, with last injection  UPDATE December 29 2017: He did well with last injection 4 months ago  UPDATE Sept 26 2019: He did well with previous injection, has mild right ptosis,  UPDATE Jul 10 2018  he responded very well to previous injection, there is no significant side effect noted  Physical Exam   Cranial Nerves: He has occaional right lower eyelid twitching, mild static right ptosis, mild right eye closure weakness  Assessment and Plan: 77 year old left-handed male with right hemifacial spasm.  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  Total 50 units of Xeomin/1 cc of NS, used 30 units, discard 20  units  Right orbicularis oculi at  2, 5, ,6 7, 8, 9,  (2.5unit each)x 6 = 15 units Left orbicularis oculi at  3, 4  2.5 unitsx2 = 5 units  Right corrugate 2.5 Left corrugate 2.5 Procerus 5   Return to clinic in 3 months for repeat injections   [name], M.D. Ph.D.  [facility] [place], [place] [address] Phone: [phone] Fax:      [phone]

## 2018-07-13 ENCOUNTER — Ambulatory Visit: Payer: Self-pay | Admitting: Neurology

## 2018-08-29 DIAGNOSIS — E119 Type 2 diabetes mellitus without complications: Secondary | ICD-10-CM | POA: Diagnosis not present

## 2018-10-04 ENCOUNTER — Other Ambulatory Visit: Payer: Self-pay | Admitting: Neurology

## 2018-10-09 ENCOUNTER — Telehealth: Payer: Self-pay | Admitting: Neurology

## 2018-10-09 NOTE — Telephone Encounter (Signed)
I spoke with the patient to explain the cancellation of his apt. I also called his pharmacy and confirmed that his medication was ready for pick up.

## 2018-10-11 ENCOUNTER — Ambulatory Visit: Payer: Self-pay | Admitting: Neurology

## 2018-10-16 NOTE — Telephone Encounter (Signed)
I have called the patient twice to see if he can come in tomorrow for his injection. He did not answer so I left a VM asking him to call me back.

## 2018-10-18 DIAGNOSIS — N183 Chronic kidney disease, stage 3 (moderate): Secondary | ICD-10-CM | POA: Diagnosis not present

## 2018-10-18 DIAGNOSIS — G4733 Obstructive sleep apnea (adult) (pediatric): Secondary | ICD-10-CM | POA: Diagnosis not present

## 2018-10-18 DIAGNOSIS — G2581 Restless legs syndrome: Secondary | ICD-10-CM | POA: Diagnosis not present

## 2018-10-19 NOTE — Telephone Encounter (Signed)
I spoke with the patient regarding rescheduling his injection. I explained to him our office protocol and told him that if he felt he could wait for his injection that may be the better option right now, he was ok with waiting for his injections. We scheduled his apt for the end of May. The patient was agreeable with waiting.

## 2018-12-05 ENCOUNTER — Telehealth: Payer: Self-pay | Admitting: Neurology

## 2018-12-05 NOTE — Telephone Encounter (Signed)
I called and spoke with the patient regarding moving his apt. Please schedule on May 27th at 12. Patient has been waiting since March for this apt. DW

## 2018-12-05 NOTE — Telephone Encounter (Signed)
I spoke to the patient and we were able to offer him an earlier appt.  He has been scheduled on 12/06/2018 at noon.  He will arrived at 11:45am and the new check-in process has been reviewed with him.

## 2018-12-06 ENCOUNTER — Ambulatory Visit: Payer: Self-pay | Admitting: Neurology

## 2018-12-06 ENCOUNTER — Other Ambulatory Visit: Payer: Self-pay

## 2018-12-06 ENCOUNTER — Encounter: Payer: Self-pay | Admitting: Neurology

## 2018-12-06 ENCOUNTER — Ambulatory Visit (INDEPENDENT_AMBULATORY_CARE_PROVIDER_SITE_OTHER): Payer: Medicare Other | Admitting: Neurology

## 2018-12-06 VITALS — BP 148/80 | HR 61 | Temp 97.1°F | Ht 67.0 in | Wt 275.5 lb

## 2018-12-06 DIAGNOSIS — G5139 Clonic hemifacial spasm, unspecified: Secondary | ICD-10-CM

## 2018-12-06 DIAGNOSIS — G5133 Clonic hemifacial spasm, bilateral: Secondary | ICD-10-CM

## 2018-12-06 NOTE — Progress Notes (Signed)
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   Since 2002, patient has had intermittent right eye and right facial twitching. This has gradually worsened over time. He was evaluated   in 2004 and diagnosed with hemifacial spasm. He had second opinion at [facility] with "extensive testing".  He tried and failed klonopin.  Ultimately he received botulinum toxin injection by Dr. Doy Mince in 2008, with excellent results. The benefit lasted for 4 months. Unfortunately his insurance expired and he was no longer able to receive this treatment.  Now patient is having persistent symptoms with significant impairment of his vision, ability to read, ability to use a computer, and ability to drive.  UPDATE Dec 09 2014: Last EMG guided injection was in February 2016, he did very well, only mild right lower eyelid twitching occasionally  UPDATE Mar 31 2015: Previous EMG guided xeomin injection in May 2016 works very well for him, benefit last almost 4 month, only recently noticed mild recurrent right eye twitching, no significant side effect noticed  Update September 17 2015: Last EMG guided injection for his right hemifacial spasm was in September 2016, he tolerated the injection well, no significant side effect, the benefit last 4 months or longer, only recent few weeks, he noticed occasionally right facial twitching.  UPDATE January 08 2016:  He tolerated previous injection in March 2017, on in recent few weeks he noticed intermittent right eye twitching, occasionally right facial muscle twitching  UPDATE Dec 27th 2017: Last injection was 6 months ago, he responded very well only recently noticed recurrent right facial muscle twitching  UPDATE December 22 2016: Last injection was almost 6 months ago, he responded to injection very well.  UPDATE Apr 20 2017: He responded very well to  previous injection in June.  UPDATE Aug 31 2017: He is doing well, with last injection  UPDATE December 29 2017: He did well with last injection 4 months ago  UPDATE Sept 26 2019: He did well with previous injection, has mild right ptosis,  UPDATE Jul 10 2018  he responded very well to previous injection, there is no significant side effect noted  UPDATE Dec 06 2018: He did well with previous injection  Physical Exam   Cranial Nerves: He has occaional right lower eyelid twitching, mild static right ptosis, mild right eye closure weakness  Assessment and Plan: 78 year old left-handed male with right hemifacial spasm.  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  Total 50 units of Xeomin/1 cc of NS, used 30 units, discard 20  units  Right orbicularis oculi at  2, 5, ,6 7, 8, 9,  (2.5unit each)x 6 = 15 units Left orbicularis oculi at  3, 4  2.5 unitsx2 = 5 units  Right corrugate 2.5 Left corrugate 2.5 Procerus 5  Return to clinic in 3 months for repeat injections   [name], M.D. Ph.D.  [facility] [place], [place] [address] Phone: [phone] Fax:      [phone]

## 2018-12-06 NOTE — Progress Notes (Signed)
**  Xeomin 50 units, [place] [id number], Lot [id number], Exp 12/2020, specialty pharmacy.//mck,rn**

## 2018-12-07 ENCOUNTER — Ambulatory Visit: Payer: Medicare Other | Admitting: Neurology

## 2018-12-14 DIAGNOSIS — M25562 Pain in left knee: Secondary | ICD-10-CM | POA: Diagnosis not present

## 2018-12-14 DIAGNOSIS — M25561 Pain in right knee: Secondary | ICD-10-CM | POA: Diagnosis not present

## 2018-12-14 DIAGNOSIS — M170 Bilateral primary osteoarthritis of knee: Secondary | ICD-10-CM | POA: Diagnosis not present

## 2018-12-20 DIAGNOSIS — M25561 Pain in right knee: Secondary | ICD-10-CM | POA: Diagnosis not present

## 2018-12-20 DIAGNOSIS — M1711 Unilateral primary osteoarthritis, right knee: Secondary | ICD-10-CM | POA: Diagnosis not present

## 2018-12-21 DIAGNOSIS — M25562 Pain in left knee: Secondary | ICD-10-CM | POA: Diagnosis not present

## 2018-12-21 DIAGNOSIS — M1712 Unilateral primary osteoarthritis, left knee: Secondary | ICD-10-CM | POA: Diagnosis not present

## 2018-12-27 DIAGNOSIS — M25561 Pain in right knee: Secondary | ICD-10-CM | POA: Diagnosis not present

## 2018-12-27 DIAGNOSIS — M1711 Unilateral primary osteoarthritis, right knee: Secondary | ICD-10-CM | POA: Diagnosis not present

## 2018-12-28 DIAGNOSIS — M25562 Pain in left knee: Secondary | ICD-10-CM | POA: Diagnosis not present

## 2018-12-28 DIAGNOSIS — M1712 Unilateral primary osteoarthritis, left knee: Secondary | ICD-10-CM | POA: Diagnosis not present

## 2019-01-03 DIAGNOSIS — M25561 Pain in right knee: Secondary | ICD-10-CM | POA: Diagnosis not present

## 2019-01-03 DIAGNOSIS — M1711 Unilateral primary osteoarthritis, right knee: Secondary | ICD-10-CM | POA: Diagnosis not present

## 2019-01-04 DIAGNOSIS — M25562 Pain in left knee: Secondary | ICD-10-CM | POA: Diagnosis not present

## 2019-01-04 DIAGNOSIS — M1712 Unilateral primary osteoarthritis, left knee: Secondary | ICD-10-CM | POA: Diagnosis not present

## 2019-01-10 DIAGNOSIS — M1711 Unilateral primary osteoarthritis, right knee: Secondary | ICD-10-CM | POA: Diagnosis not present

## 2019-01-10 DIAGNOSIS — M25561 Pain in right knee: Secondary | ICD-10-CM | POA: Diagnosis not present

## 2019-01-11 DIAGNOSIS — M25562 Pain in left knee: Secondary | ICD-10-CM | POA: Diagnosis not present

## 2019-01-11 DIAGNOSIS — M1712 Unilateral primary osteoarthritis, left knee: Secondary | ICD-10-CM | POA: Diagnosis not present

## 2019-01-17 DIAGNOSIS — M25562 Pain in left knee: Secondary | ICD-10-CM | POA: Diagnosis not present

## 2019-01-17 DIAGNOSIS — M1712 Unilateral primary osteoarthritis, left knee: Secondary | ICD-10-CM | POA: Diagnosis not present

## 2019-01-18 DIAGNOSIS — M25562 Pain in left knee: Secondary | ICD-10-CM | POA: Diagnosis not present

## 2019-01-18 DIAGNOSIS — M1712 Unilateral primary osteoarthritis, left knee: Secondary | ICD-10-CM | POA: Diagnosis not present

## 2019-02-16 DIAGNOSIS — J302 Other seasonal allergic rhinitis: Secondary | ICD-10-CM | POA: Diagnosis not present

## 2019-03-14 ENCOUNTER — Other Ambulatory Visit: Payer: Self-pay

## 2019-03-14 ENCOUNTER — Encounter: Payer: Self-pay | Admitting: Neurology

## 2019-03-14 ENCOUNTER — Ambulatory Visit (INDEPENDENT_AMBULATORY_CARE_PROVIDER_SITE_OTHER): Payer: Medicare Other | Admitting: Neurology

## 2019-03-14 VITALS — BP 91/60 | HR 72 | Temp 98.4°F | Ht 67.0 in | Wt 270.0 lb

## 2019-03-14 DIAGNOSIS — G5139 Clonic hemifacial spasm, unspecified: Secondary | ICD-10-CM

## 2019-03-14 DIAGNOSIS — G5133 Clonic hemifacial spasm, bilateral: Secondary | ICD-10-CM | POA: Diagnosis not present

## 2019-03-14 NOTE — Progress Notes (Signed)
HPI: 78-year-old left-handed male with history of hypertension, hypercholesterolemia, coronary artery disease, here for EMG guided BOTOX injection for right hemifacial spasm.   Since 2002, patient has had intermittent right eye and right facial twitching. This has gradually worsened over time. He was evaluated   in 2004 and diagnosed with hemifacial spasm. He had second opinion at [facility] with "extensive testing".  He tried and failed klonopin.  Ultimately he received botulinum toxin injection by Dr. Reynolds in 2008, with excellent results. The benefit lasted for 4 months. Unfortunately his insurance expired and he was no longer able to receive this treatment.  Now patient is having persistent symptoms with significant impairment of his vision, ability to read, ability to use a computer, and ability to drive.  UPDATE Dec 09 2014: Last EMG guided injection was in February 2016, he did very well, only mild right lower eyelid twitching occasionally  UPDATE Mar 31 2015: Previous EMG guided xeomin injection in May 2016 works very well for him, benefit last almost 4 month, only recently noticed mild recurrent right eye twitching, no significant side effect noticed  Update September 17 2015: Last EMG guided injection for his right hemifacial spasm was in September 2016, he tolerated the injection well, no significant side effect, the benefit last 4 months or longer, only recent few weeks, he noticed occasionally right facial twitching.  UPDATE January 08 2016:  He tolerated previous injection in March 2017, on in recent few weeks he noticed intermittent right eye twitching, occasionally right facial muscle twitching  UPDATE Dec 27th 2017: Last injection was 6 months ago, he responded very well only recently noticed recurrent right facial muscle twitching  UPDATE December 22 2016: Last injection was almost 6 months ago, he responded to injection very well.  UPDATE Apr 20 2017: He responded very well to  previous injection in June.  UPDATE Aug 31 2017: He is doing well, with last injection  UPDATE December 29 2017: He did well with last injection 4 months ago  UPDATE Sept 26 2019: He did well with previous injection, has mild right ptosis,  UPDATE Jul 10 2018  he responded very well to previous injection, there is no significant side effect noted  UPDATE Dec 06 2018: He did well with previous injection  Physical Exam   Cranial Nerves: He has occaional right lower eyelid twitching, mild static right ptosis, mild right eye closure weakness  Assessment and Plan: 78-year-old left-handed male with right hemifacial spasm.  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  Total 50 units of Xeomin/1 cc of NS, used 30 units, discard 20  units  Right orbicularis oculi at  2, 5, ,6 7, 8, 9,  (2.5unit each)x 6 = 15 units Left orbicularis oculi at  3, 4  2.5 unitsx2 = 5 units  Right corrugate 2.5 Left corrugate 2.5 Procerus 5  Return to clinic in 3 months for repeat injections   Salar Molden, M.D. Ph.D.  [facility] [address] [place], [place] [address] Phone: [phone] Fax:      [phone]   

## 2019-03-14 NOTE — Progress Notes (Signed)
**  Xeomin 50 units x 1 vial, NDC LF:9003806, Lot [id number], Exp 12/2020, specialty pharmacy.//mck,rn**

## 2019-04-19 DIAGNOSIS — M170 Bilateral primary osteoarthritis of knee: Secondary | ICD-10-CM | POA: Diagnosis not present

## 2019-04-19 DIAGNOSIS — R269 Unspecified abnormalities of gait and mobility: Secondary | ICD-10-CM | POA: Diagnosis not present

## 2019-04-19 DIAGNOSIS — M25561 Pain in right knee: Secondary | ICD-10-CM | POA: Diagnosis not present

## 2019-04-19 DIAGNOSIS — M25361 Other instability, right knee: Secondary | ICD-10-CM | POA: Diagnosis not present

## 2019-04-19 DIAGNOSIS — M21162 Varus deformity, not elsewhere classified, left knee: Secondary | ICD-10-CM | POA: Diagnosis not present

## 2019-04-19 DIAGNOSIS — R262 Difficulty in walking, not elsewhere classified: Secondary | ICD-10-CM | POA: Diagnosis not present

## 2019-04-19 DIAGNOSIS — M21161 Varus deformity, not elsewhere classified, right knee: Secondary | ICD-10-CM | POA: Diagnosis not present

## 2019-04-19 DIAGNOSIS — M25661 Stiffness of right knee, not elsewhere classified: Secondary | ICD-10-CM | POA: Diagnosis not present

## 2019-04-19 DIAGNOSIS — M25562 Pain in left knee: Secondary | ICD-10-CM | POA: Diagnosis not present

## 2019-04-27 DIAGNOSIS — Z23 Encounter for immunization: Secondary | ICD-10-CM | POA: Diagnosis not present

## 2019-05-22 DIAGNOSIS — E039 Hypothyroidism, unspecified: Secondary | ICD-10-CM | POA: Diagnosis not present

## 2019-05-22 DIAGNOSIS — N1831 Chronic kidney disease, stage 3a: Secondary | ICD-10-CM | POA: Diagnosis not present

## 2019-05-22 DIAGNOSIS — Z79899 Other long term (current) drug therapy: Secondary | ICD-10-CM | POA: Diagnosis not present

## 2019-05-22 DIAGNOSIS — Z0000 Encounter for general adult medical examination without abnormal findings: Secondary | ICD-10-CM | POA: Diagnosis not present

## 2019-05-22 DIAGNOSIS — M109 Gout, unspecified: Secondary | ICD-10-CM | POA: Diagnosis not present

## 2019-05-22 DIAGNOSIS — E1122 Type 2 diabetes mellitus with diabetic chronic kidney disease: Secondary | ICD-10-CM | POA: Diagnosis not present

## 2019-05-22 DIAGNOSIS — I10 Essential (primary) hypertension: Secondary | ICD-10-CM | POA: Diagnosis not present

## 2019-05-22 DIAGNOSIS — E782 Mixed hyperlipidemia: Secondary | ICD-10-CM | POA: Diagnosis not present

## 2019-05-22 DIAGNOSIS — N183 Chronic kidney disease, stage 3 unspecified: Secondary | ICD-10-CM | POA: Diagnosis not present

## 2019-05-22 DIAGNOSIS — Z23 Encounter for immunization: Secondary | ICD-10-CM | POA: Diagnosis not present

## 2019-05-22 DIAGNOSIS — Z905 Acquired absence of kidney: Secondary | ICD-10-CM | POA: Diagnosis not present

## 2019-06-20 ENCOUNTER — Ambulatory Visit (INDEPENDENT_AMBULATORY_CARE_PROVIDER_SITE_OTHER): Payer: Medicare Other | Admitting: Neurology

## 2019-06-20 ENCOUNTER — Encounter: Payer: Self-pay | Admitting: Neurology

## 2019-06-20 ENCOUNTER — Other Ambulatory Visit: Payer: Self-pay

## 2019-06-20 VITALS — BP 133/69 | HR 64 | Temp 98.2°F | Ht 67.0 in | Wt 271.5 lb

## 2019-06-20 DIAGNOSIS — G5131 Clonic hemifacial spasm, right: Secondary | ICD-10-CM

## 2019-06-20 NOTE — Progress Notes (Signed)
**  Xeomin 50 units x 1 vial, NDC LF:9003806, Lot [id number], Exp 06/2021, specialty pharmacy.//mck,rn**

## 2019-06-20 NOTE — Progress Notes (Signed)
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   Since 2002, patient has had intermittent right eye and right facial twitching. This has gradually worsened over time. He was evaluated   in 2004 and diagnosed with hemifacial spasm. He had second opinion at [facility] with "extensive testing".  He tried and failed klonopin.  Ultimately he received botulinum toxin injection by Dr. Doy Mince in 2008, with excellent results. The benefit lasted for 4 months. Unfortunately his insurance expired and he was no longer able to receive this treatment.  Now patient is having persistent symptoms with significant impairment of his vision, ability to read, ability to use a computer, and ability to drive.  UPDATE Dec 09 2014: Last EMG guided injection was in February 2016, he did very well, only mild right lower eyelid twitching occasionally  UPDATE Mar 31 2015: Previous EMG guided xeomin injection in May 2016 works very well for him, benefit last almost 4 month, only recently noticed mild recurrent right eye twitching, no significant side effect noticed  Update September 17 2015: Last EMG guided injection for his right hemifacial spasm was in September 2016, he tolerated the injection well, no significant side effect, the benefit last 4 months or longer, only recent few weeks, he noticed occasionally right facial twitching.  UPDATE January 08 2016:  He tolerated previous injection in March 2017, on in recent few weeks he noticed intermittent right eye twitching, occasionally right facial muscle twitching  UPDATE Dec 27th 2017: Last injection was 6 months ago, he responded very well only recently noticed recurrent right facial muscle twitching  UPDATE December 22 2016: Last injection was almost 6 months ago, he responded to injection very well.  UPDATE Apr 20 2017: He responded very well to  previous injection in June.  UPDATE Aug 31 2017: He is doing well, with last injection  UPDATE December 29 2017: He did well with last injection 4 months ago  UPDATE Sept 26 2019: He did well with previous injection, has mild right ptosis,  UPDATE Jul 10 2018  he responded very well to previous injection, there is no significant side effect noted  UPDATE Dec 06 2018: He did well with previous injection  UPDATE Jun 20 2019: He did well with previous injections  Physical Exam   Cranial Nerves: He has occaional right lower eyelid twitching, mild static right ptosis, mild right eye closure weakness  Assessment and Plan: 78 year old left-handed male with right hemifacial spasm.  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  Total 50 units of Xeomin/1 cc of NS, used 35 units, discard 15  units  Right orbicularis oculi at  2, 5, ,6 7, 8, 9,  (2.5unit each)x 6 = 15 units Left orbicularis oculi at  3, 4  2.5 unitsx2 = 5 units  Right corrugate 2.5 units Left corrugate 2.5 units Procerus 5  Right frontalis 2.5 Left frontalis 2.5 units  Return to clinic in 3 months for repeat injections   [name], M.D. Ph.D.  [facility] [place], [place] [address] Phone: [phone] Fax:      [phone]

## 2019-08-08 DIAGNOSIS — M1711 Unilateral primary osteoarthritis, right knee: Secondary | ICD-10-CM | POA: Diagnosis not present

## 2019-08-08 DIAGNOSIS — M25561 Pain in right knee: Secondary | ICD-10-CM | POA: Diagnosis not present

## 2019-08-09 DIAGNOSIS — M1712 Unilateral primary osteoarthritis, left knee: Secondary | ICD-10-CM | POA: Diagnosis not present

## 2019-08-09 DIAGNOSIS — M25562 Pain in left knee: Secondary | ICD-10-CM | POA: Diagnosis not present

## 2019-08-15 DIAGNOSIS — M25561 Pain in right knee: Secondary | ICD-10-CM | POA: Diagnosis not present

## 2019-08-15 DIAGNOSIS — M170 Bilateral primary osteoarthritis of knee: Secondary | ICD-10-CM | POA: Diagnosis not present

## 2019-08-16 DIAGNOSIS — M170 Bilateral primary osteoarthritis of knee: Secondary | ICD-10-CM | POA: Diagnosis not present

## 2019-08-16 DIAGNOSIS — M25562 Pain in left knee: Secondary | ICD-10-CM | POA: Diagnosis not present

## 2019-08-22 DIAGNOSIS — M25561 Pain in right knee: Secondary | ICD-10-CM | POA: Diagnosis not present

## 2019-08-22 DIAGNOSIS — M170 Bilateral primary osteoarthritis of knee: Secondary | ICD-10-CM | POA: Diagnosis not present

## 2019-08-23 DIAGNOSIS — M25562 Pain in left knee: Secondary | ICD-10-CM | POA: Diagnosis not present

## 2019-08-23 DIAGNOSIS — M170 Bilateral primary osteoarthritis of knee: Secondary | ICD-10-CM | POA: Diagnosis not present

## 2019-08-29 DIAGNOSIS — M25561 Pain in right knee: Secondary | ICD-10-CM | POA: Diagnosis not present

## 2019-08-29 DIAGNOSIS — M170 Bilateral primary osteoarthritis of knee: Secondary | ICD-10-CM | POA: Diagnosis not present

## 2019-08-30 DIAGNOSIS — M1712 Unilateral primary osteoarthritis, left knee: Secondary | ICD-10-CM | POA: Diagnosis not present

## 2019-08-30 DIAGNOSIS — M25562 Pain in left knee: Secondary | ICD-10-CM | POA: Diagnosis not present

## 2019-08-31 DIAGNOSIS — H2513 Age-related nuclear cataract, bilateral: Secondary | ICD-10-CM | POA: Diagnosis not present

## 2019-08-31 DIAGNOSIS — H5213 Myopia, bilateral: Secondary | ICD-10-CM | POA: Diagnosis not present

## 2019-08-31 DIAGNOSIS — E119 Type 2 diabetes mellitus without complications: Secondary | ICD-10-CM | POA: Diagnosis not present

## 2019-09-05 DIAGNOSIS — M1711 Unilateral primary osteoarthritis, right knee: Secondary | ICD-10-CM | POA: Diagnosis not present

## 2019-09-05 DIAGNOSIS — M25561 Pain in right knee: Secondary | ICD-10-CM | POA: Diagnosis not present

## 2019-09-26 ENCOUNTER — Other Ambulatory Visit: Payer: Self-pay

## 2019-09-26 ENCOUNTER — Encounter: Payer: Self-pay | Admitting: Neurology

## 2019-09-26 ENCOUNTER — Ambulatory Visit (INDEPENDENT_AMBULATORY_CARE_PROVIDER_SITE_OTHER): Payer: Medicare Other | Admitting: Neurology

## 2019-09-26 VITALS — BP 99/58 | HR 61 | Temp 97.7°F | Ht 67.0 in | Wt 267.0 lb

## 2019-09-26 DIAGNOSIS — G5131 Clonic hemifacial spasm, right: Secondary | ICD-10-CM

## 2019-09-26 NOTE — Progress Notes (Signed)
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   Since 2002, patient has had intermittent right eye and right facial twitching. This has gradually worsened over time. He was evaluated   in 2004 and diagnosed with hemifacial spasm. He had second opinion at [facility] with "extensive testing".  He tried and failed klonopin.  Ultimately he received botulinum toxin injection by Dr. Doy Mince in 2008, with excellent results. The benefit lasted for 4 months. Unfortunately his insurance expired and he was no longer able to receive this treatment.  Now patient is having persistent symptoms with significant impairment of his vision, ability to read, ability to use a computer, and ability to drive.  UPDATE Dec 09 2014: Last EMG guided injection was in February 2016, he did very well, only mild right lower eyelid twitching occasionally  UPDATE Mar 31 2015: Previous EMG guided xeomin injection in May 2016 works very well for him, benefit last almost 4 month, only recently noticed mild recurrent right eye twitching, no significant side effect noticed  Update September 17 2015: Last EMG guided injection for his right hemifacial spasm was in September 2016, he tolerated the injection well, no significant side effect, the benefit last 4 months or longer, only recent few weeks, he noticed occasionally right facial twitching.  UPDATE January 08 2016:  He tolerated previous injection in March 2017, on in recent few weeks he noticed intermittent right eye twitching, occasionally right facial muscle twitching  UPDATE Dec 27th 2017: Last injection was 6 months ago, he responded very well only recently noticed recurrent right facial muscle twitching  UPDATE December 22 2016: Last injection was almost 6 months ago, he responded to injection very well.  UPDATE Apr 20 2017: He responded very well to  previous injection in June.  UPDATE Aug 31 2017: He is doing well, with last injection  UPDATE December 29 2017: He did well with last injection 4 months ago  UPDATE Sept 26 2019: He did well with previous injection, has mild right ptosis,  UPDATE Jul 10 2018  he responded very well to previous injection, there is no significant side effect noted  UPDATE Dec 06 2018: He did well with previous injection  UPDATE Jun 20 2019: He did well with previous injections  UPDATE September 26 2019: He responded well to previous injection  Physical Exam   Cranial Nerves: He has occaional right lower eyelid twitching, mild static right ptosis, mild right eye closure weakness  Assessment and Plan: 79 year old left-handed male with right hemifacial spasm.  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  Total 50 units of Xeomin/1 cc of NS, used 20 units, discard 30 units  Right orbicularis oculi at  2, 5, ,6 7, 8, 9,  (2.5unit each)x 6 = 15 units Left orbicularis oculi at  3, 4  2.5 unitsx2 = 5 units     Return to clinic in 3 months for repeat injections   [name], M.D. Ph.D.  [facility] [place], [place] [address] Phone: [phone] Fax:      [phone]

## 2019-09-26 NOTE — Progress Notes (Signed)
**  Xeomin 50 units x 1 vial, NDC [id number]:[id number], Lot [id number], Exp 06/2021, specialty pharmacy.//mck,rn**

## 2019-12-28 ENCOUNTER — Other Ambulatory Visit: Payer: Self-pay | Admitting: Neurology

## 2020-01-02 ENCOUNTER — Encounter: Payer: Self-pay | Admitting: Neurology

## 2020-01-02 ENCOUNTER — Ambulatory Visit (INDEPENDENT_AMBULATORY_CARE_PROVIDER_SITE_OTHER): Payer: Medicare Other | Admitting: Neurology

## 2020-01-02 VITALS — BP 107/63 | HR 58 | Ht 67.0 in | Wt 268.5 lb

## 2020-01-02 DIAGNOSIS — G5131 Clonic hemifacial spasm, right: Secondary | ICD-10-CM

## 2020-01-02 NOTE — Progress Notes (Signed)
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   Since 2002, patient has had intermittent right eye and right facial twitching. This has gradually worsened over time. He was evaluated   in 2004 and diagnosed with hemifacial spasm. He had second opinion at [facility] with "extensive testing".  He tried and failed klonopin.  Ultimately he received botulinum toxin injection by Dr. Doy Mince in 2008, with excellent results. The benefit lasted for 4 months. Unfortunately his insurance expired and he was no longer able to receive this treatment.  Now patient is having persistent symptoms with significant impairment of his vision, ability to read, ability to use a computer, and ability to drive.  UPDATE Dec 09 2014: Last EMG guided injection was in February 2016, he did very well, only mild right lower eyelid twitching occasionally  UPDATE Mar 31 2015: Previous EMG guided xeomin injection in May 2016 works very well for him, benefit last almost 4 month, only recently noticed mild recurrent right eye twitching, no significant side effect noticed  Update September 17 2015: Last EMG guided injection for his right hemifacial spasm was in September 2016, he tolerated the injection well, no significant side effect, the benefit last 4 months or longer, only recent few weeks, he noticed occasionally right facial twitching.  UPDATE January 08 2016:  He tolerated previous injection in March 2017, on in recent few weeks he noticed intermittent right eye twitching, occasionally right facial muscle twitching  UPDATE Dec 27th 2017: Last injection was 6 months ago, he responded very well only recently noticed recurrent right facial muscle twitching  UPDATE December 22 2016: Last injection was almost 6 months ago, he responded to injection very well.  UPDATE Apr 20 2017: He responded very well to  previous injection in June.  UPDATE Aug 31 2017: He is doing well, with last injection  UPDATE December 29 2017: He did well with last injection 4 months ago  UPDATE Sept 26 2019: He did well with previous injection, has mild right ptosis,  UPDATE Jul 10 2018  he responded very well to previous injection, there is no significant side effect noted  UPDATE Dec 06 2018: He did well with previous injection  UPDATE Jun 20 2019: He did well with previous injections  UPDATE September 26 2019: He responded well to previous injection  Physical Exam   Cranial Nerves: He has occaional right lower eyelid twitching, mild static right ptosis, mild right eye closure weakness  Assessment and Plan: 79 year old left-handed male with right hemifacial spasm.  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  Total 50 units of Xeomin/1 cc of NS, used 20 units, discard 30 units  Right orbicularis oculi at  2, 5, ,6 7, 8, 9,  (2.5unit each)x 6 = 15 units Left orbicularis oculi at  3, 4  2.5 unitsx2 = 5 units     Return to clinic in 3 months for repeat injections   [name], M.D. Ph.D.  [facility] [place], [place] [address] Phone: [phone] Fax:      [phone]

## 2020-01-02 NOTE — Progress Notes (Signed)
**  Xeomin 50 units x 1 vial, NDC 5809-9833-82, Lot [id number], Exp 10/2021, specialty pharmacy.//mck,rn**

## 2020-02-27 DIAGNOSIS — M25561 Pain in right knee: Secondary | ICD-10-CM | POA: Diagnosis not present

## 2020-02-27 DIAGNOSIS — M25562 Pain in left knee: Secondary | ICD-10-CM | POA: Diagnosis not present

## 2020-02-27 DIAGNOSIS — M1712 Unilateral primary osteoarthritis, left knee: Secondary | ICD-10-CM | POA: Diagnosis not present

## 2020-02-27 DIAGNOSIS — M170 Bilateral primary osteoarthritis of knee: Secondary | ICD-10-CM | POA: Diagnosis not present

## 2020-02-29 DIAGNOSIS — M25561 Pain in right knee: Secondary | ICD-10-CM | POA: Diagnosis not present

## 2020-02-29 DIAGNOSIS — M1711 Unilateral primary osteoarthritis, right knee: Secondary | ICD-10-CM | POA: Diagnosis not present

## 2020-03-06 DIAGNOSIS — M25562 Pain in left knee: Secondary | ICD-10-CM | POA: Diagnosis not present

## 2020-03-06 DIAGNOSIS — M1712 Unilateral primary osteoarthritis, left knee: Secondary | ICD-10-CM | POA: Diagnosis not present

## 2020-03-12 DIAGNOSIS — M25561 Pain in right knee: Secondary | ICD-10-CM | POA: Diagnosis not present

## 2020-03-12 DIAGNOSIS — M1711 Unilateral primary osteoarthritis, right knee: Secondary | ICD-10-CM | POA: Diagnosis not present

## 2020-03-13 DIAGNOSIS — M25562 Pain in left knee: Secondary | ICD-10-CM | POA: Diagnosis not present

## 2020-03-13 DIAGNOSIS — M1712 Unilateral primary osteoarthritis, left knee: Secondary | ICD-10-CM | POA: Diagnosis not present

## 2020-03-19 DIAGNOSIS — M25561 Pain in right knee: Secondary | ICD-10-CM | POA: Diagnosis not present

## 2020-03-19 DIAGNOSIS — M1711 Unilateral primary osteoarthritis, right knee: Secondary | ICD-10-CM | POA: Diagnosis not present

## 2020-03-20 DIAGNOSIS — M25562 Pain in left knee: Secondary | ICD-10-CM | POA: Diagnosis not present

## 2020-03-20 DIAGNOSIS — M1712 Unilateral primary osteoarthritis, left knee: Secondary | ICD-10-CM | POA: Diagnosis not present

## 2020-03-27 DIAGNOSIS — M25561 Pain in right knee: Secondary | ICD-10-CM | POA: Diagnosis not present

## 2020-03-27 DIAGNOSIS — M1711 Unilateral primary osteoarthritis, right knee: Secondary | ICD-10-CM | POA: Diagnosis not present

## 2020-03-28 DIAGNOSIS — M25562 Pain in left knee: Secondary | ICD-10-CM | POA: Diagnosis not present

## 2020-03-28 DIAGNOSIS — M1712 Unilateral primary osteoarthritis, left knee: Secondary | ICD-10-CM | POA: Diagnosis not present

## 2020-04-02 DIAGNOSIS — M25561 Pain in right knee: Secondary | ICD-10-CM | POA: Diagnosis not present

## 2020-04-02 DIAGNOSIS — M1711 Unilateral primary osteoarthritis, right knee: Secondary | ICD-10-CM | POA: Diagnosis not present

## 2020-04-07 ENCOUNTER — Ambulatory Visit: Payer: Medicare Other | Admitting: Neurology

## 2020-04-07 ENCOUNTER — Telehealth: Payer: Self-pay | Admitting: Neurology

## 2020-04-07 NOTE — Telephone Encounter (Signed)
Spoke with Lovena Le. Dr. Krista Blue had opening tomorrow but this would not work ask the pharmacy told pt it would be a few days before the medication would arrive

## 2020-04-07 NOTE — Telephone Encounter (Signed)
Patient called today before his appointment and stated that his Xeomin was not at the pharmacy for him to pick up (patient brings his own medication) and he will need to reschedule. I told the patient we would call him back to reschedule his appointment because I was not sure where to place him on the schedule.  Do you know where I could schedule patient?

## 2020-04-22 NOTE — Telephone Encounter (Signed)
I called the patient to see if his Xeomin is in yet. Dr. Krista Blue has an opening for Botox tomorrow at 1:30. I offered this to the patient but he states the pharmacy could not guarantee that they would have the medication by then. I advised him that I would keep the spot on hold and asked him to call me if he is able to come in.

## 2020-04-23 ENCOUNTER — Ambulatory Visit (INDEPENDENT_AMBULATORY_CARE_PROVIDER_SITE_OTHER): Payer: Medicare Other | Admitting: Neurology

## 2020-04-23 ENCOUNTER — Encounter: Payer: Self-pay | Admitting: Neurology

## 2020-04-23 VITALS — BP 119/68 | HR 56 | Ht 67.0 in | Wt 264.5 lb

## 2020-04-23 DIAGNOSIS — G5131 Clonic hemifacial spasm, right: Secondary | ICD-10-CM | POA: Diagnosis not present

## 2020-04-23 NOTE — Progress Notes (Signed)
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   Since 2002, patient has had intermittent right eye and right facial twitching. This has gradually worsened over time. He was evaluated   in 2004 and diagnosed with hemifacial spasm. He had second opinion at [facility] with "extensive testing".  He tried and failed klonopin.  Ultimately he received botulinum toxin injection by Dr. Doy Mince in 2008, with excellent results. The benefit lasted for 4 months. Unfortunately his insurance expired and he was no longer able to receive this treatment.  Now patient is having persistent symptoms with significant impairment of his vision, ability to read, ability to use a computer, and ability to drive.  UPDATE Dec 09 2014: Last EMG guided injection was in February 2016, he did very well, only mild right lower eyelid twitching occasionally  UPDATE Mar 31 2015: Previous EMG guided xeomin injection in May 2016 works very well for him, benefit last almost 4 month, only recently noticed mild recurrent right eye twitching, no significant side effect noticed  Update September 17 2015: Last EMG guided injection for his right hemifacial spasm was in September 2016, he tolerated the injection well, no significant side effect, the benefit last 4 months or longer, only recent few weeks, he noticed occasionally right facial twitching.  UPDATE January 08 2016:  He tolerated previous injection in March 2017, on in recent few weeks he noticed intermittent right eye twitching, occasionally right facial muscle twitching  UPDATE Dec 27th 2017: Last injection was 6 months ago, he responded very well only recently noticed recurrent right facial muscle twitching  UPDATE December 22 2016: Last injection was almost 6 months ago, he responded to injection very well.  UPDATE Apr 20 2017: He responded very well to  previous injection in June.  UPDATE Aug 31 2017: He is doing well, with last injection  UPDATE December 29 2017: He did well with last injection 4 months ago  UPDATE Sept 26 2019: He did well with previous injection, has mild right ptosis,  UPDATE Jul 10 2018  he responded very well to previous injection, there is no significant side effect noted  UPDATE Dec 06 2018: He did well with previous injection  UPDATE Jun 20 2019: He did well with previous injections  UPDATE September 26 2019: He responded well to previous injection  UPDATE Apr 23 2020: He responded well to previous injections  Physical Exam   Cranial Nerves: He has occaional right lower eyelid twitching, mild static right ptosis, mild right eye closure weakness  Assessment and Plan: 79 year old left-handed male with right hemifacial spasm.  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  Total 50 units of Xeomin/1 cc of NS, used 20 units, discard 30 units  Right orbicularis oculi at  2, 5, ,6 7, 8, 9,  (2.5unit each)x 6 = 15 units Left orbicularis oculi at  3, 4  2.5 unitsx2 = 5 units    Return to clinic in 3 months for repeat injections   [name], M.D. Ph.D.  [facility] [place], [place] [address] Phone: [phone] Fax:      [phone]

## 2020-04-23 NOTE — Progress Notes (Signed)
**  Xeomin 50 units x 1 vial, NDC 9102-8902-28, Lot [id number], Exp 10/2021, specialty pharmacy.//mck,rn**

## 2020-05-14 DIAGNOSIS — B351 Tinea unguium: Secondary | ICD-10-CM | POA: Diagnosis not present

## 2020-05-14 DIAGNOSIS — J069 Acute upper respiratory infection, unspecified: Secondary | ICD-10-CM | POA: Diagnosis not present

## 2020-06-03 DIAGNOSIS — I2510 Atherosclerotic heart disease of native coronary artery without angina pectoris: Secondary | ICD-10-CM | POA: Diagnosis not present

## 2020-06-03 DIAGNOSIS — E559 Vitamin D deficiency, unspecified: Secondary | ICD-10-CM | POA: Diagnosis not present

## 2020-06-03 DIAGNOSIS — Z79899 Other long term (current) drug therapy: Secondary | ICD-10-CM | POA: Diagnosis not present

## 2020-06-03 DIAGNOSIS — R202 Paresthesia of skin: Secondary | ICD-10-CM | POA: Diagnosis not present

## 2020-06-03 DIAGNOSIS — Z0000 Encounter for general adult medical examination without abnormal findings: Secondary | ICD-10-CM | POA: Diagnosis not present

## 2020-06-03 DIAGNOSIS — Z905 Acquired absence of kidney: Secondary | ICD-10-CM | POA: Diagnosis not present

## 2020-06-03 DIAGNOSIS — E039 Hypothyroidism, unspecified: Secondary | ICD-10-CM | POA: Diagnosis not present

## 2020-06-03 DIAGNOSIS — E1122 Type 2 diabetes mellitus with diabetic chronic kidney disease: Secondary | ICD-10-CM | POA: Diagnosis not present

## 2020-06-03 DIAGNOSIS — N1831 Chronic kidney disease, stage 3a: Secondary | ICD-10-CM | POA: Diagnosis not present

## 2020-06-03 DIAGNOSIS — Z23 Encounter for immunization: Secondary | ICD-10-CM | POA: Diagnosis not present

## 2020-06-03 DIAGNOSIS — G4733 Obstructive sleep apnea (adult) (pediatric): Secondary | ICD-10-CM | POA: Diagnosis not present

## 2020-06-03 DIAGNOSIS — E782 Mixed hyperlipidemia: Secondary | ICD-10-CM | POA: Diagnosis not present

## 2020-06-10 DIAGNOSIS — Z23 Encounter for immunization: Secondary | ICD-10-CM | POA: Diagnosis not present

## 2020-07-31 ENCOUNTER — Telehealth: Payer: Self-pay

## 2020-07-31 NOTE — Telephone Encounter (Signed)
Called to discuss with patient about COVID-19 symptoms and the use of one of the available treatments for those with mild to moderate Covid symptoms and at a high risk of hospitalization.  Pt appears to qualify for outpatient treatment due to co-morbid conditions and/or a member of an at-risk group in accordance with the FDA Emergency Use Authorization.    Symptom onset: Unknown Vaccinated: Unknown Booster? Unknown Immunocompromised? No Qualifiers: CAD, HTN.  Unable to reach pt - Unable to leave message, voice mailbox is full.   Thomas Michael

## 2020-08-06 ENCOUNTER — Ambulatory Visit: Payer: Self-pay | Admitting: Neurology

## 2020-08-06 ENCOUNTER — Telehealth: Payer: Self-pay | Admitting: Neurology

## 2020-08-06 NOTE — Telephone Encounter (Signed)
Patient was supposed to have Xeomin injection today. He called yesterday and LVM stating he has Covid. I called the patient back this morning and again this afternoon to reschedule but VM is full. I have placed a hold on the 1:00 on 2/14 for patient to reschedule. Patient brings his medication in from a pharmacy for injections.

## 2020-08-11 NOTE — Telephone Encounter (Signed)
Patient returned my call. He agreed to come in 2/14 at 1:00. I confirmed with patient that he will bring his own Xeomin from his pharmacy.

## 2020-08-13 DIAGNOSIS — Z03818 Encounter for observation for suspected exposure to other biological agents ruled out: Secondary | ICD-10-CM | POA: Diagnosis not present

## 2020-08-13 DIAGNOSIS — Z20822 Contact with and (suspected) exposure to covid-19: Secondary | ICD-10-CM | POA: Diagnosis not present

## 2020-08-25 ENCOUNTER — Ambulatory Visit (INDEPENDENT_AMBULATORY_CARE_PROVIDER_SITE_OTHER): Payer: Medicare Other | Admitting: Neurology

## 2020-08-25 ENCOUNTER — Other Ambulatory Visit: Payer: Self-pay

## 2020-08-25 DIAGNOSIS — M79604 Pain in right leg: Secondary | ICD-10-CM

## 2020-08-25 DIAGNOSIS — M79605 Pain in left leg: Secondary | ICD-10-CM

## 2020-08-25 DIAGNOSIS — R269 Unspecified abnormalities of gait and mobility: Secondary | ICD-10-CM

## 2020-08-25 MED ORDER — DULOXETINE HCL 30 MG PO CPEP: 30.0000 mg | ORAL_CAPSULE | Freq: Every day | ORAL | 3 refills | Status: DC

## 2020-08-25 NOTE — Procedures (Signed)
Full Name: Thomas Michael Gender: Male MRN #: [id number] Date of Birth: 02-Sep-1940    Visit Date: 08/25/2020 07:39 Age: 80 Years Examining Physician: [name], MD  Referring Physician: [name], MD History: 80 year old male with history of obesity, gait abnormality, frequent lower extremity pain  Summary of the test:  Nerve conduction study: Bilateral sural, superficial peroneal sensory responses were normal, bilateral peroneal to EDB and tibial motor responses were normal  Electromyography:  Selected needle examination of bilateral lower extremity muscles and bilateral lumbosacral paraspinal muscles were normal.   Conclusion: This is a normal study.  There is no electrodiagnostic evidence of peripheral neuropathy or bilateral lumbosacral radiculopathy.    ------------------------------- [name], M.D. PhD  [facility] [address] [place], [place] [address] Tel: [phone] Fax: [phone]  Verbal informed consent was obtained from the patient, patient was informed of potential risk of procedure, including bruising, bleeding, hematoma formation, infection, muscle weakness, muscle pain, numbness, among others.        [place]    Nerve / Sites Muscle Latency Ref. Amplitude Ref. Rel Amp Segments Distance Velocity Ref. Area    ms ms mV mV %  cm m/s m/s mVms  L Peroneal - EDB     Ankle EDB 4.5 ?6.5 5.5 ?2.0 100 Ankle - EDB 9   15.2     Fib head EDB 10.9  4.2  76.4 Fib head - Ankle 28 44 ?44 14.0     Pop fossa EDB 13.2  3.7  88 Pop fossa - Fib head 10 44 ?44 12.6         Pop fossa - Ankle      R Peroneal - EDB     Ankle EDB 5.4 ?6.5 7.6 ?2.0 100 Ankle - EDB 9   18.5     Fib head EDB 12.0  6.9  91.1 Fib head - Ankle 29 44 ?44 18.6     Pop fossa EDB 14.3  6.4  92.4 Pop fossa - Fib head 10 44 ?44 17.2         Pop fossa - Ankle      L Tibial - AH     Ankle AH 4.4 ?5.8 4.2 ?4.0 100 Ankle - AH 9   10.6     Pop fossa AH 13.4  3.7  86.5  Pop fossa - Ankle 37 41 ?41 14.4  R Tibial - AH     Ankle AH 3.7 ?5.8 5.6 ?4.0 100 Ankle - AH 9   12.4     Pop fossa AH 12.7  4.9  88.4 Pop fossa - Ankle 37 41 ?41 14.0             SNC    Nerve / Sites Rec. Site Peak Lat Ref.  Amp Ref. Segments Distance    ms ms V V  cm  L Sural - Ankle (Calf)     Calf Ankle 3.9 ?4.4 9 ?6 Calf - Ankle 14  R Sural - Ankle (Calf)     Calf Ankle 4.1 ?4.4 12 ?6 Calf - Ankle 14  L Superficial peroneal - Ankle     Lat leg Ankle 4.2 ?4.4 7 ?6 Lat leg - Ankle 14  R Superficial peroneal - Ankle     Lat leg Ankle 4.1 ?4.4 8 ?6 Lat leg - Ankle 14             F  Wave    Nerve  F Lat Ref.   ms ms  L Tibial - AH 56.8 ?56.0  R Tibial - AH 56.3 ?56.0         EMG Summary Table    Spontaneous MUAP Recruitment  Muscle IA Fib PSW Fasc Other Amp Dur. Poly Pattern  R. Tibialis anterior Normal None None None _______ Normal Normal Normal Normal  R. Tibialis posterior Normal None None None _______ Normal Normal Normal Normal  R. Peroneus longus Normal None None None _______ Normal Normal Normal Normal  R. Gastrocnemius (Medial head) Normal None None None _______ Normal Normal Normal Normal  R. Vastus lateralis Normal None None None _______ Normal Normal Normal Normal  L. Tibialis anterior Normal None None None _______ Normal Normal Normal Normal  L. Tibialis posterior Normal None None None _______ Normal Normal Normal Normal  L. Peroneus longus Normal None None None _______ Normal Normal Normal Normal  L. Gastrocnemius (Medial head) Normal None None None _______ Normal Normal Normal Normal  L. Vastus lateralis Normal None None None _______ Normal Normal Normal Normal  R. Lumbar paraspinals (low) Normal None None None _______ Normal Normal Normal Normal  R. Lumbar paraspinals (mid) Normal None None None _______ Normal Normal Normal Normal  L. Lumbar paraspinals (low) Normal None None None _______ Normal Normal Normal Normal  L. Lumbar paraspinals (mid) Normal None  None None _______ Normal Normal Normal Normal

## 2020-08-25 NOTE — Progress Notes (Signed)
HISTORICAL  Thomas Michael 80 year old male, seen in request by his primary care physician Dr. [name], MD for evaluation of gait abnormality, frequent lower extremity pain,   I reviewed and summarized the referring note.  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  Today he came in because few years history of gradual worsening gait abnormality, he denies low back pain, but over the past few years, he has frequent knee pain, left worse than right, has been receiving intra-articular injection, which has been helpful, he denies bilateral lower extremity paresthesia, denies incontinence,  Now after weightbearing or walking for 10 minutes, he will develop knee pain, sometimes radiating pain to bilateral lower extremity, calf area, relieved by sitting down, raising up both legs, took diclofenac gel to his knees,   Laboratory evaluations in November 2021: Elevated A1c 7.6, LDL 45, creatinine 1.57  REVIEW OF SYSTEMS: Full 14 system review of systems performed and notable only for as above All other review of systems were negative.  ALLERGIES: No Known Allergies  HOME MEDICATIONS: Current Outpatient Medications  Medication Sig Dispense Refill  . aspirin 325 MG tablet Take 325 mg by mouth daily.      . Cholecalciferol (VITAMIN D PO) Take by mouth daily.    . clopidogrel (PLAVIX) 75 MG tablet Take 75 mg by mouth daily.     . Coenzyme Q10 (COQ10 PO) Take 1 tablet by mouth.     . gabapentin (NEURONTIN) 100 MG capsule Take 100 mg by mouth daily.     Marland Kitchen incobotulinumtoxinA (XEOMIN) 50 units SOLR injection USE AS DIRECTED, 50 UNITS EVERY 3 MONTHS. 1 each 3  . levothyroxine (SYNTHROID,  LEVOTHROID) 200 MCG tablet Take 200 mcg by mouth daily.      . meloxicam (MOBIC) 15 MG tablet Take 15 mg by mouth every other day.     . metoprolol (TOPROL-XL) 50 MG 24 hr tablet Take 50 mg by mouth daily.      . niacin 500 MG CR capsule Take 500 mg by mouth at bedtime.      . Omega-3 Fatty Acids (FISH OIL PO) Take 1 tablet by mouth daily.    . rosuvastatin (CRESTOR) 20 MG tablet Take 20 mg by mouth daily.      . sildenafil (VIAGRA) 100 MG tablet Take 100 mg by mouth as needed.      . traZODone (DESYREL) 100 MG tablet 200 mg at bedtime.     . valsartan-hydrochlorothiazide (DIOVAN-HCT) 160-12.5 MG per tablet     . VICTOZA 18 MG/3ML SOPN     . vitamin E 400 UNIT capsule Take 400 Units by mouth daily.       No current facility-administered medications for this visit.    PAST MEDICAL HISTORY: Past Medical History:  Diagnosis Date  . CAD (coronary artery disease)   . Facial nerve disorder, unspecified   . HLD (hyperlipidemia)   . HTN (hypertension)    Jan 2010 He had a 95% mid LAD lesion. There was 99% stenosis at the distal edge of the stented area in the right coronary artery. The EF was 65%. He had drug-eluting stents placed in both the LAD and the right coronary artery.  . Hypothyroidism   . Obesity   .  Other facial nerve disorders   . Other generalized ischemic cerebrovascular disease   . Syncope   . Unspecified ptosis of eyelid     PAST SURGICAL HISTORY: Past Surgical History:  Procedure Laterality Date  . CHOLECYSTECTOMY    . CHOLECYSTECTOMY    . TONSILLECTOMY    . TOTAL KNEE ARTHROPLASTY      FAMILY HISTORY: Family History  Problem Relation Age of Onset  . Heart disease Father   . Cancer Sister   . High blood pressure Sister   . Gallbladder disease Mother     SOCIAL HISTORY: Social History   Socioeconomic History  . Marital status: Married    Spouse name: Haynes Dage  . Number of children: 4  . Years of education: 1  . Highest education level: Not on file   Occupational History    Employer: [employer]    Comment: Works for himself  Tobacco Use  . Smoking status: Former Smoker    Packs/day: 2.00    Years: 12.00    Pack years: 24.00    Types: Cigarettes    Quit date: 07/19/1968    Years since quitting: 52.1  . Smokeless tobacco: Never Used  Substance and Sexual Activity  . Alcohol use: Yes    Alcohol/week: 2.0 standard drinks    Types: 2 Shots of liquor per week    Comment: rare  . Drug use: No  . Sexual activity: Not on file  Other Topics Concern  . Not on file  Social History Narrative   Patient is self employed - retired. Patient lives at home with his wife Haynes Dage.   Caffeine two cups daily.   Left handed.   College two years.   Social Determinants of Health   Financial Resource Strain: Not on file  Food Insecurity: Not on file  Transportation Needs: Not on file  Physical Activity: Not on file  Stress: Not on file  Social Connections: Not on file  Intimate Partner Violence: Not on file     PHYSICAL EXAM   There were no vitals filed for this visit. Not recorded     There is no height or weight on file to calculate BMI.  PHYSICAL EXAMNIATION:  Gen: NAD, conversant, well nourised, well groomed          NEUROLOGICAL EXAM: Morbid central obesity  MENTAL STATUS: Speech/cognition: Awake alert oriented to history taking and casual conversation CRANIAL NERVES: CN II: Visual fields are full to confrontation. Pupils are round equal and briskly reactive to light. CN III, IV, VI: extraocular movement are normal. No ptosis. CN V: Facial sensation is intact to light touch CN VII: Face is symmetric with normal eye closure  CN VIII: Hearing is normal to causal conversation. CN IX, X: Phonation is normal. CN XI: Head turning and shoulder shrug are intact  MOTOR: There is no pronator drift of out-stretched arms. Muscle bulk and tone are normal. Muscle strength is normal.  REFLEXES: Reflexes are 1 and  symmetric at the biceps, triceps, knees, and ankles. Plantar responses are flexor.  SENSORY: Intact to light touch, pinprick and vibratory sensation are intact in fingers and toes.  COORDINATION: There is no trunk or limb dysmetria noted.  GAIT/STANCE: Need to push-up to get up from seated position, wide-based, mildly unsteady, also limited by his big body habitus  DIAGNOSTIC DATA (LABS, IMAGING, TESTING) - I reviewed patient records, labs, notes, testing and imaging myself where available.   ASSESSMENT AND PLAN  Thomas Michael is a  80 y.o. male   Gait abnormality Intermittent bilateral lower extremity pain  His gait abnormality are multifactorial, limited by his big body habitus, knee pain,  EMG nerve conduction study today is normal, no evidence of peripheral neuropathy, myopathy, or bilateral lumbosacral radiculopathy  Referred him to physical therapy  He will continue his knee treatment with orthopedic clinic  Add on Cymbalta for symptomatic treatment, not a good candidate for NSAIDs due to abnormal kidney function  [name], M.D. Ph.D.  [facility] [address] [place], [place] [address] Ph: [phone] Fax: [phone]  CC:  [name], MD [place],  [place] [address]  [name], MD

## 2020-09-01 ENCOUNTER — Encounter: Payer: Self-pay | Admitting: Neurology

## 2020-09-01 ENCOUNTER — Other Ambulatory Visit: Payer: Self-pay

## 2020-09-01 ENCOUNTER — Ambulatory Visit (INDEPENDENT_AMBULATORY_CARE_PROVIDER_SITE_OTHER): Payer: Medicare Other | Admitting: Neurology

## 2020-09-01 VITALS — Ht 67.0 in | Wt 263.6 lb

## 2020-09-01 DIAGNOSIS — G5131 Clonic hemifacial spasm, right: Secondary | ICD-10-CM | POA: Diagnosis not present

## 2020-09-01 NOTE — Progress Notes (Signed)
Xeomin 50 units x 1 vial, NDC 3462-1947-12, Lot [id number], Exp 06/2021, specialty pharmacy

## 2020-09-01 NOTE — Progress Notes (Signed)
HPI: 80 year old left-handed male with history of hypertension, hypercholesterolemia, coronary artery disease, here for EMG guided BOTOX injection for right hemifacial spasm.   Since 2002, patient has had intermittent right eye and right facial twitching. This has gradually worsened over time. He was evaluated   in 2004 and diagnosed with hemifacial spasm. He had second opinion at [facility] with "extensive testing".  He tried and failed klonopin.  Ultimately he received botulinum toxin injection by Dr. Doy Mince in 2008, with excellent results. The benefit lasted for 4 months. Unfortunately his insurance expired and he was no longer able to receive this treatment.  Now patient is having persistent symptoms with significant impairment of his vision, ability to read, ability to use a computer, and ability to drive.  UPDATE September 01, 2020 He has been doing very well with every 3 months EMG guided Xeomin injection for right hemifacial spasm, we used Xeomin 50 units each time  Physical Exam   Cranial Nerves: He has occaional right lower eyelid twitching, mild static right ptosis, mild right eye closure weakness  Assessment and Plan: 80 year old left-handed male with right hemifacial spasm.  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  Total 50 units of Xeomin/1 cc of NS, used 35 units, discard 15 units  Right orbicularis oculi at  2, 5, ,6 7, 8, 9,  (2.5unit each)x 6 = 15 units Left orbicularis oculi at  3, 4  2.5 unitsx2 = 5 units Right corrugate 5 units Left corrugate 5 units Processors 5 units   Return to clinic in 3 months for repeat injections   [name], M.D. Ph.D.  [facility] [place], [place] [address] Phone: [phone] Fax:      [phone]

## 2020-09-05 DIAGNOSIS — E119 Type 2 diabetes mellitus without complications: Secondary | ICD-10-CM | POA: Diagnosis not present

## 2020-09-05 DIAGNOSIS — H5213 Myopia, bilateral: Secondary | ICD-10-CM | POA: Diagnosis not present

## 2020-09-05 DIAGNOSIS — H2513 Age-related nuclear cataract, bilateral: Secondary | ICD-10-CM | POA: Diagnosis not present

## 2020-10-01 DIAGNOSIS — M170 Bilateral primary osteoarthritis of knee: Secondary | ICD-10-CM | POA: Diagnosis not present

## 2020-10-01 DIAGNOSIS — M25562 Pain in left knee: Secondary | ICD-10-CM | POA: Diagnosis not present

## 2020-10-01 DIAGNOSIS — M1712 Unilateral primary osteoarthritis, left knee: Secondary | ICD-10-CM | POA: Diagnosis not present

## 2020-10-01 DIAGNOSIS — M25561 Pain in right knee: Secondary | ICD-10-CM | POA: Diagnosis not present

## 2020-10-02 DIAGNOSIS — M1711 Unilateral primary osteoarthritis, right knee: Secondary | ICD-10-CM | POA: Diagnosis not present

## 2020-10-02 DIAGNOSIS — M25561 Pain in right knee: Secondary | ICD-10-CM | POA: Diagnosis not present

## 2020-10-08 DIAGNOSIS — M1712 Unilateral primary osteoarthritis, left knee: Secondary | ICD-10-CM | POA: Diagnosis not present

## 2020-10-08 DIAGNOSIS — M25562 Pain in left knee: Secondary | ICD-10-CM | POA: Diagnosis not present

## 2020-10-09 DIAGNOSIS — M25561 Pain in right knee: Secondary | ICD-10-CM | POA: Diagnosis not present

## 2020-10-09 DIAGNOSIS — M1711 Unilateral primary osteoarthritis, right knee: Secondary | ICD-10-CM | POA: Diagnosis not present

## 2020-10-15 DIAGNOSIS — M1712 Unilateral primary osteoarthritis, left knee: Secondary | ICD-10-CM | POA: Diagnosis not present

## 2020-10-15 DIAGNOSIS — M25562 Pain in left knee: Secondary | ICD-10-CM | POA: Diagnosis not present

## 2020-10-16 DIAGNOSIS — M1711 Unilateral primary osteoarthritis, right knee: Secondary | ICD-10-CM | POA: Diagnosis not present

## 2020-10-16 DIAGNOSIS — M25561 Pain in right knee: Secondary | ICD-10-CM | POA: Diagnosis not present

## 2020-10-22 DIAGNOSIS — M25562 Pain in left knee: Secondary | ICD-10-CM | POA: Diagnosis not present

## 2020-10-22 DIAGNOSIS — M1712 Unilateral primary osteoarthritis, left knee: Secondary | ICD-10-CM | POA: Diagnosis not present

## 2020-10-23 DIAGNOSIS — M25561 Pain in right knee: Secondary | ICD-10-CM | POA: Diagnosis not present

## 2020-10-23 DIAGNOSIS — M1711 Unilateral primary osteoarthritis, right knee: Secondary | ICD-10-CM | POA: Diagnosis not present

## 2020-10-29 DIAGNOSIS — M1712 Unilateral primary osteoarthritis, left knee: Secondary | ICD-10-CM | POA: Diagnosis not present

## 2020-10-29 DIAGNOSIS — M25562 Pain in left knee: Secondary | ICD-10-CM | POA: Diagnosis not present

## 2020-10-30 DIAGNOSIS — M1711 Unilateral primary osteoarthritis, right knee: Secondary | ICD-10-CM | POA: Diagnosis not present

## 2020-10-30 DIAGNOSIS — M25561 Pain in right knee: Secondary | ICD-10-CM | POA: Diagnosis not present

## 2020-11-18 DIAGNOSIS — M1990 Unspecified osteoarthritis, unspecified site: Secondary | ICD-10-CM | POA: Diagnosis not present

## 2020-11-24 ENCOUNTER — Other Ambulatory Visit: Payer: Self-pay | Admitting: Neurology

## 2020-12-03 ENCOUNTER — Telehealth: Payer: Self-pay | Admitting: *Deleted

## 2020-12-03 ENCOUNTER — Ambulatory Visit: Payer: Medicare Other | Admitting: Neurology

## 2020-12-03 NOTE — Telephone Encounter (Signed)
No showed Xeomin appointment.

## 2021-01-23 DIAGNOSIS — M25561 Pain in right knee: Secondary | ICD-10-CM | POA: Diagnosis not present

## 2021-01-23 DIAGNOSIS — M25562 Pain in left knee: Secondary | ICD-10-CM | POA: Diagnosis not present

## 2021-02-12 DIAGNOSIS — M25561 Pain in right knee: Secondary | ICD-10-CM | POA: Diagnosis not present

## 2021-02-12 DIAGNOSIS — M25462 Effusion, left knee: Secondary | ICD-10-CM | POA: Diagnosis not present

## 2021-02-12 DIAGNOSIS — M25562 Pain in left knee: Secondary | ICD-10-CM | POA: Diagnosis not present

## 2021-02-12 DIAGNOSIS — M170 Bilateral primary osteoarthritis of knee: Secondary | ICD-10-CM | POA: Diagnosis not present

## 2021-02-12 DIAGNOSIS — M1712 Unilateral primary osteoarthritis, left knee: Secondary | ICD-10-CM | POA: Diagnosis not present

## 2021-02-18 DIAGNOSIS — M1711 Unilateral primary osteoarthritis, right knee: Secondary | ICD-10-CM | POA: Diagnosis not present

## 2021-02-18 DIAGNOSIS — M25561 Pain in right knee: Secondary | ICD-10-CM | POA: Diagnosis not present

## 2021-02-19 DIAGNOSIS — M25562 Pain in left knee: Secondary | ICD-10-CM | POA: Diagnosis not present

## 2021-02-19 DIAGNOSIS — M1712 Unilateral primary osteoarthritis, left knee: Secondary | ICD-10-CM | POA: Diagnosis not present

## 2021-02-25 DIAGNOSIS — M25561 Pain in right knee: Secondary | ICD-10-CM | POA: Diagnosis not present

## 2021-02-25 DIAGNOSIS — M1711 Unilateral primary osteoarthritis, right knee: Secondary | ICD-10-CM | POA: Diagnosis not present

## 2021-02-26 DIAGNOSIS — M25562 Pain in left knee: Secondary | ICD-10-CM | POA: Diagnosis not present

## 2021-02-26 DIAGNOSIS — M1712 Unilateral primary osteoarthritis, left knee: Secondary | ICD-10-CM | POA: Diagnosis not present

## 2021-03-04 ENCOUNTER — Ambulatory Visit: Payer: Medicare Other | Admitting: Neurology

## 2021-03-04 DIAGNOSIS — M25561 Pain in right knee: Secondary | ICD-10-CM | POA: Diagnosis not present

## 2021-03-04 DIAGNOSIS — M1711 Unilateral primary osteoarthritis, right knee: Secondary | ICD-10-CM | POA: Diagnosis not present

## 2021-04-03 DIAGNOSIS — Z23 Encounter for immunization: Secondary | ICD-10-CM | POA: Diagnosis not present

## 2021-05-05 NOTE — Telephone Encounter (Signed)
Spoke with patient, he was unaware that he missed his last injection in May. He states he has been doing so well and has not noticed any symptoms. Nothing seems to be bothering him at this time. He would like to see how long he can continue to go without an injection. He plans to call back if he starts noticing symptoms.

## 2021-05-07 DIAGNOSIS — Z23 Encounter for immunization: Secondary | ICD-10-CM | POA: Diagnosis not present

## 2021-05-19 DIAGNOSIS — I739 Peripheral vascular disease, unspecified: Secondary | ICD-10-CM | POA: Diagnosis not present

## 2021-05-21 ENCOUNTER — Other Ambulatory Visit: Payer: Self-pay | Admitting: Family Medicine

## 2021-05-21 DIAGNOSIS — I739 Peripheral vascular disease, unspecified: Secondary | ICD-10-CM

## 2021-05-27 ENCOUNTER — Ambulatory Visit
Admission: RE | Admit: 2021-05-27 | Discharge: 2021-05-27 | Disposition: A | Discharge status: Home / Self Care | Payer: Medicare Other | Source: Ambulatory Visit | Attending: Family Medicine | Admitting: Family Medicine

## 2021-05-27 DIAGNOSIS — I739 Peripheral vascular disease, unspecified: Secondary | ICD-10-CM

## 2021-05-27 DIAGNOSIS — E119 Type 2 diabetes mellitus without complications: Secondary | ICD-10-CM | POA: Diagnosis not present

## 2021-05-27 DIAGNOSIS — I10 Essential (primary) hypertension: Secondary | ICD-10-CM | POA: Diagnosis not present

## 2021-05-27 DIAGNOSIS — I70212 Atherosclerosis of native arteries of extremities with intermittent claudication, left leg: Secondary | ICD-10-CM | POA: Diagnosis not present

## 2021-06-02 DIAGNOSIS — M25552 Pain in left hip: Secondary | ICD-10-CM | POA: Diagnosis not present

## 2021-06-09 DIAGNOSIS — E039 Hypothyroidism, unspecified: Secondary | ICD-10-CM | POA: Diagnosis not present

## 2021-06-09 DIAGNOSIS — G4733 Obstructive sleep apnea (adult) (pediatric): Secondary | ICD-10-CM | POA: Diagnosis not present

## 2021-06-09 DIAGNOSIS — I739 Peripheral vascular disease, unspecified: Secondary | ICD-10-CM | POA: Diagnosis not present

## 2021-06-09 DIAGNOSIS — E559 Vitamin D deficiency, unspecified: Secondary | ICD-10-CM | POA: Diagnosis not present

## 2021-06-09 DIAGNOSIS — E1140 Type 2 diabetes mellitus with diabetic neuropathy, unspecified: Secondary | ICD-10-CM | POA: Diagnosis not present

## 2021-06-09 DIAGNOSIS — I129 Hypertensive chronic kidney disease with stage 1 through stage 4 chronic kidney disease, or unspecified chronic kidney disease: Secondary | ICD-10-CM | POA: Diagnosis not present

## 2021-06-09 DIAGNOSIS — N1831 Chronic kidney disease, stage 3a: Secondary | ICD-10-CM | POA: Diagnosis not present

## 2021-06-09 DIAGNOSIS — H6123 Impacted cerumen, bilateral: Secondary | ICD-10-CM | POA: Diagnosis not present

## 2021-06-09 DIAGNOSIS — Z0000 Encounter for general adult medical examination without abnormal findings: Secondary | ICD-10-CM | POA: Diagnosis not present

## 2021-06-09 DIAGNOSIS — E1122 Type 2 diabetes mellitus with diabetic chronic kidney disease: Secondary | ICD-10-CM | POA: Diagnosis not present

## 2021-06-09 DIAGNOSIS — I2510 Atherosclerotic heart disease of native coronary artery without angina pectoris: Secondary | ICD-10-CM | POA: Diagnosis not present

## 2021-06-23 ENCOUNTER — Other Ambulatory Visit: Payer: Self-pay | Admitting: Sports Medicine

## 2021-06-23 ENCOUNTER — Ambulatory Visit
Admission: RE | Admit: 2021-06-23 | Discharge: 2021-06-23 | Disposition: A | Discharge status: Home / Self Care | Payer: Medicare Other | Source: Ambulatory Visit | Attending: Sports Medicine | Admitting: Sports Medicine

## 2021-06-23 DIAGNOSIS — R52 Pain, unspecified: Secondary | ICD-10-CM

## 2021-06-23 DIAGNOSIS — M25552 Pain in left hip: Secondary | ICD-10-CM | POA: Diagnosis not present

## 2021-06-25 DIAGNOSIS — M170 Bilateral primary osteoarthritis of knee: Secondary | ICD-10-CM | POA: Diagnosis not present

## 2021-06-25 DIAGNOSIS — I10 Essential (primary) hypertension: Secondary | ICD-10-CM | POA: Diagnosis not present

## 2021-06-25 DIAGNOSIS — E119 Type 2 diabetes mellitus without complications: Secondary | ICD-10-CM | POA: Diagnosis not present

## 2021-07-01 DIAGNOSIS — M25552 Pain in left hip: Secondary | ICD-10-CM | POA: Diagnosis not present

## 2021-07-02 ENCOUNTER — Other Ambulatory Visit: Payer: Self-pay | Admitting: Sports Medicine

## 2021-07-02 DIAGNOSIS — M25552 Pain in left hip: Secondary | ICD-10-CM

## 2021-07-21 ENCOUNTER — Other Ambulatory Visit: Payer: Self-pay

## 2021-07-21 ENCOUNTER — Ambulatory Visit
Admission: RE | Admit: 2021-07-21 | Discharge: 2021-07-21 | Disposition: A | Discharge status: Home / Self Care | Payer: Medicare Other | Source: Ambulatory Visit | Attending: Sports Medicine | Admitting: Sports Medicine

## 2021-07-21 DIAGNOSIS — M25552 Pain in left hip: Secondary | ICD-10-CM | POA: Diagnosis not present

## 2021-07-21 DIAGNOSIS — D492 Neoplasm of unspecified behavior of bone, soft tissue, and skin: Secondary | ICD-10-CM | POA: Diagnosis not present

## 2021-07-21 DIAGNOSIS — Z85528 Personal history of other malignant neoplasm of kidney: Secondary | ICD-10-CM | POA: Diagnosis not present

## 2021-07-21 DIAGNOSIS — M79652 Pain in left thigh: Secondary | ICD-10-CM | POA: Diagnosis not present

## 2021-07-24 ENCOUNTER — Telehealth: Payer: Self-pay | Admitting: Oncology

## 2021-07-24 NOTE — Telephone Encounter (Signed)
[facility] Scheduling Phone Note  I contacted Thomas Michael regarding a referral from Dr. Eber Jones for purpose of evaluation and work up of bone lesion.  Thomas Michael was aware of his referral and reason.  Information on reason for referral was not necessary.  Patient was informed about the [facility] and the intent being to complete a diagnostic/prognostic work-up for his suspicious findings.  He is aware his appointment is with our Physician Assistant to identify a diagnostic plan of care and to arrange further oncologist or specialist care as indicated.  I confirmed with Thomas Michael he  has transportation to his [facility] appointment.  Patient is aware to bring a list of medications, as well as insurance cards.  Visitor policy and COVID protocol reviewed with patient as well, including what to expect on arrival to the [facility] regarding check-in process, visit, and potential for labs afterwards.  We look forward to meeting Thomas Michael and completing his diagnostic work-up and arranging his subsequent appointment with our oncologist team.  Diagnostic Clinic Specific Info:  Best contact/way to contact patient:  Primary contact info in [place] updated to reflect?:  not applicable Is there a HC POA?:  No  Location of Diagnostic Clinic Appointment and Contact Info Provided to Patient: [place] [facility] [place], [place] [address] [phone]   Reason for Referral, Clinical Information:  07/21/2021 MR Left Hip  IMPRESSION: 1. 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 Evidence of cortical breakthrough and soft tissue tumor outside the bone. This would be highly suspicious for a metastatic lesion given history of renal cell carcinoma. PET-CT may be helpful for further evaluation. 2.  Edema in the iliopsoas tendon near its attachment on the lesser trochanter which could be tendinopathy or tumor involvement but no rupture.   2012 Renal Cell diagnosis - Dr. Alinda Money.  Scanned document pg 17 in Media

## 2021-07-28 ENCOUNTER — Inpatient Hospital Stay: Payer: Medicare Other

## 2021-07-28 ENCOUNTER — Inpatient Hospital Stay: Payer: Medicare Other | Attending: Physician Assistant | Admitting: Physician Assistant

## 2021-07-28 ENCOUNTER — Encounter: Payer: Self-pay | Admitting: Physician Assistant

## 2021-07-28 ENCOUNTER — Other Ambulatory Visit: Payer: Self-pay

## 2021-07-28 VITALS — BP 135/78 | HR 76 | Temp 98.1°F | Resp 18 | Wt 247.0 lb

## 2021-07-28 DIAGNOSIS — Z791 Long term (current) use of non-steroidal anti-inflammatories (NSAID): Secondary | ICD-10-CM | POA: Diagnosis not present

## 2021-07-28 DIAGNOSIS — I10 Essential (primary) hypertension: Secondary | ICD-10-CM | POA: Diagnosis not present

## 2021-07-28 DIAGNOSIS — R634 Abnormal weight loss: Secondary | ICD-10-CM | POA: Insufficient documentation

## 2021-07-28 DIAGNOSIS — R9389 Abnormal findings on diagnostic imaging of other specified body structures: Secondary | ICD-10-CM | POA: Insufficient documentation

## 2021-07-28 DIAGNOSIS — R937 Abnormal findings on diagnostic imaging of other parts of musculoskeletal system: Secondary | ICD-10-CM | POA: Diagnosis not present

## 2021-07-28 DIAGNOSIS — M899 Disorder of bone, unspecified: Secondary | ICD-10-CM

## 2021-07-28 DIAGNOSIS — I2510 Atherosclerotic heart disease of native coronary artery without angina pectoris: Secondary | ICD-10-CM | POA: Diagnosis not present

## 2021-07-28 DIAGNOSIS — Z905 Acquired absence of kidney: Secondary | ICD-10-CM | POA: Diagnosis not present

## 2021-07-28 DIAGNOSIS — Z85528 Personal history of other malignant neoplasm of kidney: Secondary | ICD-10-CM | POA: Diagnosis not present

## 2021-07-28 DIAGNOSIS — Z79891 Long term (current) use of opiate analgesic: Secondary | ICD-10-CM | POA: Insufficient documentation

## 2021-07-28 DIAGNOSIS — Z7982 Long term (current) use of aspirin: Secondary | ICD-10-CM | POA: Insufficient documentation

## 2021-07-28 DIAGNOSIS — E669 Obesity, unspecified: Secondary | ICD-10-CM | POA: Diagnosis not present

## 2021-07-28 DIAGNOSIS — E039 Hypothyroidism, unspecified: Secondary | ICD-10-CM | POA: Diagnosis not present

## 2021-07-28 DIAGNOSIS — Z79899 Other long term (current) drug therapy: Secondary | ICD-10-CM | POA: Insufficient documentation

## 2021-07-28 DIAGNOSIS — K5900 Constipation, unspecified: Secondary | ICD-10-CM | POA: Insufficient documentation

## 2021-07-28 DIAGNOSIS — Z87891 Personal history of nicotine dependence: Secondary | ICD-10-CM | POA: Diagnosis not present

## 2021-07-28 DIAGNOSIS — E785 Hyperlipidemia, unspecified: Secondary | ICD-10-CM | POA: Diagnosis not present

## 2021-07-28 DIAGNOSIS — Z125 Encounter for screening for malignant neoplasm of prostate: Secondary | ICD-10-CM | POA: Diagnosis not present

## 2021-07-28 DIAGNOSIS — G893 Neoplasm related pain (acute) (chronic): Secondary | ICD-10-CM | POA: Insufficient documentation

## 2021-07-28 LAB — CMP (CANCER CENTER ONLY)
ALT: 21 U/L (ref 0–44)
AST: 14 U/L — ABNORMAL LOW (ref 15–41)
Albumin: 4.5 g/dL (ref 3.5–5.0)
Alkaline Phosphatase: 55 U/L (ref 38–126)
Anion gap: 9 (ref 5–15)
BUN: 18 mg/dL (ref 8–23)
CO2: 25 mmol/L (ref 22–32)
Calcium: 9.8 mg/dL (ref 8.9–10.3)
Chloride: 102 mmol/L (ref 98–111)
Creatinine: 1.34 mg/dL — ABNORMAL HIGH (ref 0.61–1.24)
GFR, Estimated: 54 mL/min — ABNORMAL LOW (ref >60)
Glucose, Bld: 108 mg/dL — ABNORMAL HIGH (ref 70–99)
Potassium: 3.9 mmol/L (ref 3.5–5.1)
Sodium: 136 mmol/L (ref 135–145)
Total Bilirubin: 0.5 mg/dL (ref 0.3–1.2)
Total Protein: 8.1 g/dL (ref 6.5–8.1)

## 2021-07-28 LAB — CBC WITH DIFFERENTIAL (CANCER CENTER ONLY)
Abs Immature Granulocytes: 0.03 10*3/uL (ref 0.00–0.07)
Basophils Absolute: 0.1 10*3/uL (ref 0.0–0.1)
Basophils Relative: 1 %
Eosinophils Absolute: 0.2 10*3/uL (ref 0.0–0.5)
Eosinophils Relative: 2 %
HCT: 40.5 % (ref 39.0–52.0)
Hemoglobin: 13.2 g/dL (ref 13.0–17.0)
Immature Granulocytes: 0 %
Lymphocytes Relative: 21 %
Lymphs Abs: 1.8 10*3/uL (ref 0.7–4.0)
MCH: 29.2 pg (ref 26.0–34.0)
MCHC: 32.6 g/dL (ref 30.0–36.0)
MCV: 89.6 fL (ref 80.0–100.0)
Monocytes Absolute: 0.8 10*3/uL (ref 0.1–1.0)
Monocytes Relative: 10 %
Neutro Abs: 5.8 10*3/uL (ref 1.7–7.7)
Neutrophils Relative %: 66 %
Platelet Count: 264 10*3/uL (ref 150–400)
RBC: 4.52 MIL/uL (ref 4.22–5.81)
RDW: 13.2 % (ref 11.5–15.5)
WBC Count: 8.7 10*3/uL (ref 4.0–10.5)
nRBC: 0 % (ref 0.0–0.2)

## 2021-07-28 LAB — LACTATE DEHYDROGENASE: LDH: 122 U/L (ref 98–192)

## 2021-07-28 NOTE — Progress Notes (Signed)
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  On review of the previous records, Thomas Michael initially presented with 62-monthhistory of left thigh pain.  He was evaluated by Dr. KInez Catalinain sport medicine for further work-up.  Patient underwent imaging with MRI of the left hip on 07/21/2021.  Endings revealed 4 cm destructive bone lesion involving the intertrochanteric region of the left hip.  There is evidence of cortical breakthrough and soft tissue tumor outside the bone.  Additionally there is edema in the iliopsoas tendon near its attachment on the lesser trochanter which could be tendinopathy or tumor involvement.  On exam today, Thomas Michael that he continues to have pain involving the left thigh that has been present since October 2022.  He rates the pain as 8 out of 10 and it is triggered with muscle contraction and ambulation.  His ambulation has been affected due to the pain and he now requires a walker to assist with walking.  His pain is managed with Tylenol every 4-6 hours and hydrocodone once a day.  He denies any radiculopathy or neuropathy involving the left lower extremity.  Patient reports his energy levels are stable where he still works from home.  He has good appetite but has lost approximately 25 pounds in the past year.  He contributes his weight loss to portion control and eating less.  He denies any nausea,  vomiting or abdominal pain.  He does have constipation that is secondary to taking hydrocodone.  He has a bowel movement every 2 to 3 days and takes stool softeners as needed.  Patient reports occasional episodes of hematochezia secondary to hemorrhoids, approximately once a month.  Patient denies fevers, chills, night sweats, shortness of breath, chest pain, cough, urinary symptoms, dizziness, headaches or syncopal episodes.  He has no other complaints.  Remaining 10 point ROS is below.  MEDICAL HISTORY:  Past Medical History:  Diagnosis Date   CAD (coronary artery disease)    Facial nerve disorder, unspecified    HLD (hyperlipidemia)    HTN (hypertension)    Jan 2010 He had a 95% mid LAD lesion. There was 99% stenosis at the distal edge of the stented area in the right coronary artery. The EF was 65%. He had drug-eluting stents placed in both the LAD and the right coronary artery.   Hypothyroidism    Obesity    Other facial nerve disorders    Renal cell carcinoma of right kidney (HCC)    Syncope    Unspecified ptosis of eyelid     SURGICAL HISTORY: Past Surgical History:  Procedure Laterality Date   CHOLECYSTECTOMY     CHOLECYSTECTOMY     CORONARY STENT PLACEMENT     NEPHRECTOMY RADICAL Right 2012   TONSILLECTOMY      SOCIAL HISTORY: Social History   Socioeconomic History   Marital status: Married    Spouse name: MHaynes Dage  Number of children: 4   Years of education: 14   Highest education level: Not on file  Occupational  History   Occupation: part time    Employer: [employer]    Comment: Works for himself  Tobacco Use   Smoking status: Former    Packs/day: 2.00    Years: 12.00    Pack years: 24.00    Types: Cigarettes    Quit date: 07/19/1968    Years since quitting: 53.0   Smokeless tobacco: Never  Substance and Sexual Activity   Alcohol use: Yes    Alcohol/week: 2.0 standard drinks    Types: 2 Shots of liquor per week    Comment: rare   Drug use: No    Sexual activity: Not on file  Other Topics Concern   Not on file  Social History Narrative   Patient is self employed - retired. Patient lives at home with his wife Haynes Dage.   Caffeine two cups daily.   Left handed.   College two years.   Social Determinants of Health   Financial Resource Strain: Not on file  Food Insecurity: Not on file  Transportation Needs: Not on file  Physical Activity: Not on file  Stress: Not on file  Social Connections: Not on file  Intimate Partner Violence: Not on file    FAMILY HISTORY: Family History  Problem Relation Age of Onset   Gallbladder disease Mother    Heart disease Father    Breast cancer Sister 8   High blood pressure Sister     ALLERGIES:  is allergic to promethazine-codeine.  MEDICATIONS:  Current Outpatient Medications  Medication Sig Dispense Refill   Accu-Chek FastClix Lancets MISC check CBG     acetaminophen (TYLENOL) 325 MG tablet 3 tablet as needed     aspirin 81 MG EC tablet Take 81 mg by mouth daily.     Blood Glucose Monitoring Suppl (ACCU-CHEK AVIVA PLUS) w/Device KIT check CBG     Brompheniramine-Pseudoeph (DIMETAPP PO) Take by mouth. As needed for nasal congestion     Cholecalciferol 50 MCG (2000 UT) TABS 1 capsule     clopidogrel (PLAVIX) 75 MG tablet Take 75 mg by mouth daily.     Docusate Sodium (COLACE PO) Take by mouth as needed.     DULoxetine (CYMBALTA) 30 MG capsule TAKE 1 CAPSULE BY MOUTH EVERY DAY 90 capsule 0   gabapentin (NEURONTIN) 100 MG capsule Take 100 mg by mouth daily.      HYDROcodone-acetaminophen (NORCO/VICODIN) 5-325 MG tablet Take 1 tablet by mouth every 6 (six) hours as needed.     levothyroxine (SYNTHROID, LEVOTHROID) 200 MCG tablet Take 200 mcg by mouth daily.     metoprolol (TOPROL-XL) 50 MG 24 hr tablet Take 50 mg by mouth daily.     Multiple Vitamin (MULTIVITAMINS PO) 1 tablet     niacin 500 MG CR capsule Take 500 mg by mouth at bedtime.     Omega-3 Fatty Acids (FISH OIL PO) Take 1  tablet by mouth daily.     rOPINIRole (REQUIP) 0.25 MG tablet TAKE 2 TABLETS ONCE DAILY 1TO 3 HOURS BEFORE BEDTIME     rosuvastatin (CRESTOR) 20 MG tablet Take 20 mg by mouth daily.     sildenafil (VIAGRA) 100 MG tablet Take 100 mg by mouth as needed.     traZODone (DESYREL) 100 MG tablet 200 mg at bedtime.      valsartan-hydrochlorothiazide (DIOVAN-HCT) 160-12.5 MG per tablet      VICTOZA 18 MG/3ML SOPN      vitamin E 400 UNIT capsule Take 400 Units by mouth daily.  Coenzyme Q10 (COQ10 PO) Take 1 tablet by mouth.  (Patient not taking: Reported on 07/28/2021)     incobotulinumtoxinA (XEOMIN) 50 units SOLR injection USE AS DIRECTED, 50 UNITS EVERY 3 MONTHS. (Patient not taking: Reported on 07/28/2021) 1 each 3   meloxicam (MOBIC) 15 MG tablet Take 15 mg by mouth every other day.  (Patient not taking: Reported on 07/28/2021)     No current facility-administered medications for this visit.    REVIEW OF SYSTEMS:   Constitutional: ( - ) fevers, ( - )  chills , ( - ) night sweats Eyes: ( - ) blurriness of vision, ( - ) double vision, ( - ) watery eyes Ears, nose, mouth, throat, and face: ( - ) mucositis, ( - ) sore throat Respiratory: ( - ) cough, ( - ) dyspnea, ( - ) wheezes Cardiovascular: ( - ) palpitation, ( - ) chest discomfort, ( - ) lower extremity swelling Gastrointestinal:  ( - ) nausea, ( - ) heartburn, ( +) change in bowel habits Skin: ( - ) abnormal skin rashes Lymphatics: ( - ) new lymphadenopathy, ( - ) easy bruising Neurological: ( - ) numbness, ( - ) tingling, ( - ) new weaknesses Behavioral/Psych: ( - ) mood change, ( - ) new changes  All other systems were reviewed with the patient and are negative.  PHYSICAL EXAMINATION: ECOG PERFORMANCE STATUS: 1 - Symptomatic but completely ambulatory  Vitals:   07/28/21 1330  BP: 135/78  Pulse: 76  Resp: 18  Temp: 98.1 F (36.7 C)  SpO2: 97%   Filed Weights   07/28/21 1330  Weight: 247 lb (112 kg)    GENERAL: well  appearing male in NAD  SKIN: skin color, texture, turgor are normal, no rashes or significant lesions EYES: conjunctiva are pink and non-injected, sclera clear OROPHARYNX: no exudate, no erythema; lips, buccal mucosa, and tongue normal  NECK: supple, non-tender LYMPH:  no palpable lymphadenopathy in the cervical or supraclavicular lymph nodes.  LUNGS: clear to auscultation and percussion with normal breathing effort HEART: regular rate & rhythm and no murmurs and no lower extremity edema ABDOMEN: soft, non-tender, non-distended, normal bowel sounds Musculoskeletal: no cyanosis of digits and no clubbing  PSYCH: alert & oriented x 3, fluent speech NEURO: no focal motor/sensory deficits  LABORATORY DATA:  I have reviewed the data as listed CBC Latest Ref Rng & Units 07/28/2021 12/11/2010 12/10/2010  WBC 4.0 - 10.5 K/uL 8.7 - -  Hemoglobin 13.0 - 17.0 g/dL 13.2 13.7 14.3  Hematocrit 39.0 - 52.0 % 40.5 42.7 44.0  Platelets 150 - 400 K/uL 264 - -    CMP Latest Ref Rng & Units 12/11/2010 12/10/2010 12/04/2010  Glucose 70 - 99 mg/dL 124(H) 154(H) 77  BUN 6 - 23 mg/dL 17 17 21   Creatinine 0.4 - 1.5 mg/dL 1.38 1.13 0.92  Sodium 135 - 145 mEq/L 132(L) 135 138  Potassium 3.5 - 5.1 mEq/L 4.5 HEMOLYSIS AT THIS LEVEL MAY AFFECT RESULT 4.0 4.0  Chloride 96 - 112 mEq/L 97 99 103  CO2 19 - 32 mEq/L 24 24 25   Calcium 8.4 - 10.5 mg/dL 8.5 8.7 9.7  Total Protein 6.0 - 8.3 g/dL - - -  Total Bilirubin 0.3 - 1.2 mg/dL - - -  Alkaline Phos 39 - 117 U/L - - -  AST 0 - 37 U/L - - -  ALT 0 - 53 U/L - - -     RADIOGRAPHIC STUDIES: I have personally reviewed the radiological  images as listed and agreed with the findings in the report. MR HIP LEFT WO CONTRAST  Result Date: 07/22/2021 CLINICAL DATA:  Left hip and thigh pain for 2 months. Prior history of renal cell carcinoma. EXAM: MR OF THE LEFT HIP WITHOUT CONTRAST TECHNIQUE: Multiplanar, multisequence MR imaging was performed. No intravenous contrast was  administered. COMPARISON:  Radiographs 06/23/2021 FINDINGS: There is a 4 cm lytic destructive bone lesion involving the intertrochanteric region of the left hip most notably involving the lesser trochanter. Evidence of cortical breakthrough and soft tissue tumor outside the bone anteriorly and medially. There is also signal abnormality in the iliopsoas tendon near its attachment on the lesser trochanter which could be tendinopathy or tumor involvement but no rupture at this point. I do not see any other bone lesions involving the bony pelvis or hips. The pubic symphysis and SI joints are intact. No evidence of muscle tear or muscle lesions. No significant intrapelvic abnormalities are identified. IMPRESSION: 1. 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 Evidence of cortical breakthrough and soft tissue tumor outside the bone. This would be highly suspicious for a metastatic lesion given history of renal cell carcinoma. PET-CT may be helpful for further evaluation. 2. Edema in the iliopsoas tendon near its attachment on the lesser trochanter which could be tendinopathy or tumor involvement but no rupture. 3. No other bone lesions are identified. Electronically Signed   By: Marijo Sanes M.D.   On: 07/22/2021 08:41    ASSESSMENT & PLAN Thomas Michael is a 81 y.o. male who presents to the diagnostic clinic for initial evaluation for abnormal MRI of the left hip.  I reviewed imaging results in detail that show a 4 cm destructive bone lesion involving the intertrochanteric region of the left hip with cortical breakthrough and soft tissue tumor outside the bone. Patient has history of renal cell carcinoma of the right kidney and underwent radical nephrectomy in 2012. Since his diagnosis of RCC was over 10 years ago and it was early stage (pT1b), we will have to consider other etiologies.  Patient will proceed with serologic  work-up today, additionally, we will order a CT scan of the chest, abdomen and pelvis to evaluate for others of metastatic disease that might be amenable for biopsy.  If today's blood work is nondiagnostic and there is no other targetable lesion, we will pursue a bone biopsy.  #Destructive bone lesion involving the intertrochanteric region of the left hip  -Seen on MRI left hip on 07/21/2021 -Labs today to check CBC, CMP, LDH, SPEP with IFE, sFLC and PSA levels.  -Request STAT CT scan of the chest, abdomen and pelvis to evaluate for other sites of metastatic disease -Pain is controlled with tylenol and hydrocodone at this time. Monitor for now.  -Consider biopsy based on above workup.   #Age appropriate cancer screenings: --Check PSA levels today --Patient undergoes colon cancer screening with Cologuard test, most recent last year per patient. Has not undergone colonoscopy.   Orders Placed This Encounter  Procedures   CBC with Differential ([place] Only)    Standing Status:   Future    Number of Occurrences:   1    Standing Expiration Date:   07/28/2022   CMP ([place] only)    Standing Status:   Future    Number of Occurrences:   1    Standing Expiration Date:   07/28/2022   Lactate dehydrogenase (  LDH)    Standing Status:   Future    Number of Occurrences:   1    Standing Expiration Date:   07/28/2022   Prostate-Specific AG, Serum    Standing Status:   Future    Number of Occurrences:   1    Standing Expiration Date:   07/28/2022   Multiple Myeloma Panel (SPEP&IFE w/QIG)    Standing Status:   Future    Number of Occurrences:   1    Standing Expiration Date:   07/28/2022   Kappa/lambda light chains    Standing Status:   Future    Number of Occurrences:   1    Standing Expiration Date:   07/28/2022    All questions were answered. The patient knows to call the clinic with any problems, questions or concerns.  I have spent a total of 60 minutes minutes of face-to-face and  non-face-to-face time, preparing to see the patient, obtaining and/or reviewing separately obtained history, performing a medically appropriate examination, counseling and educating the patient, ordering tests, documenting clinical information in the electronic health record, and care coordination.   [name], PA-C Department of Hematology/Oncology [place] at [facility] Phone: [phone]  Patient was seen with Dr. Lorenso Courier  I have read the above note and personally examined the patient. I agree with the assessment and plan as noted above.  Briefly Thomas Michael is a 81 year old male who presents for evaluation of a 4 cm destructive bone lesion in the left hip. Findings at this time are concerning for metastatic disease. Recommend full CT C/A/P to assess for other lesions/primary. Once this is complete we will focus on an area to biopsy. Tumor markers today for prostate cancer and MM. Return to clinic pending results of biopsy.    Ledell Peoples, MD Department of Hematology/Oncology [place] at [facility] Phone: [phone] Pager: [phone] Email: [email or web address]

## 2021-07-29 ENCOUNTER — Other Ambulatory Visit: Payer: Self-pay | Admitting: Physician Assistant

## 2021-07-29 ENCOUNTER — Encounter (HOSPITAL_COMMUNITY): Payer: Self-pay

## 2021-07-29 ENCOUNTER — Encounter (HOSPITAL_COMMUNITY): Payer: Self-pay | Admitting: Radiology

## 2021-07-29 ENCOUNTER — Ambulatory Visit (HOSPITAL_COMMUNITY)
Admission: RE | Admit: 2021-07-29 | Discharge: 2021-07-29 | Disposition: A | Discharge status: Home / Self Care | Payer: Medicare Other | Source: Ambulatory Visit | Attending: Physician Assistant | Admitting: Physician Assistant

## 2021-07-29 DIAGNOSIS — R918 Other nonspecific abnormal finding of lung field: Secondary | ICD-10-CM | POA: Diagnosis not present

## 2021-07-29 DIAGNOSIS — C799 Secondary malignant neoplasm of unspecified site: Secondary | ICD-10-CM | POA: Diagnosis not present

## 2021-07-29 DIAGNOSIS — R911 Solitary pulmonary nodule: Secondary | ICD-10-CM | POA: Diagnosis not present

## 2021-07-29 DIAGNOSIS — M899 Disorder of bone, unspecified: Secondary | ICD-10-CM | POA: Diagnosis not present

## 2021-07-29 DIAGNOSIS — R9389 Abnormal findings on diagnostic imaging of other specified body structures: Secondary | ICD-10-CM | POA: Diagnosis not present

## 2021-07-29 DIAGNOSIS — K7689 Other specified diseases of liver: Secondary | ICD-10-CM | POA: Diagnosis not present

## 2021-07-29 DIAGNOSIS — K5730 Diverticulosis of large intestine without perforation or abscess without bleeding: Secondary | ICD-10-CM | POA: Diagnosis not present

## 2021-07-29 DIAGNOSIS — Z85528 Personal history of other malignant neoplasm of kidney: Secondary | ICD-10-CM | POA: Diagnosis not present

## 2021-07-29 LAB — PROSTATE-SPECIFIC AG, SERUM (LABCORP): Prostate Specific Ag, Serum: 0.4 ng/mL (ref 0.0–4.0)

## 2021-07-29 LAB — KAPPA/LAMBDA LIGHT CHAINS
Kappa free light chain: 32.7 mg/L — ABNORMAL HIGH (ref 3.3–19.4)
Kappa, lambda light chain ratio: 1.71 — ABNORMAL HIGH (ref 0.26–1.65)
Lambda free light chains: 19.1 mg/L (ref 5.7–26.3)

## 2021-07-29 MED ORDER — IOHEXOL 350 MG/ML SOLN
80.0000 mL | Freq: Once | INTRAVENOUS | Status: AC | PRN
  Administered 2021-07-29: 80 mL via INTRAVENOUS

## 2021-07-29 MED ORDER — IOHEXOL 9 MG/ML PO SOLN
1000.0000 mL | ORAL | Status: AC
  Administered 2021-07-29: 1000 mL via ORAL

## 2021-07-29 MED ORDER — SODIUM CHLORIDE (PF) 0.9 % IJ SOLN
INTRAMUSCULAR | Status: AC
  Filled 2021-07-29: qty 50

## 2021-07-29 MED ORDER — IOHEXOL 9 MG/ML PO SOLN
ORAL | Status: AC
  Filled 2021-07-29: qty 1000

## 2021-07-29 NOTE — Progress Notes (Signed)
Patient Name  Thomas, Michael Legal Sex  Male DOB  [birth year] SSN  [id number] Address  [place] [address] Phone  [phone] [facility])  [phone] (Work)  [phone] (Mobile) *Preferred*    RE: CT Biopsy Received: Today Anderson Malta D Yes okay to hold Plavix.        Previous Messages   ----- Message -----  From: Garth Bigness D  Sent: 07/29/2021   3:53 PM EST  To: Lincoln Brigham, PA-C  Subject: FW: CT Biopsy                                   Hi this patient is on plavix and will need to stop for 5 days prior to biopsy once approved. Please advise if okay to hold. Thanks Aniceto Boss  ----- Message -----  From: Garth Bigness D  Sent: 07/29/2021   3:51 PM EST  To: Ir Procedure Requests  Subject: CT Biopsy                                       Procedure: CT Biopsy   Reason:  Metastatic malignant neoplasm, unspecified site, CT scan shows metastatic disease. Need biopsy of mesenteric mass   History:  CT in computer   Provider:  [name] T   Provider Contact:  [phone]

## 2021-07-29 NOTE — Progress Notes (Signed)
Patient Name  Thomas Michael, Thomas Michael Legal Sex  Male DOB  1941/06/07 SSN  [id number] Address  [place] [address] Phone  [phone] [facility])  [phone] (Work)  [phone] (Mobile) *Preferred*    RE: CT Biopsy Received: Today Corrie Mckusick, DO  Garth Bigness D OK for CT guided biopsy, left chest wall.   Suggest using CT, [place] in the room, for left posterior chest wall nodule.     Earleen Newport        Previous Messages   ----- Message -----  From: Garth Bigness D  Sent: 07/29/2021   3:51 PM EST  To: Ir Procedure Requests  Subject: CT Biopsy                                       Procedure: CT Biopsy   Reason:  Metastatic malignant neoplasm, unspecified site, CT scan shows metastatic disease. Need biopsy of mesenteric mass   History:  CT in computer   Provider:  [name] T   Provider Contact:  [phone]

## 2021-07-29 NOTE — Progress Notes (Signed)
I called Thomas Michael and review CT imaging from today that shows multifocal lesions concerning for metastatic disease. We will requested a STAT biopsy to confirm diagnosis. Patient expressed understanding of the plan provided.

## 2021-08-03 LAB — MULTIPLE MYELOMA PANEL, SERUM
Albumin SerPl Elph-Mcnc: 3.7 g/dL (ref 2.9–4.4)
Albumin/Glob SerPl: 1.1 (ref 0.7–1.7)
Alpha 1: 0.3 g/dL (ref 0.0–0.4)
Alpha2 Glob SerPl Elph-Mcnc: 1.1 g/dL — ABNORMAL HIGH (ref 0.4–1.0)
B-Globulin SerPl Elph-Mcnc: 1.2 g/dL (ref 0.7–1.3)
Gamma Glob SerPl Elph-Mcnc: 1.1 g/dL (ref 0.4–1.8)
Globulin, Total: 3.6 g/dL (ref 2.2–3.9)
IgA: 315 mg/dL (ref 61–437)
IgG (Immunoglobin G), Serum: 1085 mg/dL (ref 603–1613)
IgM (Immunoglobulin M), Srm: 52 mg/dL (ref 15–143)
Total Protein ELP: 7.3 g/dL (ref 6.0–8.5)

## 2021-08-06 ENCOUNTER — Other Ambulatory Visit: Payer: Self-pay | Admitting: Radiology

## 2021-08-07 ENCOUNTER — Ambulatory Visit (HOSPITAL_COMMUNITY)
Admission: RE | Admit: 2021-08-07 | Discharge: 2021-08-07 | Disposition: A | Discharge status: Home / Self Care | Payer: Medicare Other | Source: Ambulatory Visit | Attending: Physician Assistant | Admitting: Physician Assistant

## 2021-08-07 ENCOUNTER — Other Ambulatory Visit: Payer: Self-pay

## 2021-08-07 ENCOUNTER — Other Ambulatory Visit: Payer: Self-pay | Admitting: Physician Assistant

## 2021-08-07 ENCOUNTER — Encounter (HOSPITAL_COMMUNITY): Payer: Self-pay

## 2021-08-07 DIAGNOSIS — C799 Secondary malignant neoplasm of unspecified site: Secondary | ICD-10-CM

## 2021-08-07 DIAGNOSIS — Z905 Acquired absence of kidney: Secondary | ICD-10-CM | POA: Diagnosis not present

## 2021-08-07 DIAGNOSIS — Z87891 Personal history of nicotine dependence: Secondary | ICD-10-CM | POA: Insufficient documentation

## 2021-08-07 DIAGNOSIS — I10 Essential (primary) hypertension: Secondary | ICD-10-CM | POA: Diagnosis not present

## 2021-08-07 DIAGNOSIS — Z79899 Other long term (current) drug therapy: Secondary | ICD-10-CM | POA: Diagnosis not present

## 2021-08-07 DIAGNOSIS — Z85528 Personal history of other malignant neoplasm of kidney: Secondary | ICD-10-CM | POA: Diagnosis not present

## 2021-08-07 DIAGNOSIS — R2232 Localized swelling, mass and lump, left upper limb: Secondary | ICD-10-CM | POA: Diagnosis not present

## 2021-08-07 DIAGNOSIS — C7989 Secondary malignant neoplasm of other specified sites: Secondary | ICD-10-CM | POA: Insufficient documentation

## 2021-08-07 DIAGNOSIS — I2510 Atherosclerotic heart disease of native coronary artery without angina pectoris: Secondary | ICD-10-CM | POA: Insufficient documentation

## 2021-08-07 DIAGNOSIS — E039 Hypothyroidism, unspecified: Secondary | ICD-10-CM | POA: Insufficient documentation

## 2021-08-07 DIAGNOSIS — E785 Hyperlipidemia, unspecified: Secondary | ICD-10-CM | POA: Diagnosis not present

## 2021-08-07 LAB — CBC
HCT: 41.5 % (ref 39.0–52.0)
Hemoglobin: 13 g/dL (ref 13.0–17.0)
MCH: 28.4 pg (ref 26.0–34.0)
MCHC: 31.3 g/dL (ref 30.0–36.0)
MCV: 90.8 fL (ref 80.0–100.0)
Platelets: 240 10*3/uL (ref 150–400)
RBC: 4.57 MIL/uL (ref 4.22–5.81)
RDW: 13.1 % (ref 11.5–15.5)
WBC: 8.2 10*3/uL (ref 4.0–10.5)
nRBC: 0 % (ref 0.0–0.2)

## 2021-08-07 LAB — PROTIME-INR
INR: 1 (ref 0.8–1.2)
Prothrombin Time: 13.6 seconds (ref 11.4–15.2)

## 2021-08-07 LAB — GLUCOSE, CAPILLARY: Glucose-Capillary: 122 mg/dL — ABNORMAL HIGH (ref 70–99)

## 2021-08-07 MED ORDER — MIDAZOLAM HCL 2 MG/2ML IJ SOLN
INTRAMUSCULAR | Status: AC
  Filled 2021-08-07: qty 2

## 2021-08-07 MED ORDER — FENTANYL CITRATE (PF) 100 MCG/2ML IJ SOLN
INTRAMUSCULAR | Status: AC
  Filled 2021-08-07: qty 2

## 2021-08-07 MED ORDER — LIDOCAINE HCL (PF) 1 % IJ SOLN
INTRAMUSCULAR | Status: AC
  Filled 2021-08-07: qty 30

## 2021-08-07 MED ORDER — MIDAZOLAM HCL 2 MG/2ML IJ SOLN
INTRAMUSCULAR | Status: AC | PRN
Start: 2021-08-07 — End: 2021-08-07
  Administered 2021-08-07: 1 mg via INTRAVENOUS

## 2021-08-07 MED ORDER — FENTANYL CITRATE (PF) 100 MCG/2ML IJ SOLN
INTRAMUSCULAR | Status: AC | PRN
  Administered 2021-08-07: 25 ug via INTRAVENOUS

## 2021-08-07 MED ORDER — SODIUM CHLORIDE 0.9 % IV SOLN: INTRAVENOUS | Status: DC

## 2021-08-07 NOTE — H&P (Signed)
Chief Complaint: Patient was seen in consultation today for left chest wall nodule biopsy  at the request of Thayil,Thomas Michael  Referring Physician(s): Lincoln Brigham  Supervising Physician: Daryll Brod  Patient Status: [facility] - Out-pt  History of Present Illness: Thomas Michael is a 81 y.o. 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 Pt presented to sports medicine c/o left thigh pain x 3 months. MRI 07/21/21 revealed 4 cm destructive bone lesion involving intertrochanteric region of L hip w/ evidence of cotical breakthrough and soft tissue tumor outside bone. Pt then underwent CT chest 07/29/21 that showed mets to lungs, mediastimun, bilateral adrenal glands, mesentery, small bowel, nodule in pancreas and enhancing subcutaneous nodule posterior to the left shoulder/upper chest. [name], PA, has requested biopsy of posterior chest nodule to evaluate for metastatic disease. Dr. Earleen Newport, IR approved image guided biopsy, left chest wall.  Past Medical History:  Diagnosis Date   CAD (coronary artery disease)    Facial nerve disorder, unspecified    HLD (hyperlipidemia)    HTN (hypertension)    Jan 2010 He had a 95% mid LAD lesion. There was 99% stenosis at the distal edge of the stented area in the right coronary artery. The EF was 65%. He had drug-eluting stents placed in both the LAD and the right coronary artery.   Hypothyroidism    Obesity    Other facial nerve disorders    Renal cell carcinoma of right kidney (HCC)    Syncope    Unspecified ptosis of eyelid     Past Surgical History:  Procedure Laterality Date   CHOLECYSTECTOMY     CHOLECYSTECTOMY     CORONARY STENT PLACEMENT     NEPHRECTOMY RADICAL Right 2012   TONSILLECTOMY      Allergies: Promethazine-codeine  Medications: Prior to Admission medications   Medication Sig Start Date End Date Taking? Authorizing  Provider  Accu-Chek FastClix Lancets MISC check CBG 02/07/17   [provider]  acetaminophen (TYLENOL) 325 MG tablet 3 tablet as needed    [provider]  aspirin 81 MG EC tablet Take 81 mg by mouth daily.    [provider]  Blood Glucose Monitoring Suppl (ACCU-CHEK AVIVA PLUS) w/Device KIT check CBG 02/07/17   [provider]  Brompheniramine-Pseudoeph (DIMETAPP PO) Take by mouth. As needed for nasal congestion    [provider]  Cholecalciferol 50 MCG (2000 UT) TABS 1 capsule    [provider]  clopidogrel (PLAVIX) 75 MG tablet Take 75 mg by mouth daily.    [provider]  Coenzyme Q10 (COQ10 PO) Take 1 tablet by mouth.  Patient not taking: Reported on 07/28/2021    [provider]  Docusate Sodium (COLACE PO) Take by mouth as needed.    [provider]  DULoxetine (CYMBALTA) 30 MG capsule TAKE 1 CAPSULE BY MOUTH EVERY DAY 11/24/20   [name], MD  gabapentin (NEURONTIN) 100 MG capsule Take 100 mg by mouth daily.     [provider]  HYDROcodone-acetaminophen (NORCO/VICODIN) 5-325 MG tablet Take 1 tablet by mouth every 6 (six) hours as needed. 07/01/21   [provider]  incobotulinumtoxinA (XEOMIN) 50 units SOLR injection USE AS DIRECTED, 50 UNITS EVERY 3 MONTHS. Patient not taking: Reported on 07/28/2021 12/28/19   [name], MD  levothyroxine (SYNTHROID, LEVOTHROID) 200 MCG tablet Take 200 mcg by mouth daily.    [provider]  meloxicam (MOBIC) 15 MG tablet Take 15 mg by mouth every other day.  Patient not taking: Reported on 07/28/2021 02/03/17   [provider]  metoprolol (TOPROL-XL) 50 MG 24 hr tablet Take 50 mg by mouth daily.    [provider]  Multiple Vitamin (MULTIVITAMINS PO) 1 tablet    [provider]  niacin 500 MG CR capsule Take 500 mg by mouth at bedtime.    [provider]  Omega-3 Fatty Acids (FISH OIL PO) Take 1 tablet by mouth  daily.    [provider]  rOPINIRole (REQUIP) 0.25 MG tablet TAKE 2 TABLETS ONCE DAILY 1TO 3 HOURS BEFORE BEDTIME    [provider]  rosuvastatin (CRESTOR) 20 MG tablet Take 20 mg by mouth daily.    [provider]  sildenafil (VIAGRA) 100 MG tablet Take 100 mg by mouth as needed.    [provider]  traZODone (DESYREL) 100 MG tablet 200 mg at bedtime.  10/12/12   [provider]  valsartan-hydrochlorothiazide (DIOVAN-HCT) 160-12.5 MG per tablet  05/24/14   [provider]  VICTOZA 18 MG/3ML SOPN  03/30/17   [provider]  vitamin E 400 UNIT capsule Take 400 Units by mouth daily.    [provider]     Family History  Problem Relation Age of Onset   Gallbladder disease Mother    Heart disease Father    Breast cancer Sister 9   High blood pressure Sister     Social History   Socioeconomic History   Marital status: Married    Spouse name: Thomas Michael   Number of children: 4   Years of education: 14   Highest education level: Not on file  Occupational History   Occupation: part time    Employer: [employer]    Comment: Works for himself  Tobacco Use   Smoking status: Former    Packs/day: 2.00    Years: 12.00    Pack years: 24.00    Types: Cigarettes    Quit date: 07/19/1968    Years since quitting: 53.0   Smokeless tobacco: Never  Substance and Sexual Activity   Alcohol use: Yes    Alcohol/week: 2.0 standard drinks    Types: 2 Shots of liquor per week    Comment: rare   Drug use: No   Sexual activity: Not on file  Other Topics Concern   Not on file  Social History Narrative   Patient is self employed - retired. Patient lives at home with his wife Thomas Michael.   Caffeine two cups daily.   Left handed.   College two years.   Social Determinants of Health   Financial Resource Strain: Not on file  Food Insecurity: Not on file  Transportation Needs: Not on file  Physical Activity: Not on file   Stress: Not on file  Social Connections: Not on file    Review of Systems: A 12 point ROS discussed and pertinent positives are indicated in the HPI above.  All other systems are negative.  Review of Systems  All other systems reviewed and are negative.  Vital Signs: BP 137/71    Pulse 64    Temp 98.2 F (36.8 C) (Oral)    Resp 16    Ht 5' 6"  (1.676 m)    Wt 245 lb (111.1 kg)    SpO2 93%    BMI 39.54 kg/m   Physical Exam Constitutional:      Appearance: Normal appearance. He is not  ill-appearing.  HENT:     Head: Normocephalic and atraumatic.     Mouth/Throat:     Mouth: Mucous membranes are moist.     Pharynx: Oropharynx is clear.  Cardiovascular:     Rate and Rhythm: Normal rate and regular rhythm.     Pulses: Normal pulses.     Heart sounds: Normal heart sounds. No murmur heard.   No friction rub. No gallop.  Pulmonary:     Effort: Pulmonary effort is normal. No respiratory distress.     Breath sounds: Normal breath sounds. No stridor. No wheezing, rhonchi or rales.  Abdominal:     General: Bowel sounds are normal.     Tenderness: There is no abdominal tenderness. There is no guarding.  Musculoskeletal:     Right lower leg: No edema.     Left lower leg: No edema.  Skin:    General: Skin is warm and dry.  Neurological:     Mental Status: He is alert and oriented to person, place, and time.  Psychiatric:        Mood and Affect: Mood normal.        Behavior: Behavior normal.        Thought Content: Thought content normal.        Judgment: Judgment normal.    Imaging: CT ABDOMEN PELVIS W WO CONTRAST  Result Date: 07/29/2021 CLINICAL DATA:  History of renal cell carcinoma with right radical nephrectomy in 2012, recent left hip MRI showed a lytic mass of the left proximal femur. EXAM: CT CHEST WITH CONTRAST CT ABDOMEN AND PELVIS WITH AND WITHOUT CONTRAST TECHNIQUE: Multidetector CT imaging of the chest was performed during intravenous contrast administration.  Multidetector CT imaging of the abdomen and pelvis was performed following the standard protocol before and during bolus administration of intravenous contrast. RADIATION DOSE REDUCTION: This exam was performed according to the departmental dose-optimization program which includes automated exposure control, adjustment of the mA and/or kV according to patient size and/or use of iterative reconstruction technique. CONTRAST:  69m OMNIPAQUE IOHEXOL 350 MG/ML SOLN COMPARISON:  Multiple exams, including MRI abdomen 01/18/2013 and left hip MRI from 07/21/2021 as well as CT abdomen from 08/06/2014 FINDINGS: CT CHEST FINDINGS Cardiovascular: Coronary, aortic arch, and branch vessel atherosclerotic vascular disease. Mediastinum/Nodes: Pathologic and substantially enhancing thoracic adenopathy is present. Index left AP window lymph node 1.5 cm in short axis on image 40 series 11, abnormally enlarged. Left lower periaortic lymph node 2.1 cm in short axis on image 47 series 11. An adjacent left infrahilar node measures 1.6 cm in short axis, image 50 series 11. Small type 1 hiatal hernia. Lungs/Pleura: Several solid pulmonary nodules are present. 3 mm right upper lobe pulmonary nodule, image 40, series 15. 1.2 by 0.8 cm right middle lobe pulmonary nodule, image 93 series 15. 0.4 cm subpleural nodule posteriorly in the right upper lobe on image 36 series 15. 0.6 by 0.3 cm left upper lobe nodule on image 66 series 15. 1.1 by 0.8 cm left lower lobe nodule, image 87 series 15. 0.7 by 0.5 cm left lower lobe nodule on image 76 series 15. Musculoskeletal: A hyperdense subcutaneous nodule posterior to the left shoulder musculature on image 31 of series 11 measures 1.5 by 1.0 cm on image 31 series 11. Thoracic spondylosis with multilevel bridging osteophytes. CT ABDOMEN AND PELVIS FINDINGS Hepatobiliary: Cholecystectomy. 0.6 by 0.3 cm tiny enhancing focus at the dome of the right hepatic lobe on image 26 of series 6 on arterial phase  images, not well seen on other phases, probably incidental but potentially meriting surveillance. A second small focus of arterial phase enhancement medially in the right hepatic lobe on image 69 of series 7 measures 0.7 by 0.5 cm and likewise may merit surveillance in this clinical context. Pancreas: 0.6 by 0.5 cm focus of arterial phase enhancement in the pancreatic body on image 45 series 7 and image 39 series 6, a small metastatic lesion is not excluded. Spleen: Unremarkable Adrenals/Urinary Tract: New briskly arterial phase enhancing nodules are present in the adrenal glands, about 3 new nodules on each side compared to prior CT of 08/06/2014. The most cephalad of the left adrenal lesions measures 1.5 by 1.3 cm. Right nephrectomy without findings of locally recurrent mass along the nephrectomy bed. Small hypodense lesions in the left kidney are technically too small to characterize although statistically likely to be cysts. Urinary bladder nondistended, shaggy/micronodular appearance along the margin of the bladder roof anteriorly is nonspecific but at least some of this appearance is due to accentuated vascularity. A component of tumor nodularity along the anterior bladder roof is not excluded on image 128 series 6. Stomach/Bowel: Proximal transverse duodenal diverticula. Mildly redundant sigmoid colon. A multilobulated mesenteric mass measuring about 6.0 by 1.7 by 4.3 cm in central mesentery is closely associated with the margin of an adjacent loop of small bowel on images 67 through 83 of series 16. Vascular/Lymphatic: Atherosclerosis is present, including aortoiliac atherosclerotic disease. Reproductive: Unremarkable Other: Pre peritoneal nodule anterior to the dome of the urinary bladder is 0.7 cm in diameter on image 128 series 16. Musculoskeletal: Lytic destructive lesion of the left proximal femur noted on image 158 of series 16, as described on recent MRI. This is accompanied by high density/enhancing  lesion replacing the local fatty marrow. 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 IMPRESSION: 1. Widespread scattered metastatic disease, with most of the lesions demonstrating brisk arterial phase enhancement. Today's exam reveals metastatic lesions of the lungs, mediastinum, bilateral adrenal glands, mesentery and adjacent small bowel, and left proximal femur. Other suspicious lesions include a small arterial phase enhancing nodule in the pancreas, an enhancing subcutaneous nodule posterior to the left shoulder/upper chest, and a small pre peritoneal nodule anterior to the dome of the urinary bladder. Shaggy nodularity of the anterior superior urinary bladder may reflect metastatic lesions along the urinary bladder. 2. Two tiny arterial phase enhancing lesions in the liver are nonspecific but in this clinical context merit surveillance. 3. Other imaging findings of potential clinical significance: Aortic Atherosclerosis (ICD10-I70.0). Coronary atherosclerosis. Small type 1 hiatal hernia. Thoracolumbar spondylosis. 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 Systemic atherosclerosis. Electronically Signed   By: Van Clines M.D.   On: 07/29/2021 10:32   CT CHEST W CONTRAST  Result Date: 07/29/2021 CLINICAL DATA:  History of renal cell carcinoma with right radical nephrectomy in 2012, recent left hip MRI showed a lytic mass of the left proximal femur. EXAM: CT CHEST WITH CONTRAST CT ABDOMEN AND PELVIS WITH AND WITHOUT CONTRAST TECHNIQUE: Multidetector CT imaging of the chest was performed during intravenous contrast administration. Multidetector CT imaging of the abdomen and pelvis was performed following the standard protocol before and during bolus administration of intravenous contrast. RADIATION DOSE REDUCTION: This exam was  performed according to the departmental dose-optimization program which includes automated exposure control, adjustment of  the mA and/or kV according to patient size and/or use of iterative reconstruction technique. CONTRAST:  36m OMNIPAQUE IOHEXOL 350 MG/ML SOLN COMPARISON:  Multiple exams, including MRI abdomen 01/18/2013 and left hip MRI from 07/21/2021 as well as CT abdomen from 08/06/2014 FINDINGS: CT CHEST FINDINGS Cardiovascular: Coronary, aortic arch, and branch vessel atherosclerotic vascular disease. Mediastinum/Nodes: Pathologic and substantially enhancing thoracic adenopathy is present. Index left AP window lymph node 1.5 cm in short axis on image 40 series 11, abnormally enlarged. Left lower periaortic lymph node 2.1 cm in short axis on image 47 series 11. An adjacent left infrahilar node measures 1.6 cm in short axis, image 50 series 11. Small type 1 hiatal hernia. Lungs/Pleura: Several solid pulmonary nodules are present. 3 mm right upper lobe pulmonary nodule, image 40, series 15. 1.2 by 0.8 cm right middle lobe pulmonary nodule, image 93 series 15. 0.4 cm subpleural nodule posteriorly in the right upper lobe on image 36 series 15. 0.6 by 0.3 cm left upper lobe nodule on image 66 series 15. 1.1 by 0.8 cm left lower lobe nodule, image 87 series 15. 0.7 by 0.5 cm left lower lobe nodule on image 76 series 15. Musculoskeletal: A hyperdense subcutaneous nodule posterior to the left shoulder musculature on image 31 of series 11 measures 1.5 by 1.0 cm on image 31 series 11. Thoracic spondylosis with multilevel bridging osteophytes. CT ABDOMEN AND PELVIS FINDINGS Hepatobiliary: Cholecystectomy. 0.6 by 0.3 cm tiny enhancing focus at the dome of the right hepatic lobe on image 26 of series 6 on arterial phase images, not well seen on other phases, probably incidental but potentially meriting surveillance. A second small focus of arterial phase enhancement medially in the right hepatic lobe on image 69 of  series 7 measures 0.7 by 0.5 cm and likewise may merit surveillance in this clinical context. Pancreas: 0.6 by 0.5 cm focus of arterial phase enhancement in the pancreatic body on image 45 series 7 and image 39 series 6, a small metastatic lesion is not excluded. Spleen: Unremarkable Adrenals/Urinary Tract: New briskly arterial phase enhancing nodules are present in the adrenal glands, about 3 new nodules on each side compared to prior CT of 08/06/2014. The most cephalad of the left adrenal lesions measures 1.5 by 1.3 cm. Right nephrectomy without findings of locally recurrent mass along the nephrectomy bed. Small hypodense lesions in the left kidney are technically too small to characterize although statistically likely to be cysts. Urinary bladder nondistended, shaggy/micronodular appearance along the margin of the bladder roof anteriorly is nonspecific but at least some of this appearance is due to accentuated vascularity. A component of tumor nodularity along the anterior bladder roof is not excluded on image 128 series 6. Stomach/Bowel: Proximal transverse duodenal diverticula. Mildly redundant sigmoid colon. A multilobulated mesenteric mass measuring about 6.0 by 1.7 by 4.3 cm in central mesentery is closely associated with the margin of an adjacent loop of small bowel on images 67 through 83 of series 16. Vascular/Lymphatic: Atherosclerosis is present, including aortoiliac atherosclerotic disease. Reproductive: Unremarkable Other: Pre peritoneal nodule anterior to the dome of the urinary bladder is 0.7 cm in diameter on image 128 series 16. Musculoskeletal: Lytic destructive lesion of the left proximal femur noted on image 158 of series 16, as described on recent MRI. This is accompanied by high density/enhancing lesion replacing the local fatty marrow. 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  IMPRESSION: 1. Widespread scattered metastatic disease, with most of the lesions demonstrating brisk arterial phase enhancement. Today's exam reveals metastatic lesions of the lungs, mediastinum, bilateral adrenal glands, mesentery and adjacent small bowel, and left proximal femur. Other suspicious lesions include a small arterial phase enhancing nodule in the pancreas, an enhancing subcutaneous nodule posterior to the left shoulder/upper chest, and a small pre peritoneal nodule anterior to the dome of the urinary bladder. Shaggy nodularity of the anterior superior urinary bladder may reflect metastatic lesions along the urinary bladder. 2. Two tiny arterial phase enhancing lesions in the liver are nonspecific but in this clinical context merit surveillance. 3. Other imaging findings of potential clinical significance: Aortic Atherosclerosis (ICD10-I70.0). Coronary atherosclerosis. Small type 1 hiatal hernia. Thoracolumbar spondylosis. 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 Systemic atherosclerosis. Electronically Signed   By: Van Clines M.D.   On: 07/29/2021 10:32   MR HIP LEFT WO CONTRAST  Result Date: 07/22/2021 CLINICAL DATA:  Left hip and thigh pain for 2 months. Prior history of renal cell carcinoma. EXAM: MR OF THE LEFT HIP WITHOUT CONTRAST TECHNIQUE: Multiplanar, multisequence MR imaging was performed. No intravenous contrast was administered. COMPARISON:  Radiographs 06/23/2021 FINDINGS: There is a 4 cm lytic destructive bone lesion involving the intertrochanteric region of the left hip most notably involving the lesser trochanter. Evidence of cortical breakthrough and soft tissue tumor outside the bone anteriorly and medially. There is also signal abnormality in the iliopsoas tendon near its attachment on the lesser trochanter which could be tendinopathy or tumor involvement but no rupture at this point. I do not see any other bone  lesions involving the bony pelvis or hips. The pubic symphysis and SI joints are intact. No evidence of muscle tear or muscle lesions. No significant intrapelvic abnormalities are identified. IMPRESSION: 1. 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 Evidence of cortical breakthrough and soft tissue tumor outside the bone. This would be highly suspicious for a metastatic lesion given history of renal cell carcinoma. PET-CT may be helpful for further evaluation. 2. Edema in the iliopsoas tendon near its attachment on the lesser trochanter which could be tendinopathy or tumor involvement but no rupture. 3. No other bone lesions are identified. Electronically Signed   By: Marijo Sanes M.D.   On: 07/22/2021 08:41    Labs:  CBC: Recent Labs    07/28/21 1429  WBC 8.7  HGB 13.2  HCT 40.5  PLT 264    COAGS: No results for input(s): INR, APTT in the last 8760 hours.  BMP: Recent Labs    07/28/21 1429  NA 136  K 3.9  CL 102  CO2 25  GLUCOSE 108*  BUN 18  CALCIUM 9.8  CREATININE 1.34*  GFRNONAA 54*    LIVER FUNCTION TESTS: Recent Labs    07/28/21 1429  BILITOT 0.5  AST 14*  ALT 21  ALKPHOS 55  PROT 8.1  ALBUMIN 4.5    TUMOR MARKERS: No results for input(s): AFPTM, CEA, CA199, CHROMGRNA in the last 8760 hours.  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 Pt presented to sports medicine c/o left thigh pain x 3 months. MRI 07/21/21 revealed 4 cm destructive bone lesion involving intertrochanteric region of L hip w/ evidence of cotical breakthrough and soft tissue tumor outside bone.  Pt then underwent CT chest 07/29/21 that showed mets to lungs, mediastimun, bilateral adrenal glands, mesentery, small bowel, nodule in pancreas and enhancing subcutaneous nodule posterior to the left  shoulder/upper chest. [name], PA, has requested biopsy of posterior chest nodule to evaluate for metastatic disease. Dr. Earleen Newport, IR approved image guided biopsy, left chest wall.   Pt resting quietly on stretcher. He is A&O, calm and pleasant. Pt states that he thought he was here for abdominal biopsy. The order from his provider was read as well as the approval for procedure. Pt verbalized understanding of planned procedure and is in agreement.  He confirms that he is NPO per order.  VSS  Risks and benefits of left chest wall nodule biopsy was discussed with the patient and/or patient's family including, but not limited to bleeding, infection, damage to adjacent structures or low yield requiring additional tests. All of the questions were answered and there is agreement to proceed.  Consent signed and in chart.   Thank you for this interesting consult.  I greatly enjoyed meeting WYLIE COON and look forward to participating in their care.  A copy of this report was sent to the requesting provider on this date.  Electronically Signed: Tyson Alias, NP 08/07/2021, 9:59 AM   I spent a total of 20 minutes in face to face in clinical consultation, greater than 50% of which was counseling/coordinating care for left chest wall nodule biopsy.

## 2021-08-07 NOTE — Procedures (Signed)
Interventional Radiology Procedure Note  Procedure: [place] RT POSTERIOR SHOULDER SUBCUTANEOUS NODULE CORE BX    Complications: None  Estimated Blood Loss:  MIN  Findings: 18 G CORE X 4    M. Daryll Brod, MD

## 2021-08-11 ENCOUNTER — Telehealth: Payer: Self-pay | Admitting: Physician Assistant

## 2021-08-11 ENCOUNTER — Telehealth: Payer: Self-pay | Admitting: Oncology

## 2021-08-11 LAB — SURGICAL PATHOLOGY

## 2021-08-11 NOTE — Telephone Encounter (Signed)
I called Thomas Michael to review the pathology report from biopsy of the right posterior shoulder nodule. Findings confirmed metastatic carcinoma, compatible with renal cell carcinoma. Patient will be scheduled for a consultation with medical oncologist, Dr. Alen Blew. Patient expressed understanding with the plan provided.

## 2021-08-11 NOTE — Telephone Encounter (Signed)
Scheduled appt per 1/24 secure chat with Murray Hodgkins. Pt is aware of appt date and time. Pt is aware to arrive 15 mins prior to appt time.

## 2021-08-27 ENCOUNTER — Inpatient Hospital Stay: Payer: Medicare Other | Attending: Physician Assistant | Admitting: Oncology

## 2021-08-27 ENCOUNTER — Other Ambulatory Visit: Payer: Self-pay

## 2021-08-27 VITALS — BP 121/57 | HR 67 | Temp 97.8°F | Resp 16 | Ht 66.0 in | Wt 248.7 lb

## 2021-08-27 DIAGNOSIS — Z5112 Encounter for antineoplastic immunotherapy: Secondary | ICD-10-CM | POA: Insufficient documentation

## 2021-08-27 DIAGNOSIS — C7951 Secondary malignant neoplasm of bone: Secondary | ICD-10-CM | POA: Diagnosis not present

## 2021-08-27 DIAGNOSIS — K449 Diaphragmatic hernia without obstruction or gangrene: Secondary | ICD-10-CM | POA: Insufficient documentation

## 2021-08-27 DIAGNOSIS — Z79891 Long term (current) use of opiate analgesic: Secondary | ICD-10-CM | POA: Insufficient documentation

## 2021-08-27 DIAGNOSIS — Z85528 Personal history of other malignant neoplasm of kidney: Secondary | ICD-10-CM | POA: Insufficient documentation

## 2021-08-27 DIAGNOSIS — Z791 Long term (current) use of non-steroidal anti-inflammatories (NSAID): Secondary | ICD-10-CM | POA: Insufficient documentation

## 2021-08-27 DIAGNOSIS — E785 Hyperlipidemia, unspecified: Secondary | ICD-10-CM | POA: Diagnosis not present

## 2021-08-27 DIAGNOSIS — C7802 Secondary malignant neoplasm of left lung: Secondary | ICD-10-CM | POA: Insufficient documentation

## 2021-08-27 DIAGNOSIS — C7801 Secondary malignant neoplasm of right lung: Secondary | ICD-10-CM | POA: Insufficient documentation

## 2021-08-27 DIAGNOSIS — Z7982 Long term (current) use of aspirin: Secondary | ICD-10-CM | POA: Insufficient documentation

## 2021-08-27 DIAGNOSIS — C4922 Malignant neoplasm of connective and soft tissue of left lower limb, including hip: Secondary | ICD-10-CM

## 2021-08-27 DIAGNOSIS — I10 Essential (primary) hypertension: Secondary | ICD-10-CM | POA: Diagnosis not present

## 2021-08-27 DIAGNOSIS — I2510 Atherosclerotic heart disease of native coronary artery without angina pectoris: Secondary | ICD-10-CM | POA: Insufficient documentation

## 2021-08-27 DIAGNOSIS — M47814 Spondylosis without myelopathy or radiculopathy, thoracic region: Secondary | ICD-10-CM | POA: Insufficient documentation

## 2021-08-27 DIAGNOSIS — Z79899 Other long term (current) drug therapy: Secondary | ICD-10-CM | POA: Diagnosis not present

## 2021-08-27 DIAGNOSIS — C7989 Secondary malignant neoplasm of other specified sites: Secondary | ICD-10-CM | POA: Diagnosis not present

## 2021-08-27 DIAGNOSIS — Z905 Acquired absence of kidney: Secondary | ICD-10-CM | POA: Diagnosis not present

## 2021-08-27 DIAGNOSIS — C7972 Secondary malignant neoplasm of left adrenal gland: Secondary | ICD-10-CM | POA: Diagnosis not present

## 2021-08-27 DIAGNOSIS — G893 Neoplasm related pain (acute) (chronic): Secondary | ICD-10-CM | POA: Diagnosis not present

## 2021-08-27 DIAGNOSIS — E669 Obesity, unspecified: Secondary | ICD-10-CM | POA: Insufficient documentation

## 2021-08-27 DIAGNOSIS — E039 Hypothyroidism, unspecified: Secondary | ICD-10-CM | POA: Insufficient documentation

## 2021-08-27 DIAGNOSIS — Z955 Presence of coronary angioplasty implant and graft: Secondary | ICD-10-CM | POA: Insufficient documentation

## 2021-08-27 DIAGNOSIS — C799 Secondary malignant neoplasm of unspecified site: Secondary | ICD-10-CM

## 2021-08-27 NOTE — Progress Notes (Signed)
Reason for the request:    Kidney cancer  HPI: I was asked by Dr. Stephanie Acre to evaluate Thomas Michael for the evaluation of kidney cancer.  He is a 81 year old man with history of coronary artery disease, hypertension among other comorbid conditions.  He was diagnosed with renal cell carcinoma in 2012.  He underwent right radical nephrectomy completed by Dr. Alinda Money on Dec 10, 2010.  He was found to have 6 cm tumor, clear-cell subtype with nuclear grade 3 with a final pathological stage T1b.  He has remained disease-free till recently.  He presented with left hip pain and his work-up at that time included an MRI of the left hip on July 21, 2021 showed a 4 cm destructive lesion involving the intertrochanteric region of the left hip.  CT scan chest abdomen and pelvis completed on July 29, 2021 showed widespread metastatic lesion occluding the lungs, mediastinum, adrenal and left proximal femur.  He underwent biopsy completed on August 07, 2021 which involved a soft tissue biopsy of the left shoulder which confirmed the presence of metastatic renal cell carcinoma.  Clinically, he reports pain in his left hip and leg but overall manageable with hydrocodone.  He is taking 1-2 hydrocodone's a day with occasional Tylenol in between.  His appetite remains excellent and performance status is unchanged.  He is ambulatory with the help of a walker without any falls or syncope.  He does not report any headaches, blurry vision, syncope or seizures. Does not report any fevers, chills or sweats.  Does not report any cough, wheezing or hemoptysis.  Does not report any chest pain, palpitation, orthopnea or leg edema.  Does not report any nausea, vomiting or abdominal pain.  Does not report any constipation or diarrhea.  Does not report any skeletal complaints.    Does not report frequency, urgency or hematuria.  Does not report any skin rashes or lesions. Does not report any heat or cold intolerance.  Does not report any  lymphadenopathy or petechiae.  Does not report any anxiety or depression.  Remaining review of systems is negative.     Past Medical History:  Diagnosis Date   CAD (coronary artery disease)    Facial nerve disorder, unspecified    HLD (hyperlipidemia)    HTN (hypertension)    Jan 2010 He had a 95% mid LAD lesion. There was 99% stenosis at the distal edge of the stented area in the right coronary artery. The EF was 65%. He had drug-eluting stents placed in both the LAD and the right coronary artery.   Hypothyroidism    Obesity    Other facial nerve disorders    Renal cell carcinoma of right kidney (HCC)    Syncope    Unspecified ptosis of eyelid   :   Past Surgical History:  Procedure Laterality Date   CHOLECYSTECTOMY     CHOLECYSTECTOMY     CORONARY STENT PLACEMENT     NEPHRECTOMY RADICAL Right 2012   TONSILLECTOMY    :   Current Outpatient Medications:    Accu-Chek FastClix Lancets MISC, check CBG, Disp: , Rfl:    acetaminophen (TYLENOL) 325 MG tablet, 3 tablet as needed, Disp: , Rfl:    aspirin 81 MG EC tablet, Take 81 mg by mouth daily., Disp: , Rfl:    Blood Glucose Monitoring Suppl (ACCU-CHEK AVIVA PLUS) w/Device KIT, check CBG, Disp: , Rfl:    Brompheniramine-Pseudoeph (DIMETAPP PO), Take by mouth. As needed for nasal congestion, Disp: , Rfl:  Cholecalciferol 50 MCG (2000 UT) TABS, 1 capsule, Disp: , Rfl:    clopidogrel (PLAVIX) 75 MG tablet, Take 75 mg by mouth daily., Disp: , Rfl:    Coenzyme Q10 (COQ10 PO), Take 1 tablet by mouth.  (Patient not taking: Reported on 07/28/2021), Disp: , Rfl:    Docusate Sodium (COLACE PO), Take by mouth as needed., Disp: , Rfl:    DULoxetine (CYMBALTA) 30 MG capsule, TAKE 1 CAPSULE BY MOUTH EVERY DAY, Disp: 90 capsule, Rfl: 0   gabapentin (NEURONTIN) 100 MG capsule, Take 100 mg by mouth daily. , Disp: , Rfl:    HYDROcodone-acetaminophen (NORCO/VICODIN) 5-325 MG tablet, Take 1 tablet by mouth every 6 (six) hours as needed., Disp: ,  Rfl:    incobotulinumtoxinA (XEOMIN) 50 units SOLR injection, USE AS DIRECTED, 50 UNITS EVERY 3 MONTHS. (Patient not taking: Reported on 07/28/2021), Disp: 1 each, Rfl: 3   levothyroxine (SYNTHROID, LEVOTHROID) 200 MCG tablet, Take 200 mcg by mouth daily., Disp: , Rfl:    meloxicam (MOBIC) 15 MG tablet, Take 15 mg by mouth every other day.  (Patient not taking: Reported on 07/28/2021), Disp: , Rfl:    metoprolol (TOPROL-XL) 50 MG 24 hr tablet, Take 50 mg by mouth daily., Disp: , Rfl:    Multiple Vitamin (MULTIVITAMINS PO), 1 tablet, Disp: , Rfl:    niacin 500 MG CR capsule, Take 500 mg by mouth at bedtime., Disp: , Rfl:    Omega-3 Fatty Acids (FISH OIL PO), Take 1 tablet by mouth daily., Disp: , Rfl:    rOPINIRole (REQUIP) 0.25 MG tablet, TAKE 2 TABLETS ONCE DAILY 1TO 3 HOURS BEFORE BEDTIME, Disp: , Rfl:    rosuvastatin (CRESTOR) 20 MG tablet, Take 20 mg by mouth daily., Disp: , Rfl:    sildenafil (VIAGRA) 100 MG tablet, Take 100 mg by mouth as needed., Disp: , Rfl:    traZODone (DESYREL) 100 MG tablet, 200 mg at bedtime. , Disp: , Rfl:    valsartan-hydrochlorothiazide (DIOVAN-HCT) 160-12.5 MG per tablet, , Disp: , Rfl:    VICTOZA 18 MG/3ML SOPN, , Disp: , Rfl:    vitamin E 400 UNIT capsule, Take 400 Units by mouth daily., Disp: , Rfl: :   Allergies  Allergen Reactions   Promethazine-Codeine Itching  :   Family History  Problem Relation Age of Onset   Gallbladder disease Mother    Heart disease Father    Breast cancer Sister 76   High blood pressure Sister   :   Social History   Socioeconomic History   Marital status: Married    Spouse name: Thomas Michael   Number of children: 4   Years of education: 14   Highest education level: Not on file  Occupational History   Occupation: part time    Employer: [employer]    Comment: Works for himself  Tobacco Use   Smoking status: Former    Packs/day: 2.00    Years: 12.00    Pack years: 24.00    Types: Cigarettes    Quit date:  07/19/1968    Years since quitting: 53.1   Smokeless tobacco: Never  Substance and Sexual Activity   Alcohol use: Yes    Alcohol/week: 2.0 standard drinks    Types: 2 Shots of liquor per week    Comment: rare   Drug use: No   Sexual activity: Not on file  Other Topics Concern   Not on file  Social History Narrative   Patient is self employed - retired. Patient  lives at home with his wife Thomas Michael.   Caffeine two cups daily.   Left handed.   College two years.   Social Determinants of Health   Financial Resource Strain: Not on file  Food Insecurity: Not on file  Transportation Needs: Not on file  Physical Activity: Not on file  Stress: Not on file  Social Connections: Not on file  Intimate Partner Violence: Not on file  :  Pertinent items are noted in HPI.  Exam: Blood pressure (!) 121/57, pulse 67, temperature 97.8 F (36.6 C), temperature source Temporal, resp. rate 16, height _0  (1.676 m), weight 248 lb 11.2 oz (112.8 kg), SpO2 97 %. ECOG 1 General appearance: alert and cooperative appeared without distress. Head: atraumatic without any abnormalities. Eyes: conjunctivae/corneas clear. PERRL.  Sclera anicteric. Throat: lips, mucosa, and tongue normal; without oral thrush or ulcers. Resp: clear to auscultation bilaterally without rhonchi, wheezes or dullness to percussion. Cardio: regular rate and rhythm, S1, S2 normal, no murmur, click, rub or gallop GI: soft, non-tender; bowel sounds normal; no masses,  no organomegaly Skin: Skin color, texture, turgor normal. No rashes or lesions Lymph nodes: Cervical, supraclavicular, and axillary nodes normal. Neurologic: Grossly normal without any motor, sensory or deep tendon reflexes. Musculoskeletal: No joint deformity or effusion.    CT ABDOMEN PELVIS W WO CONTRAST  Result Date: 07/29/2021 CLINICAL DATA:  History of renal cell carcinoma with right radical nephrectomy in 2012, recent left hip MRI showed a lytic mass of the  left proximal femur. EXAM: CT CHEST WITH CONTRAST CT ABDOMEN AND PELVIS WITH AND WITHOUT CONTRAST TECHNIQUE: Multidetector CT imaging of the chest was performed during intravenous contrast administration. Multidetector CT imaging of the abdomen and pelvis was performed following the standard protocol before and during bolus administration of intravenous contrast. RADIATION DOSE REDUCTION: This exam was performed according to the departmental dose-optimization program which includes automated exposure control, adjustment of the mA and/or kV according to patient size and/or use of iterative reconstruction technique. CONTRAST:  73m OMNIPAQUE IOHEXOL 350 MG/ML SOLN COMPARISON:  Multiple exams, including MRI abdomen 01/18/2013 and left hip MRI from 07/21/2021 as well as CT abdomen from 08/06/2014 FINDINGS: CT CHEST FINDINGS Cardiovascular: Coronary, aortic arch, and branch vessel atherosclerotic vascular disease. Mediastinum/Nodes: Pathologic and substantially enhancing thoracic adenopathy is present. Index left AP window lymph node 1.5 cm in short axis on image 40 series 11, abnormally enlarged. Left lower periaortic lymph node 2.1 cm in short axis on image 47 series 11. An adjacent left infrahilar node measures 1.6 cm in short axis, image 50 series 11. Small type 1 hiatal hernia. Lungs/Pleura: Several solid pulmonary nodules are present. 3 mm right upper lobe pulmonary nodule, image 40, series 15. 1.2 by 0.8 cm right middle lobe pulmonary nodule, image 93 series 15. 0.4 cm subpleural nodule posteriorly in the right upper lobe on image 36 series 15. 0.6 by 0.3 cm left upper lobe nodule on image 66 series 15. 1.1 by 0.8 cm left lower lobe nodule, image 87 series 15. 0.7 by 0.5 cm left lower lobe nodule on image 76 series 15. Musculoskeletal: A hyperdense subcutaneous nodule posterior to the left shoulder musculature on image 31 of series 11 measures 1.5 by 1.0 cm on image 31 series 11. Thoracic spondylosis with  multilevel bridging osteophytes. CT ABDOMEN AND PELVIS FINDINGS Hepatobiliary: Cholecystectomy. 0.6 by 0.3 cm tiny enhancing focus at the dome of the right hepatic lobe on image 26 of series 6 on arterial phase images, not well  seen on other phases, probably incidental but potentially meriting surveillance. A second small focus of arterial phase enhancement medially in the right hepatic lobe on image 69 of series 7 measures 0.7 by 0.5 cm and likewise may merit surveillance in this clinical context. Pancreas: 0.6 by 0.5 cm focus of arterial phase enhancement in the pancreatic body on image 45 series 7 and image 39 series 6, a small metastatic lesion is not excluded. Spleen: Unremarkable Adrenals/Urinary Tract: New briskly arterial phase enhancing nodules are present in the adrenal glands, about 3 new nodules on each side compared to prior CT of 08/06/2014. The most cephalad of the left adrenal lesions measures 1.5 by 1.3 cm. Right nephrectomy without findings of locally recurrent mass along the nephrectomy bed. Small hypodense lesions in the left kidney are technically too small to characterize although statistically likely to be cysts. Urinary bladder nondistended, shaggy/micronodular appearance along the margin of the bladder roof anteriorly is nonspecific but at least some of this appearance is due to accentuated vascularity. A component of tumor nodularity along the anterior bladder roof is not excluded on image 128 series 6. Stomach/Bowel: Proximal transverse duodenal diverticula. Mildly redundant sigmoid colon. A multilobulated mesenteric mass measuring about 6.0 by 1.7 by 4.3 cm in central mesentery is closely associated with the margin of an adjacent loop of small bowel on images 67 through 83 of series 16. Vascular/Lymphatic: Atherosclerosis is present, including aortoiliac atherosclerotic disease. Reproductive: Unremarkable Other: Pre peritoneal nodule anterior to the dome of the urinary bladder is 0.7 cm  in diameter on image 128 series 16. Musculoskeletal: Lytic destructive lesion of the left proximal femur noted on image 158 of series 16, as described on recent MRI. This is accompanied by high density/enhancing lesion replacing the local fatty marrow. 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 IMPRESSION: 1. Widespread scattered metastatic disease, with most of the lesions demonstrating brisk arterial phase enhancement. Today's exam reveals metastatic lesions of the lungs, mediastinum, bilateral adrenal glands, mesentery and adjacent small bowel, and left proximal femur. Other suspicious lesions include a small arterial phase enhancing nodule in the pancreas, an enhancing subcutaneous nodule posterior to the left shoulder/upper chest, and a small pre peritoneal nodule anterior to the dome of the urinary bladder. Shaggy nodularity of the anterior superior urinary bladder may reflect metastatic lesions along the urinary bladder. 2. Two tiny arterial phase enhancing lesions in the liver are nonspecific but in this clinical context merit surveillance. 3. Other imaging findings of potential clinical significance: Aortic Atherosclerosis (ICD10-I70.0). Coronary atherosclerosis. Small type 1 hiatal hernia. Thoracolumbar spondylosis. 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 Systemic atherosclerosis. Electronically Signed   By: Van Clines M.D.   On: 07/29/2021 10:32   CT CHEST W CONTRAST  Result Date: 07/29/2021 CLINICAL DATA:  History of renal cell carcinoma with right radical nephrectomy in 2012, recent left hip MRI showed a lytic mass of the left proximal femur. EXAM: CT CHEST WITH CONTRAST CT ABDOMEN AND PELVIS WITH AND WITHOUT CONTRAST TECHNIQUE: Multidetector CT imaging of the chest was performed during intravenous contrast  administration. Multidetector CT imaging of the abdomen and pelvis was performed following the standard protocol before and during bolus administration of intravenous contrast. RADIATION DOSE REDUCTION: This exam was performed according to the departmental dose-optimization program which includes automated exposure control, adjustment of the mA and/or  kV according to patient size and/or use of iterative reconstruction technique. CONTRAST:  77m OMNIPAQUE IOHEXOL 350 MG/ML SOLN COMPARISON:  Multiple exams, including MRI abdomen 01/18/2013 and left hip MRI from 07/21/2021 as well as CT abdomen from 08/06/2014 FINDINGS: CT CHEST FINDINGS Cardiovascular: Coronary, aortic arch, and branch vessel atherosclerotic vascular disease. Mediastinum/Nodes: Pathologic and substantially enhancing thoracic adenopathy is present. Index left AP window lymph node 1.5 cm in short axis on image 40 series 11, abnormally enlarged. Left lower periaortic lymph node 2.1 cm in short axis on image 47 series 11. An adjacent left infrahilar node measures 1.6 cm in short axis, image 50 series 11. Small type 1 hiatal hernia. Lungs/Pleura: Several solid pulmonary nodules are present. 3 mm right upper lobe pulmonary nodule, image 40, series 15. 1.2 by 0.8 cm right middle lobe pulmonary nodule, image 93 series 15. 0.4 cm subpleural nodule posteriorly in the right upper lobe on image 36 series 15. 0.6 by 0.3 cm left upper lobe nodule on image 66 series 15. 1.1 by 0.8 cm left lower lobe nodule, image 87 series 15. 0.7 by 0.5 cm left lower lobe nodule on image 76 series 15. Musculoskeletal: A hyperdense subcutaneous nodule posterior to the left shoulder musculature on image 31 of series 11 measures 1.5 by 1.0 cm on image 31 series 11. Thoracic spondylosis with multilevel bridging osteophytes. CT ABDOMEN AND PELVIS FINDINGS Hepatobiliary: Cholecystectomy. 0.6 by 0.3 cm tiny enhancing focus at the dome of the right hepatic lobe on image 26 of series 6 on  arterial phase images, not well seen on other phases, probably incidental but potentially meriting surveillance. A second small focus of arterial phase enhancement medially in the right hepatic lobe on image 69 of series 7 measures 0.7 by 0.5 cm and likewise may merit surveillance in this clinical context. Pancreas: 0.6 by 0.5 cm focus of arterial phase enhancement in the pancreatic body on image 45 series 7 and image 39 series 6, a small metastatic lesion is not excluded. Spleen: Unremarkable Adrenals/Urinary Tract: New briskly arterial phase enhancing nodules are present in the adrenal glands, about 3 new nodules on each side compared to prior CT of 08/06/2014. The most cephalad of the left adrenal lesions measures 1.5 by 1.3 cm. Right nephrectomy without findings of locally recurrent mass along the nephrectomy bed. Small hypodense lesions in the left kidney are technically too small to characterize although statistically likely to be cysts. Urinary bladder nondistended, shaggy/micronodular appearance along the margin of the bladder roof anteriorly is nonspecific but at least some of this appearance is due to accentuated vascularity. A component of tumor nodularity along the anterior bladder roof is not excluded on image 128 series 6. Stomach/Bowel: Proximal transverse duodenal diverticula. Mildly redundant sigmoid colon. A multilobulated mesenteric mass measuring about 6.0 by 1.7 by 4.3 cm in central mesentery is closely associated with the margin of an adjacent loop of small bowel on images 67 through 83 of series 16. Vascular/Lymphatic: Atherosclerosis is present, including aortoiliac atherosclerotic disease. Reproductive: Unremarkable Other: Pre peritoneal nodule anterior to the dome of the urinary bladder is 0.7 cm in diameter on image 128 series 16. Musculoskeletal: Lytic destructive lesion of the left proximal femur noted on image 158 of series 16, as described on recent MRI. This is accompanied by high  density/enhancing lesion replacing the local fatty marrow. 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 IMPRESSION: 1.  Widespread scattered metastatic disease, with most of the lesions demonstrating brisk arterial phase enhancement. Today's exam reveals metastatic lesions of the lungs, mediastinum, bilateral adrenal glands, mesentery and adjacent small bowel, and left proximal femur. Other suspicious lesions include a small arterial phase enhancing nodule in the pancreas, an enhancing subcutaneous nodule posterior to the left shoulder/upper chest, and a small pre peritoneal nodule anterior to the dome of the urinary bladder. Shaggy nodularity of the anterior superior urinary bladder may reflect metastatic lesions along the urinary bladder. 2. Two tiny arterial phase enhancing lesions in the liver are nonspecific but in this clinical context merit surveillance. 3. Other imaging findings of potential clinical significance: Aortic Atherosclerosis (ICD10-I70.0). Coronary atherosclerosis. Small type 1 hiatal hernia. Thoracolumbar spondylosis. 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 Systemic atherosclerosis. Electronically Signed   By: Van Clines M.D.   On: 07/29/2021 10:32    Assessment and Plan:    81 year old with:  1.  Advanced renal cell carcinoma with metastatic disease noted in the lung, mediastinum, bone among other areas had documented in January 2023.  He initially presented with stage Ib clear-cell renal cell carcinoma in 2012.  The natural course of this disease was reviewed at this time and treatment course was or discussed.  He clearly has advanced disease with soft tissue as well as bone involvement with intermediate risk category.  Treatment options for advanced kidney cancer were reviewed including oral targeted  therapy, immunotherapy single agent, therapy combination or combining immunotherapy with oral targeted therapy.  The ideal scenario is to treat him with oral targeted therapy and immunotherapy for breast objective response rate which is desirable at this time given his high volume disease.  Combination of axitinib with Beryle Flock were reviewed at this time.  Complication associated associated with oral targeted therapy includes hypertension, diarrhea, weight loss and hand-foot syndrome were reviewed.  Complication associated with immunotherapy that include nausea, fatigue, diarrhea, autoimmune complications as well as dermatological toxicities.  After discussion today, he will think about up to let me know in the near future.  2.  Left hip and femur bone involvement: I recommended orthopedic evaluation for possible need for fixation.  Radiation therapy could also be considered for pain palliation.   3.  Prognosis and goals of care: This was discussed today in detail and the option of supportive care only and hospice enrollment was also discussed.  His performance status remained reasonable and aggressive measures are warranted but he understands his condition is incurable.  He values quality of life and will consider that in making his decision.  4.  Follow-up: To be determined pending his decision regarding treatment.  60  minutes were dedicated to this visit. The time was spent on reviewing laboratory data, imaging studies, discussing treatment options, reviewing pathology results.  And answering questions regarding future plan.    A copy of this consult has been forwarded to the requesting physician.

## 2021-08-28 ENCOUNTER — Telehealth: Payer: Self-pay | Admitting: *Deleted

## 2021-08-28 ENCOUNTER — Other Ambulatory Visit: Payer: Self-pay | Admitting: Oncology

## 2021-08-28 ENCOUNTER — Encounter: Payer: Self-pay | Admitting: Oncology

## 2021-08-28 ENCOUNTER — Other Ambulatory Visit (HOSPITAL_COMMUNITY): Payer: Self-pay

## 2021-08-28 ENCOUNTER — Telehealth: Payer: Self-pay

## 2021-08-28 DIAGNOSIS — E032 Hypothyroidism due to medicaments and other exogenous substances: Secondary | ICD-10-CM

## 2021-08-28 DIAGNOSIS — C649 Malignant neoplasm of unspecified kidney, except renal pelvis: Secondary | ICD-10-CM

## 2021-08-28 MED ORDER — AXITINIB 5 MG PO TABS
5.0000 mg | ORAL_TABLET | Freq: Every day | ORAL | 1 refills | Status: AC
  Filled 2021-08-28 – 2021-09-01 (×2): qty 30, 30d supply, fill #0

## 2021-08-28 MED ORDER — PROCHLORPERAZINE MALEATE 10 MG PO TABS
10.0000 mg | ORAL_TABLET | Freq: Four times a day (QID) | ORAL | 0 refills | Status: DC | PRN
  Filled 2021-08-28: qty 30, 8d supply, fill #0

## 2021-08-28 NOTE — Telephone Encounter (Signed)
Yvone Neu called to say that he wants to start the combo treatment of oral chemo and immunotherapy

## 2021-08-28 NOTE — Progress Notes (Signed)
START ON PATHWAY REGIMEN - Renal Cell     A cycle is every 21 days:     Axitinib      Pembrolizumab   **Always confirm dose/schedule in your pharmacy ordering system**  Patient Characteristics: Stage IV (Unresected T4M0 or Any T, M1)/Metastatic Disease, Clear Cell, First Line, Intermediate or Poor Risk Therapeutic Status: Stage IV (Unresected T4M0 or Any T, M1)/Metastatic Disease Histology: Clear Cell Line of Therapy: First Line Risk Status: Intermediate Risk Intent of Therapy: Non-Curative / Palliative Intent, Discussed with Patient 

## 2021-08-28 NOTE — Telephone Encounter (Addendum)
Oral Oncology Pharmacist Encounter  Received new prescription for axitinib (Inlyta) for the treatment of advanced renal cell carcinoma in conjunction with pembrolizumab, planned duration until disease progression or unacceptable toxicity. Prescription dose and frequency assessed, patient will start at 5mg  daily and increase depending on MD discretion.   Labs from 2/10 are standing for CBC, CMP, and TSH assessed, will review once final result has been posted.  Current medication list in Epic reviewed, DDIs with Inlyta identified: none  Evaluated chart and no patient barriers to medication adherence noted.   Patient agreement for treatment documented in MD note on 08/28/21.  Prescription has been e-scribed to the [facility] for benefits analysis and approval.  Oral Oncology Clinic will continue to follow for insurance authorization, copayment issues, initial counseling and start date.  Drema Halon, PharmD Hematology/Oncology Clinical Pharmacist [facility] [phone] 08/28/2021 11:22 AM

## 2021-08-28 NOTE — Progress Notes (Signed)
Patient reported willingness and readiness to proceed with systemic treatment with combination of axitinib and pembrolizumab.  We will start axitinib at 5 mg daily and escalate depending on his tolerance.  This dose can be reduced down if he has complications which was reiterated to him.  Anticipate to start the first cycle of therapy on the week of September 08, 2021.  Education class will be arranged for him as well.

## 2021-08-28 NOTE — Telephone Encounter (Signed)
Oral Oncology Patient Advocate Encounter  Was successful in securing patient a $10000 grant from [employer] to provide copayment coverage for Inlyta.  This will keep the out of pocket expense at $0.     [employer] ID: [id number]  The billing information is as follows and has been shared with [place]: [id number] PCN: [id number] Member ID: [id number] Group ID: [id number] Dates of Eligibility: 07/29/21 through 07/28/22  Fund:  Renal Cell  [place] [facility] [place] Phone [phone] Fax [phone] 08/28/2021 11:42 AM

## 2021-08-28 NOTE — Telephone Encounter (Signed)
Oral Oncology Patient Advocate Encounter  Prior Authorization for Thomas Michael has been approved.    PA# [id number]  Effective dates: 08/14/2021 through 08/28/2022  Patients co-pay is $2359.80  Oral Oncology Clinic will continue to follow.   [place] Patient [place] Phone [phone] Fax [phone] 08/28/2021 11:38 AM

## 2021-08-28 NOTE — Telephone Encounter (Signed)
Oral Oncology Patient Advocate Encounter   Received notification from [facility] that prior authorization for Inlyta is required.   PA submitted on CoverMyMeds Key [id number] Status is pending   Oral Oncology Clinic will continue to follow.  [place] Patient [place] Phone [phone] Fax [phone] 08/28/2021 11:17 AM

## 2021-09-01 ENCOUNTER — Inpatient Hospital Stay: Payer: Medicare Other

## 2021-09-01 ENCOUNTER — Other Ambulatory Visit (HOSPITAL_COMMUNITY): Payer: Self-pay

## 2021-09-01 NOTE — Telephone Encounter (Signed)
Oral Chemotherapy Pharmacist Encounter  I spoke with patient for overview of: Inlyta (axitinib) for the treatment of previously treated, advanced or metastatic renal cell carcinoma, planned duration until disease progression or unacceptable toxicity.   Counseled patient on administration, dosing, side effects, monitoring, drug-food interactions, safe handling, storage, and disposal. Okay per MD to start medication without baseline TSH prior to starting.  Patient will take Inlyta 5mg  tablets, 1 tablet by mouth once daily, without regard to food, with a glass of water.  Patient instructed to avoid grapefruit and grapefruit juice while on therapy with Inlyta.  Inlyta start date: 09/08/2021  Adverse effects include but are not limited to: hypertension, hand-foot syndrome, nausea, vomiting, diarrhea, fatigue, dysphonia (hoarseness), and abnormal laboratory values.   Patient will obtain anti diarrheal and alert the office of 4 or more loose stools above baseline.  Inlyta will be held at least 24 hours prior to surgery and resumed at discretion of treating physician based on wound healing  Reviewed with patient importance of keeping a medication schedule and plan for any missed doses. No barriers to medication adherence identified.  Medication reconciliation performed and medication/allergy list updated.  Insurance authorization for Bartholomew Boards has been obtained. Test claim at the pharmacy revealed copayment $0 for 1st fill of 30 days. Patients wife picked up the medication from [place] on 09/01/21.  Patient informed the pharmacy will reach out 5-7 days prior to needing next fill of Inlyta to coordinate continued medication acquisition to prevent break in therapy.  All questions answered.  Mr. And Mrs. Jiminez voiced understanding and appreciation.   Medication education handout placed in mail for patient. Patient knows to call the office with questions or concerns. Oral  Chemotherapy Clinic phone number provided to patient.   Drema Halon, PharmD Hematology/Oncology Clinical Pharmacist [facility] [phone] 09/01/2021   2:39 PM

## 2021-09-02 ENCOUNTER — Other Ambulatory Visit: Payer: Self-pay

## 2021-09-02 ENCOUNTER — Ambulatory Visit (INDEPENDENT_AMBULATORY_CARE_PROVIDER_SITE_OTHER): Payer: Medicare Other | Admitting: Physician Assistant

## 2021-09-02 VITALS — Ht 66.0 in | Wt 246.8 lb

## 2021-09-02 DIAGNOSIS — M899 Disorder of bone, unspecified: Secondary | ICD-10-CM

## 2021-09-02 NOTE — Progress Notes (Signed)
Office Visit Note   Patient: Thomas Michael           Date of Birth: Feb 12, 1941           MRN: [id number] Visit Date: 09/02/2021              Requested by: Wyatt Portela, MD [place],  [place] [id number] PCP: [name], MD   Assessment & Plan: Visit Diagnoses:  1. Lytic bone lesion of left femur     Plan: Due to the destructive lesion involving the left hip intertrochanteric region with cortical destruction recommend palliative treatment of the lytic lesion this would be an IM nailing of the left femur.  Patient understands this is to treat an impending fracture.  He would like to talk with his wife prior to scheduling surgery but feels that he would like to undergo this as he does want to enjoy what is "left of his life".  Per the patient's request we will contact his wife tomorrow afternoon after he has had time to talk with her today and discuss the surgery with her also before setting up surgery.  Questions were encouraged and answered at length by Dr. Ninfa Linden and myself.  Follow-Up Instructions: Return for post op.   Orders:  No orders of the defined types were placed in this encounter.  No orders of the defined types were placed in this encounter.     Procedures: No procedures performed   Clinical Data: No additional findings.   Subjective: No chief complaint on file.   HPI [name] very pleasant 81 year old male who were seen for evaluation of lytic lesion vomiting this left femur.  Patient was referred from Dr. Alen Blew.  He has a remote history of renal cell carcinoma in 2012 underwent right radical nephrectomy.  Patient reports that he been having pain increasing pain in his left thigh for the last 3 to 4 months.  Underwent an MRI of his left hip January 3rd of this year and was found to have a lytic lesion involving the left intertrochanteric region of his hip.  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  He understands that his disease process is "terminal ".  He was sent here for evaluation of the lytic lesion and possible palliative treatment.  He is walking with a rolling walker.  He reports significant left thigh pain feels muscle on exam.  Review of Systems Please see HPI otherwise negative  Objective: Vital Signs: Ht 5\' 6"  (1.676 m)    Wt 246 lb 12.8 oz (111.9 kg)    BMI 39.83 kg/m   Physical Exam Constitutional:      Appearance: He is not ill-appearing or diaphoretic.  Pulmonary:     Effort: Pulmonary effort is normal.  Neurological:     Mental Status: He is oriented to person, place, and time.  Psychiatric:        Mood and Affect: Mood normal.        Behavior: Behavior normal.        Judgment: Judgment normal.    Ortho Exam Bilateral knees good range of motion of both knees without pain.  Nontender about the knees.  Calves are supple and nontender bilaterally.  Has pain limiting range of motion with any attempts of ranging the left hip. Specialty Comments:  No specialty comments available.  Imaging: No results found.   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   Other generalized ischemic cerebrovascular disease    Unspecified ptosis of eyelid    Facial nerve spasticity    Hemifacial spasm    Pre-op exam 11/19/2010   OBSTRUCTIVE SLEEP APNEA 11/06/2008   HYPOTHYROIDISM 09/05/2008   MORBID OBESITY 09/05/2008   RISK OF SLEEP APNEA 09/05/2008   HYPERCHOLESTEROLEMIA 09/04/2008   HYPERTENSION 09/04/2008   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 There was 99% stenosis at the distal edge of the stented area in the right coronary artery. The EF was 65%. He had drug-eluting stents placed in both the LAD and the right coronary artery.   Hypothyroidism    Obesity    Other facial nerve disorders    Renal cell carcinoma of right kidney (HCC)    Syncope    Unspecified ptosis of eyelid     Family History  Problem Relation Age of Onset   Gallbladder disease Mother    Heart disease Father    Breast cancer Sister 57   High blood pressure Sister     Past Surgical History:  Procedure Laterality Date   CHOLECYSTECTOMY     CHOLECYSTECTOMY     CORONARY STENT PLACEMENT     NEPHRECTOMY RADICAL Right 2012   TONSILLECTOMY     Social History   Occupational History   Occupation: part time    Employer: [employer]    Comment: Works for himself  Tobacco Use   Smoking status: Former    Packs/day: 2.00    Years: 12.00    Pack years: 24.00    Types: Cigarettes    Quit date: 07/19/1968    Years since quitting: 53.1   Smokeless tobacco: Never  Substance and Sexual Activity   Alcohol use: Yes    Alcohol/week: 2.0 standard drinks    Types: 2 Shots of liquor per week    Comment: rare   Drug use: No   Sexual activity: Not on file

## 2021-09-03 ENCOUNTER — Telehealth: Payer: Self-pay

## 2021-09-03 ENCOUNTER — Telehealth: Payer: Self-pay | Admitting: Physician Assistant

## 2021-09-03 NOTE — Telephone Encounter (Signed)
Patient called asked if Artis Delay would call him back. Patient said Artis Delay is expecting his call. The number to contact patient is [phone]

## 2021-09-03 NOTE — Telephone Encounter (Signed)
Pt called to advise that he had his orthopedic consult with  Dr. Trevor Mace PA on 2/16. The plan is: IM nailing of the left femur to prevent fracture. Patient requesting clarification on if/how this will effect the start of his new treatment plan to begin on  09/08/21. Informed pt that Dr. Alen Blew will be out of the office until 09/07/21.   Routed to Dr. Alen Blew

## 2021-09-03 NOTE — Telephone Encounter (Signed)
Please advise 

## 2021-09-04 ENCOUNTER — Encounter: Payer: Self-pay | Admitting: Oncology

## 2021-09-04 DIAGNOSIS — G894 Chronic pain syndrome: Secondary | ICD-10-CM | POA: Diagnosis not present

## 2021-09-04 DIAGNOSIS — C649 Malignant neoplasm of unspecified kidney, except renal pelvis: Secondary | ICD-10-CM | POA: Diagnosis not present

## 2021-09-08 ENCOUNTER — Other Ambulatory Visit: Payer: Self-pay

## 2021-09-08 ENCOUNTER — Inpatient Hospital Stay: Payer: Medicare Other

## 2021-09-08 VITALS — BP 111/68 | HR 81 | Temp 97.9°F | Resp 18 | Wt 241.0 lb

## 2021-09-08 DIAGNOSIS — C7802 Secondary malignant neoplasm of left lung: Secondary | ICD-10-CM | POA: Diagnosis not present

## 2021-09-08 DIAGNOSIS — E119 Type 2 diabetes mellitus without complications: Secondary | ICD-10-CM | POA: Diagnosis not present

## 2021-09-08 DIAGNOSIS — C649 Malignant neoplasm of unspecified kidney, except renal pelvis: Secondary | ICD-10-CM

## 2021-09-08 DIAGNOSIS — C7951 Secondary malignant neoplasm of bone: Secondary | ICD-10-CM | POA: Diagnosis not present

## 2021-09-08 DIAGNOSIS — C7801 Secondary malignant neoplasm of right lung: Secondary | ICD-10-CM | POA: Diagnosis not present

## 2021-09-08 DIAGNOSIS — C7972 Secondary malignant neoplasm of left adrenal gland: Secondary | ICD-10-CM | POA: Diagnosis not present

## 2021-09-08 DIAGNOSIS — E032 Hypothyroidism due to medicaments and other exogenous substances: Secondary | ICD-10-CM

## 2021-09-08 DIAGNOSIS — Z5112 Encounter for antineoplastic immunotherapy: Secondary | ICD-10-CM | POA: Diagnosis not present

## 2021-09-08 DIAGNOSIS — H2513 Age-related nuclear cataract, bilateral: Secondary | ICD-10-CM | POA: Diagnosis not present

## 2021-09-08 DIAGNOSIS — H5213 Myopia, bilateral: Secondary | ICD-10-CM | POA: Diagnosis not present

## 2021-09-08 DIAGNOSIS — Z85528 Personal history of other malignant neoplasm of kidney: Secondary | ICD-10-CM | POA: Diagnosis not present

## 2021-09-08 LAB — CMP (CANCER CENTER ONLY)
ALT: 14 U/L (ref 0–44)
AST: 14 U/L — ABNORMAL LOW (ref 15–41)
Albumin: 4 g/dL (ref 3.5–5.0)
Alkaline Phosphatase: 56 U/L (ref 38–126)
Anion gap: 11 (ref 5–15)
BUN: 21 mg/dL (ref 8–23)
CO2: 21 mmol/L — ABNORMAL LOW (ref 22–32)
Calcium: 9.2 mg/dL (ref 8.9–10.3)
Chloride: 102 mmol/L (ref 98–111)
Creatinine: 1.55 mg/dL — ABNORMAL HIGH (ref 0.61–1.24)
GFR, Estimated: 45 mL/min — ABNORMAL LOW (ref >60)
Glucose, Bld: 270 mg/dL — ABNORMAL HIGH (ref 70–99)
Potassium: 3.7 mmol/L (ref 3.5–5.1)
Sodium: 134 mmol/L — ABNORMAL LOW (ref 135–145)
Total Bilirubin: 0.7 mg/dL (ref 0.3–1.2)
Total Protein: 7.1 g/dL (ref 6.5–8.1)

## 2021-09-08 LAB — CBC WITH DIFFERENTIAL (CANCER CENTER ONLY)
Abs Immature Granulocytes: 0.06 10*3/uL (ref 0.00–0.07)
Basophils Absolute: 0.1 10*3/uL (ref 0.0–0.1)
Basophils Relative: 1 %
Eosinophils Absolute: 0.2 10*3/uL (ref 0.0–0.5)
Eosinophils Relative: 2 %
HCT: 35.7 % — ABNORMAL LOW (ref 39.0–52.0)
Hemoglobin: 11.5 g/dL — ABNORMAL LOW (ref 13.0–17.0)
Immature Granulocytes: 1 %
Lymphocytes Relative: 26 %
Lymphs Abs: 2.2 10*3/uL (ref 0.7–4.0)
MCH: 29.3 pg (ref 26.0–34.0)
MCHC: 32.2 g/dL (ref 30.0–36.0)
MCV: 90.8 fL (ref 80.0–100.0)
Monocytes Absolute: 0.4 10*3/uL (ref 0.1–1.0)
Monocytes Relative: 5 %
Neutro Abs: 5.6 10*3/uL (ref 1.7–7.7)
Neutrophils Relative %: 65 %
Platelet Count: 222 10*3/uL (ref 150–400)
RBC: 3.93 MIL/uL — ABNORMAL LOW (ref 4.22–5.81)
RDW: 13.2 % (ref 11.5–15.5)
WBC Count: 8.5 10*3/uL (ref 4.0–10.5)
nRBC: 0 % (ref 0.0–0.2)

## 2021-09-08 LAB — TSH: TSH: 1.626 u[IU]/mL (ref 0.320–4.118)

## 2021-09-08 MED ORDER — SODIUM CHLORIDE 0.9 % IV SOLN
200.0000 mg | Freq: Once | INTRAVENOUS | Status: AC
  Administered 2021-09-08: 200 mg via INTRAVENOUS
  Filled 2021-09-08: qty 200

## 2021-09-08 MED ORDER — SODIUM CHLORIDE 0.9 % IV SOLN: Freq: Once | INTRAVENOUS | Status: AC

## 2021-09-08 NOTE — Progress Notes (Signed)
Ok to treat with elevated SCR  of 1.55 per Dr.Shadad

## 2021-09-08 NOTE — Patient Instructions (Signed)
[facility]  Discharge Instructions: Thank you for choosing [facility] to provide your oncology and hematology care.   If you have a lab appointment with the [facility], please go directly to the [facility] and check in at the registration area.   Wear comfortable clothing and clothing appropriate for easy access to any Portacath or PICC line.   We strive to give you quality time with your provider. You may need to reschedule your appointment if you arrive late (15 or more minutes).  Arriving late affects you and other patients whose appointments are after yours.  Also, if you miss three or more appointments without notifying the office, you may be dismissed from the clinic at the providers discretion.      For prescription refill requests, have your pharmacy contact our office and allow 72 hours for refills to be completed.    Today you received the following chemotherapy and/or immunotherapy agents Beryle Flock       To help prevent nausea and vomiting after your treatment, we encourage you to take your nausea medication as directed.  BELOW ARE SYMPTOMS THAT SHOULD BE REPORTED IMMEDIATELY: *FEVER GREATER THAN 100.4 F (38 C) OR HIGHER *CHILLS OR SWEATING *NAUSEA AND VOMITING THAT IS NOT CONTROLLED WITH YOUR NAUSEA MEDICATION *UNUSUAL SHORTNESS OF BREATH *UNUSUAL BRUISING OR BLEEDING *URINARY PROBLEMS (pain or burning when urinating, or frequent urination) *BOWEL PROBLEMS (unusual diarrhea, constipation, pain near the anus) TENDERNESS IN MOUTH AND THROAT WITH OR WITHOUT PRESENCE OF ULCERS (sore throat, sores in mouth, or a toothache) UNUSUAL RASH, SWELLING OR PAIN  UNUSUAL VAGINAL DISCHARGE OR ITCHING   Items with * indicate a potential emergency and should be followed up as soon as possible or go to the Emergency Department if any problems should occur.  Please show the CHEMOTHERAPY ALERT CARD or IMMUNOTHERAPY ALERT CARD at check-in to  the Emergency Department and triage nurse.  Should you have questions after your visit or need to cancel or reschedule your appointment, please contact [facility]  Dept: [phone]  and follow the prompts.  Office hours are 8:00 a.m. to 4:30 p.m. Monday - Friday. Please note that voicemails left after 4:00 p.m. may not be returned until the following business day.  We are closed weekends and major holidays. You have access to a nurse at all times for urgent questions. Please call the main number to the clinic Dept: [phone] and follow the prompts.   For any non-urgent questions, you may also contact your provider using MyChart. We now offer e-Visits for anyone 67 and older to request care online for non-urgent symptoms. For details visit [email or web address].   Also download the MyChart app! Go to the app store, search "MyChart", open the app, select [facility], and log in with your MyChart username and password.  Due to Covid, a mask is required upon entering the hospital/clinic. If you do not have a mask, one will be given to you upon arrival. For doctor visits, patients may have 1 support person aged 54 or older with them. For treatment visits, patients cannot have anyone with them due to current Covid guidelines and our immunocompromised population.   Pembrolizumab injection What is this medication? PEMBROLIZUMAB (pem broe liz ue mab) is a monoclonal antibody. It is used to treat certain types of cancer. This medicine may be used for other purposes; ask your health care provider or pharmacist if you have questions. COMMON BRAND NAME(S): [employer]  What should I tell my care team before I take this medication? °They need to know if you have any of these conditions: °autoimmune diseases like Crohn's disease, ulcerative colitis, or lupus °have had or planning to have an allogeneic stem cell transplant (uses someone else's stem cells) °history of organ  transplant °history of chest radiation °nervous system problems like myasthenia gravis or Guillain-Barre syndrome °an unusual or allergic reaction to pembrolizumab, other medicines, foods, dyes, or preservatives °pregnant or trying to get pregnant °breast-feeding °How should I use this medication? °This medicine is for infusion into a vein. It is given by a health care professional in a hospital or clinic setting. °A special MedGuide will be given to you before each treatment. Be sure to read this information carefully each time. °Talk to your pediatrician regarding the use of this medicine in children. While this drug may be prescribed for children as young as 6 months for selected conditions, precautions do apply. °Overdosage: If you think you have taken too much of this medicine contact a poison control center or emergency room at once. °NOTE: This medicine is only for you. Do not share this medicine with others. °What if I miss a dose? °It is important not to miss your dose. Call your doctor or health care professional if you are unable to keep an appointment. °What may interact with this medication? °Interactions have not been studied. °This list may not describe all possible interactions. Give your health care provider a list of all the medicines, herbs, non-prescription drugs, or dietary supplements you use. Also tell them if you smoke, drink alcohol, or use illegal drugs. Some items may interact with your medicine. °What should I watch for while using this medication? °Your condition will be monitored carefully while you are receiving this medicine. °You may need blood work done while you are taking this medicine. °Do not become pregnant while taking this medicine or for 4 months after stopping it. Women should inform their doctor if they wish to become pregnant or think they might be pregnant. There is a potential for serious side effects to an unborn child. Talk to your health care professional or  pharmacist for more information. Do not breast-feed an infant while taking this medicine or for 4 months after the last dose. °What side effects may I notice from receiving this medication? °Side effects that you should report to your doctor or health care professional as soon as possible: °allergic reactions like skin rash, itching or hives, swelling of the face, lips, or tongue °bloody or black, tarry °breathing problems °changes in vision °chest pain °chills °confusion °constipation °cough °diarrhea °dizziness or feeling faint or lightheaded °fast or irregular heartbeat °fever °flushing °joint pain °low blood counts - this medicine may decrease the number of white blood cells, red blood cells and platelets. You may be at increased risk for infections and bleeding. °muscle pain °muscle weakness °pain, tingling, numbness in the hands or feet °persistent headache °redness, blistering, peeling or loosening of the skin, including inside the mouth °signs and symptoms of high blood sugar such as dizziness; dry mouth; dry skin; fruity breath; nausea; stomach pain; increased hunger or thirst; increased urination °signs and symptoms of kidney injury like trouble passing urine or change in the amount of urine °signs and symptoms of liver injury like dark urine, light-colored stools, loss of appetite, nausea, right upper belly pain, yellowing of the eyes or skin °sweating °swollen lymph nodes °weight loss °Side effects that usually do not require   medical attention (report to your doctor or health care professional if they continue or are bothersome): decreased appetite hair loss tiredness This list may not describe all possible side effects. Call your doctor for medical advice about side effects. You may report side effects to FDA at 1-800-FDA-1088. Where should I keep my medication? This drug is given in a hospital or clinic and will not be stored at home. NOTE: This sheet is a summary. It may not cover all possible  information. If you have questions about this medicine, talk to your doctor, pharmacist, or health care provider.  2022 Elsevier/Gold Standard (2021-03-24 00:00:00)

## 2021-09-09 ENCOUNTER — Telehealth: Payer: Self-pay | Admitting: *Deleted

## 2021-09-09 NOTE — Telephone Encounter (Signed)
Left message on identified vm to call us back to let us know how he did with his treatment 09/08/20

## 2021-09-09 NOTE — Telephone Encounter (Signed)
-----   Message from [place], RN sent at 09/08/2021 11:29 AM EST ----- Regarding: Thomas Michael first time [place] Pt tolerated first time [place] well on 09/08/21, will need follow up call.

## 2021-09-10 ENCOUNTER — Other Ambulatory Visit (HOSPITAL_COMMUNITY): Payer: Self-pay

## 2021-09-14 ENCOUNTER — Other Ambulatory Visit (HOSPITAL_BASED_OUTPATIENT_CLINIC_OR_DEPARTMENT_OTHER): Payer: Self-pay

## 2021-09-14 NOTE — Progress Notes (Signed)
Pt. Need orders for upcoming surgery.

## 2021-09-14 NOTE — Patient Instructions (Addendum)
DUE TO COVID-19 ONLY ONE VISITOR IS ALLOWED TO COME WITH YOU AND STAY IN THE WAITING ROOM ONLY DURING PRE OP AND PROCEDURE.   **NO VISITORS ARE ALLOWED IN THE SHORT STAY AREA OR RECOVERY ROOM!!**  IF YOU WILL BE ADMITTED INTO THE HOSPITAL YOU ARE ALLOWED ONLY TWO SUPPORT PEOPLE DURING VISITATION HOURS ONLY (7 AM -8PM)   The support person(s) must pass our screening, gel in and out, and wear a mask at all times, including in the patients room. Patients must also wear a mask when staff or their support person are in the room. Visitors GUEST BADGE MUST BE WORN VISIBLY  One adult visitor may remain with you overnight and MUST be in the room by 8 P.M.  No visitors under the age of 76. Any visitor under the age of 60 must be accompanied by an adult.    COVID SWAB TESTING MUST BE COMPLETED ON: 09/16/21    Site: [facility] [place] [place] Enter: Main Entrance have a seat in the waiting area to the right of main entrance (DO NOT [place]!!!!!) Dial: [phone] to alert staff you have arrived  You are not required to quarantine, however you are required to wear a well-fitted mask when you are out and around people not in your household.  Hand Hygiene often Do NOT share personal items Notify your provider if you are in close contact with someone who has COVID or you develop fever 100.4 or greater, new onset of sneezing, cough, sore throat, shortness of breath or body aches.  [place], [address], must go inside of the hospital, NOT A DRIVE THRU!  (Must self quarantine after testing. Follow instructions on handout.)       Your procedure is scheduled on: 09/18/21   Report to [facility] Main Entrance    Report to admitting at : 11:15 AM   Call this number if you have problems the morning of surgery [phone]   Do not eat food :After Midnight.   After Midnight you may have the  following liquids until: 10:30 AM DAY OF SURGERY  Water Black Coffee (sugar ok, NO MILK/CREAM OR CREAMERS)  Tea (sugar ok, NO MILK/CREAM OR CREAMERS) regular and decaf                             Plain Jell-O (NO RED)                                           Fruit ices (not with fruit pulp, NO RED)                                     Popsicles (NO RED)                                                                  Juice: apple, WHITE grape, WHITE cranberry Sports drinks like Gatorade (NO RED) Clear broth(vegetable,chicken,beef  Oral Hygiene is also important to reduce your risk of infection.                                    Remember - BRUSH YOUR TEETH THE MORNING OF SURGERY WITH YOUR REGULAR TOOTHPASTE   Do NOT smoke after Midnight   Take these medicines the morning of surgery with A SIP OF WATER: ,ropenirol,metoprolol,levothyroxine.Tylenol and hydrocodone as needed. How to Manage Your Diabetes Before and After Surgery  Why is it important to control my blood sugar before and after surgery? Improving blood sugar levels before and after surgery helps healing and can limit problems. A way of improving blood sugar control is eating a healthy diet by:  Eating less sugar and carbohydrates  Increasing activity/exercise  Talking with your doctor about reaching your blood sugar goals High blood sugars (greater than 180 mg/dL) can raise your risk of infections and slow your recovery, so you will need to focus on controlling your diabetes during the weeks before surgery. Make sure that the doctor who takes care of your diabetes knows about your planned surgery including the date and location.  How do I manage my blood sugar before surgery? Check your blood sugar at least 4 times a day, starting 2 days before surgery, to make sure that the level is not too high or low. Check your blood sugar the morning of your surgery when you wake up and every 2 hours until you get to the Short Stay  unit. If your blood sugar is less than 70 mg/dL, you will need to treat for low blood sugar: Do not take insulin. Treat a low blood sugar (less than 70 mg/dL) with  cup of clear juice (cranberry or apple), 4 glucose tablets, OR glucose gel. Recheck blood sugar in 15 minutes after treatment (to make sure it is greater than 70 mg/dL). If your blood sugar is not greater than 70 mg/dL on recheck, call [phone] for further instructions. Report your blood sugar to the short stay nurse when you get to Short Stay.  If you are admitted to the hospital after surgery: Your blood sugar will be checked by the staff and you will probably be given insulin after surgery (instead of oral diabetes medicines) to make sure you have good blood sugar levels. The goal for blood sugar control after surgery is 80-180 mg/dL.  WHAT DO I DO ABOUT MY DIABETES MEDICATION?  THE DAY BEFORE SURGERY, take victosa as usual     THE MORNING OF SURGERY, DO NOT take victosa.  The day of surgery, do not take other diabetes injectables, including Byetta (exenatide), Bydureon (exenatide ER), Victoza (liraglutide), or Trulicity (dulaglutide).  DO NOT TAKE ANY ORAL DIABETIC MEDICATIONS DAY OF YOUR SURGERY                              You may not have any metal on your body including hair pins, jewelry, and body piercing             Do not wear lotions, powders, perfumes/cologne, or deodorant              Men may shave face and neck.   Do not bring valuables to the hospital. [place].   Contacts,  dentures or bridgework may not be worn into surgery.   Bring small overnight bag day of surgery.    Patients discharged on the day of surgery will not be allowed to drive home.  Someone NEEDS to stay with you for the first 24 hours after anesthesia.   Special Instructions: Bring a copy of your healthcare power of attorney and living will documents         the day of surgery if  you haven't scanned them before.              Please read over the following fact sheets you were given: IF YOU HAVE QUESTIONS ABOUT YOUR PRE-OP INSTRUCTIONS PLEASE CALL [phone]     [facility] - Preparing for Surgery Before surgery, you can play an important role.  Because skin is not sterile, your skin needs to be as free of germs as possible.  You can reduce the number of germs on your skin by washing with CHG (chlorahexidine gluconate) soap before surgery.  CHG is an antiseptic cleaner which kills germs and bonds with the skin to continue killing germs even after washing. Please DO NOT use if you have an allergy to CHG or antibacterial soaps.  If your skin becomes reddened/irritated stop using the CHG and inform your nurse when you arrive at Short Stay. Do not shave (including legs and underarms) for at least 48 hours prior to the first CHG shower.  You may shave your face/neck. Please follow these instructions carefully:  1.  Shower with CHG Soap the night before surgery and the  morning of Surgery.  2.  If you choose to wash your hair, wash your hair first as usual with your  normal  shampoo.  3.  After you shampoo, rinse your hair and body thoroughly to remove the  shampoo.                           4.  Use CHG as you would any other liquid soap.  You can apply chg directly  to the skin and wash                       Gently with a scrungie or clean washcloth.  5.  Apply the CHG Soap to your body ONLY FROM THE NECK DOWN.   Do not use on face/ open                           Wound or open sores. Avoid contact with eyes, ears mouth and genitals (private parts).                       Wash face,  Genitals (private parts) with your normal soap.             6.  Wash thoroughly, paying special attention to the area where your surgery  will be performed.  7.  Thoroughly rinse your body with warm water from the neck down.  8.  DO NOT shower/wash with your normal soap after using and rinsing off   the CHG Soap.                9.  Pat yourself dry with a clean towel.            10.  Wear clean pajamas.  11.  Place clean sheets on your bed the night of your first shower and do not  sleep with pets. Day of Surgery : Do not apply any lotions/deodorants the morning of surgery.  Please wear clean clothes to the hospital/surgery center.  FAILURE TO FOLLOW THESE INSTRUCTIONS MAY RESULT IN THE CANCELLATION OF YOUR SURGERY PATIENT SIGNATURE_________________________________  NURSE SIGNATURE__________________________________  ________________________________________________________________________

## 2021-09-15 ENCOUNTER — Other Ambulatory Visit: Payer: Self-pay

## 2021-09-15 ENCOUNTER — Encounter (HOSPITAL_COMMUNITY)
Admission: RE | Admit: 2021-09-15 | Discharge: 2021-09-15 | Disposition: A | Discharge status: Home / Self Care | Payer: Medicare Other | Source: Ambulatory Visit | Attending: Orthopaedic Surgery | Admitting: Orthopaedic Surgery

## 2021-09-15 ENCOUNTER — Encounter (HOSPITAL_COMMUNITY): Payer: Self-pay

## 2021-09-15 DIAGNOSIS — M84552A Pathological fracture in neoplastic disease, left femur, initial encounter for fracture: Secondary | ICD-10-CM | POA: Diagnosis not present

## 2021-09-15 DIAGNOSIS — E1122 Type 2 diabetes mellitus with diabetic chronic kidney disease: Secondary | ICD-10-CM | POA: Insufficient documentation

## 2021-09-15 DIAGNOSIS — I10 Essential (primary) hypertension: Secondary | ICD-10-CM | POA: Diagnosis not present

## 2021-09-15 DIAGNOSIS — Z905 Acquired absence of kidney: Secondary | ICD-10-CM | POA: Insufficient documentation

## 2021-09-15 DIAGNOSIS — N1831 Chronic kidney disease, stage 3a: Secondary | ICD-10-CM | POA: Diagnosis not present

## 2021-09-15 DIAGNOSIS — I2510 Atherosclerotic heart disease of native coronary artery without angina pectoris: Secondary | ICD-10-CM | POA: Insufficient documentation

## 2021-09-15 DIAGNOSIS — I3481 Nonrheumatic mitral (valve) annulus calcification: Secondary | ICD-10-CM | POA: Diagnosis not present

## 2021-09-15 DIAGNOSIS — M25559 Pain in unspecified hip: Secondary | ICD-10-CM | POA: Insufficient documentation

## 2021-09-15 DIAGNOSIS — C649 Malignant neoplasm of unspecified kidney, except renal pelvis: Secondary | ICD-10-CM | POA: Insufficient documentation

## 2021-09-15 DIAGNOSIS — E119 Type 2 diabetes mellitus without complications: Secondary | ICD-10-CM

## 2021-09-15 DIAGNOSIS — Z01818 Encounter for other preprocedural examination: Secondary | ICD-10-CM | POA: Insufficient documentation

## 2021-09-15 DIAGNOSIS — C799 Secondary malignant neoplasm of unspecified site: Secondary | ICD-10-CM | POA: Diagnosis not present

## 2021-09-15 DIAGNOSIS — Z87891 Personal history of nicotine dependence: Secondary | ICD-10-CM | POA: Diagnosis not present

## 2021-09-15 HISTORY — DX: Dyspnea, unspecified: R06.00

## 2021-09-15 HISTORY — DX: Type 2 diabetes mellitus without complications: E11.9

## 2021-09-16 ENCOUNTER — Encounter (HOSPITAL_COMMUNITY)
Admission: RE | Admit: 2021-09-16 | Discharge: 2021-09-16 | Disposition: A | Discharge status: Home / Self Care | Payer: Medicare Other | Source: Ambulatory Visit | Attending: Orthopaedic Surgery | Admitting: Orthopaedic Surgery

## 2021-09-16 DIAGNOSIS — Z20822 Contact with and (suspected) exposure to covid-19: Secondary | ICD-10-CM | POA: Insufficient documentation

## 2021-09-16 DIAGNOSIS — Z01812 Encounter for preprocedural laboratory examination: Secondary | ICD-10-CM | POA: Insufficient documentation

## 2021-09-16 DIAGNOSIS — Z01818 Encounter for other preprocedural examination: Secondary | ICD-10-CM | POA: Diagnosis not present

## 2021-09-16 DIAGNOSIS — E119 Type 2 diabetes mellitus without complications: Secondary | ICD-10-CM | POA: Insufficient documentation

## 2021-09-16 LAB — SARS CORONAVIRUS 2 (TAT 6-24 HRS): SARS Coronavirus 2: NEGATIVE

## 2021-09-16 LAB — HEMOGLOBIN A1C
Hgb A1c MFr Bld: 5.7 % — ABNORMAL HIGH (ref 4.8–5.6)
Mean Plasma Glucose: 116.89 mg/dL

## 2021-09-16 LAB — GLUCOSE, CAPILLARY: Glucose-Capillary: 136 mg/dL — ABNORMAL HIGH (ref 70–99)

## 2021-09-17 ENCOUNTER — Other Ambulatory Visit: Payer: Self-pay | Admitting: Physician Assistant

## 2021-09-17 ENCOUNTER — Encounter (HOSPITAL_COMMUNITY): Payer: Self-pay | Admitting: Physician Assistant

## 2021-09-17 ENCOUNTER — Telehealth: Payer: Self-pay | Admitting: *Deleted

## 2021-09-17 DIAGNOSIS — M899 Disorder of bone, unspecified: Secondary | ICD-10-CM

## 2021-09-17 MED ORDER — OXYCODONE HCL 5 MG PO CAPS: 5.0000 mg | ORAL_CAPSULE | ORAL | 0 refills | Status: DC | PRN

## 2021-09-17 NOTE — Progress Notes (Signed)
For Short Stay: ?COVID SWAB appointment date: 09/16/21 ?Date of COVID positive in last 90 days: N/A ? ?Bowel Prep reminder:N/A ? ? ?For Anesthesia: ?PCP - Dr. [name] ?Cardiologist - N/A ? ?Chest x-ray -  ?EKG -  ?Stress Test -  ?ECHO -  ?Cardiac Cath -  ?Pacemaker/ICD device last checked: ?Pacemaker orders received: ?Device Rep notified: ? ?Spinal Cord Stimulator: ? ?Sleep Study - Yes ?CPAP - NO ? ?Fasting Blood Sugar -  ?Checks Blood Sugar _____ times a day ?Date and result of last Hgb A1c- ? ?Blood Thinner Instructions: Plavix it had been held. ?Aspirin Instructions: ?Last Dose: ? ?Activity level: Can go up a flight of stairs and activities of daily living without stopping and without chest pain and/or shortness of breath ?  Able to exercise without chest pain and/or shortness of breath ?  Unable to go up a flight of stairs without chest pain and/or shortness of breath ?   ? ?Anesthesia review: Hx: CAD,HTN,DIA,CKD III ? ?Patient denies shortness of breath, fever, cough and chest pain at PAT appointment ? ? ?Patient verbalized understanding of instructions that were given to them at the PAT appointment. Patient was also instructed that they will need to review over the PAT instructions again at home before surgery.  ?

## 2021-09-17 NOTE — Progress Notes (Signed)
Anesthesia Chart Review   Case: [id number] Date/Time: 09/18/21 1315   Procedure: SURGICAL STABILIZATION LEFT FEMUR WITH INTRAMEDULLARY (IM) NAIL/ROD (Left)   Anesthesia type: Choice   Pre-op diagnosis: impending pathologic fracture left proximal femur   Location: [facility] ROOM 10 / [facility]   Surgeons: Mcarthur Rossetti, MD       DISCUSSION:80 y.o. former smoker with h/o HTN, CAD, DM II, CKD Stage III, metastatic renal carcinoma, impending pathologic fracture of left proximal femur scheduled for above procedure 09/18/21 with Dr. Jean Rosenthal.   DES in 2010.  Last seen by cardiology in 2012.  Unable to assess functional status as he is using a wheelchair mostly due to hip pain.  Cardiac clearance requested.  VS: There were no vitals taken for this visit.  PROVIDERS: [name], MD is PCP    LABS: Labs reviewed: Acceptable for surgery. 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 There were         no left ventricular regional wall motion abnormalities. Left         ventricular wall thickness was mildly to moderately         increased.   -  There was trivial aortic valvular regurgitation.   -  There was mild aortic root dilatation.   -  There was mild mitral annular calcification.   -  The left atrium was mildly dilated.  Past Medical History:  Diagnosis Date   CAD (coronary artery disease)    Diabetes mellitus without complication ([place])    Dyspnea    Facial nerve disorder, unspecified    HLD (hyperlipidemia)    HTN (hypertension)    Jan 2010 He had a 95% mid LAD lesion. There was 99% stenosis at the distal edge of the stented area in the right coronary artery. The EF was 65%. He had drug-eluting stents placed in both the LAD  and the right coronary artery.   Hypothyroidism    Obesity    Other facial nerve disorders    Renal cell carcinoma of right kidney (HCC)    Syncope    Unspecified ptosis of eyelid     Past Surgical History:  Procedure Laterality Date   CHOLECYSTECTOMY     CHOLECYSTECTOMY     CORONARY STENT PLACEMENT     MENISCECTOMY Right    NEPHRECTOMY RADICAL Right 2012   TONSILLECTOMY      MEDICATIONS:  Accu-Chek FastClix Lancets MISC   acetaminophen (TYLENOL) 325 MG tablet   aspirin 81 MG EC tablet   axitinib (INLYTA) 5 MG tablet   Blood Glucose Monitoring Suppl (ACCU-CHEK AVIVA PLUS) w/Device KIT   Brompheniramine-Pseudoeph (DIMETAPP PO)   Cholecalciferol 50 MCG (2000 UT) TABS   clopidogrel (PLAVIX) 75 MG tablet   Docusate Sodium (COLACE PO)   DULoxetine (CYMBALTA) 30 MG capsule   gabapentin (NEURONTIN) 100 MG capsule   HYDROcodone-acetaminophen (NORCO/VICODIN) 5-325 MG tablet   levothyroxine (SYNTHROID, LEVOTHROID) 200 MCG tablet   metoprolol (TOPROL-XL) 50 MG 24 hr tablet   Multiple Vitamin (MULTIVITAMINS PO)   niacin 500 MG CR capsule   Omega-3 Fatty Acids (FISH OIL PO)   prochlorperazine (COMPAZINE) 10 MG tablet   rOPINIRole (REQUIP) 0.25 MG tablet  rosuvastatin (CRESTOR) 20 MG tablet   sildenafil (VIAGRA) 100 MG tablet   traZODone (DESYREL) 100 MG tablet   valsartan-hydrochlorothiazide (DIOVAN-HCT) 160-12.5 MG per tablet   VICTOZA 18 MG/3ML SOPN   vitamin E 400 UNIT capsule   No current facility-administered medications for this encounter.    Konrad Felix Ward, PA-C [facility] Pre-Surgical Testing [phone]

## 2021-09-17 NOTE — Telephone Encounter (Signed)
? ?  Pre-operative Risk Assessment  ?  ?Patient Name: Thomas Michael  ?DOB: 1940-10-17 ?MRN: [id number]  ? ?  ?PT SEEING DR. [facility] 09/18/21 @ 9:40; SURGERY IS PLANNED FOR 09/22/21  ? ?Request for Surgical Clearance   ? ?Procedure:   SURGICAL STABILIZATION LEFT FEMUR w/INTRAMEDULLARY NAIL ROD ? ?Date of Surgery:  Clearance 09/22/21                            ?   ?Surgeon:  DR. Jean Rosenthal ?Surgeon's Group or Practice Name:  [facility] ?Phone number:  [phone] ?Fax number:  [phone] ATTN: SHERRIE ?  ?Type of Clearance Requested:   ?- Medical  ?- Pharmacy:  Hold Aspirin and Clopidogrel (Plavix) PER DR. [facility] THE PT TOOK HIS LAST DOSE OF PLAVIX ON 09/15/21 ?  ?Type of Anesthesia:   SPINAL vs GENERAL  ?  ?Additional requests/questions:   ? ?Signed, ?Julaine Hua   ?09/17/2021, 3:47 PM  ? ?

## 2021-09-18 ENCOUNTER — Encounter: Payer: Self-pay | Admitting: Internal Medicine

## 2021-09-18 ENCOUNTER — Ambulatory Visit (INDEPENDENT_AMBULATORY_CARE_PROVIDER_SITE_OTHER): Payer: Medicare Other | Admitting: Internal Medicine

## 2021-09-18 ENCOUNTER — Other Ambulatory Visit: Payer: Self-pay

## 2021-09-18 VITALS — BP 97/59 | HR 84 | Ht 66.0 in | Wt 241.2 lb

## 2021-09-18 DIAGNOSIS — Z01810 Encounter for preprocedural cardiovascular examination: Secondary | ICD-10-CM | POA: Diagnosis not present

## 2021-09-18 NOTE — Patient Instructions (Signed)
Medication Instructions:  ?STOP Plavix ? ?*If you need a refill on your cardiac medications before your next appointment, please call your pharmacy* ? ?Testing/Procedures: ?Your physician has requested that you have a lexiscan myoview. For further information please visit [email or web address]. Please follow instruction sheet, as given. This will take place at [address], [address] ? ?How to prepare for your Myocardial Perfusion Test: ?Do not eat or drink 3 hours prior to your test, except you may have water. ?Do not consume products containing caffeine (regular or decaffeinated) 12 hours prior to your test. (ex: coffee, chocolate, sodas, tea). ?Do bring a list of your current medications with you.  If not listed below, you may take your medications as normal. ?Do wear comfortable clothes (no dresses or overalls) and walking shoes, tennis shoes preferred (No heels or open toe shoes are allowed). ?Do NOT wear cologne, perfume, aftershave, or lotions (deodorant is allowed). ?The test will take approximately 3 to 4 hours to complete ?If these instructions are not followed, your test will have to be rescheduled. ? ?Follow-Up: ?At [facility], you and your health needs are our priority.  As part of our continuing mission to provide you with exceptional heart care, we have created designated Provider Care Teams.  These Care Teams include your primary Cardiologist (physician) and Advanced Practice Providers (APPs -  Physician Assistants and Nurse Practitioners) who all work together to provide you with the care you need, when you need it. ? ?We recommend signing up for the patient portal called "MyChart".  Sign up information is provided on this After Visit Summary.  MyChart is used to connect with patients for Virtual Visits (Telemedicine).  Patients are able to view lab/test results, encounter notes, upcoming appointments, etc.  Non-urgent messages can be sent to your provider as well.   ?To learn more about  what you can do with MyChart, go to [email or web address].   ? ?Your next appointment:   ?3 month(s) ? ?The format for your next appointment:   ?In Person ? ?Provider:   ?Dr. Harl Bowie  ? ?

## 2021-09-18 NOTE — Progress Notes (Signed)
Cardiology Office Note:    Date:  09/18/2021   ID:  Thomas Michael, DOB [birth year], MRN [id number]  PCP:  [name], MD   [facility] Providers Cardiologist:  None     Referring MD: [name], MD   No chief complaint on file. Pre-op  History of Present Illness:    Thomas Michael is a 81 y.o. 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 He walks with his walker. He denies chest pain. He can get out of breath easily. He denies LE edema. He denies orthopnea, PND. He sleeps on a recliner to help with back pain. His blood pressure is well controlled  Saw Dr. Percival Spanish in 2012. Saw for pre-op for renal cell cancer in 2010.  Prior to his cath he had a lexiscan. Noted to have ischemic dx history per below. EF was normal. He has no history of heart failure. No cath since.He has not needed to use nitroglycerin.  In terms of his cancer therapy, He has stage IV RCC. he is undergoing immune checkpoint inhibitor therapy with pembrolizumab. Considering axitinib   Cardiology Studies Cath 2010-95% mid-LAD, 99% stenosis of RCA distal to prior stent. He underwent PCI to the LAD and RCA at this time. EF 65%.  Past Medical History:  Diagnosis Date   CAD (coronary artery disease)    Diabetes mellitus without complication ([place])    Dyspnea    Facial nerve disorder, unspecified    HLD (hyperlipidemia)    HTN (hypertension)    Jan 2010 He had a 95% mid LAD lesion. There was 99% stenosis at the distal edge of the stented area in the right coronary artery. The EF was 65%. He had drug-eluting stents placed in both the LAD and the right coronary artery.   Hypothyroidism    Obesity    Other facial nerve disorders    Renal cell carcinoma of right kidney (HCC)    Syncope    Unspecified ptosis of eyelid     Past Surgical History:   Procedure Laterality Date   CHOLECYSTECTOMY     CHOLECYSTECTOMY     CORONARY STENT PLACEMENT     MENISCECTOMY Right    NEPHRECTOMY RADICAL Right 2012   TONSILLECTOMY      Current Medications: Current Outpatient Medications on File Prior to Visit  Medication Sig Dispense Refill   Accu-Chek FastClix Lancets MISC check CBG     acetaminophen (TYLENOL) 325 MG tablet 3 tablet as needed     aspirin 81 MG EC tablet Take 81 mg by mouth daily.     axitinib (INLYTA) 5 MG tablet Take 1 tablet (5 mg total) by mouth daily. 30 tablet 1   Blood Glucose Monitoring Suppl (ACCU-CHEK AVIVA PLUS) w/Device KIT check CBG     Brompheniramine-Pseudoeph (DIMETAPP PO) Take by mouth. As needed for nasal congestion     Cholecalciferol 50 MCG (2000 UT) TABS 1 capsule     Docusate Sodium (COLACE PO) Take by mouth as needed.     DULoxetine (CYMBALTA) 30 MG capsule TAKE 1 CAPSULE BY MOUTH EVERY DAY 90 capsule 0   levothyroxine (SYNTHROID, LEVOTHROID) 200 MCG tablet Take 200 mcg by mouth daily.     metoprolol (TOPROL-XL) 50 MG 24 hr tablet Take 50 mg by mouth daily.     Multiple Vitamin (MULTIVITAMINS PO)  1 tablet     niacin 500 MG CR capsule Take 500 mg by mouth at bedtime.     Omega-3 Fatty Acids (FISH OIL PO) Take 1 tablet by mouth daily.     oxycodone (OXY-IR) 5 MG capsule Take 1 capsule (5 mg total) by mouth every 4 (four) hours as needed. 30 capsule 0   prochlorperazine (COMPAZINE) 10 MG tablet Take 1 tablet (10 mg total) by mouth every 6 (six) hours as needed for nausea or vomiting. 30 tablet 0   rOPINIRole (REQUIP) 0.25 MG tablet TAKE 2 TABLETS ONCE DAILY 1TO 3 HOURS BEFORE BEDTIME     rosuvastatin (CRESTOR) 20 MG tablet Take 20 mg by mouth daily.     sildenafil (VIAGRA) 100 MG tablet Take 100 mg by mouth as needed.     traZODone (DESYREL) 100 MG tablet 200 mg at bedtime.      valsartan-hydrochlorothiazide (DIOVAN-HCT) 160-12.5 MG per tablet      VICTOZA 18 MG/3ML SOPN      vitamin E 400 UNIT capsule Take  400 Units by mouth daily.     No current facility-administered medications on file prior to visit.    Allergies:   Promethazine-codeine   Social History   Socioeconomic History   Marital status: Married    Spouse name: Haynes Dage   Number of children: 4   Years of education: 14   Highest education level: Not on file  Occupational History   Occupation: part time    Employer: [employer]    Comment: Works for himself  Tobacco Use   Smoking status: Former    Packs/day: 2.00    Years: 12.00    Pack years: 24.00    Types: Cigarettes    Quit date: 07/19/1968    Years since quitting: 53.2   Smokeless tobacco: Never  Vaping Use   Vaping Use: Never used  Substance and Sexual Activity   Alcohol use: Yes    Alcohol/week: 2.0 standard drinks    Types: 2 Shots of liquor per week    Comment: rare   Drug use: No   Sexual activity: Not on file  Other Topics Concern   Not on file  Social History Narrative   Patient is self employed - retired. Patient lives at home with his wife Haynes Dage.   Caffeine two cups daily.   Left handed.   College two years.   Social Determinants of Health   Financial Resource Strain: Not on file  Food Insecurity: Not on file  Transportation Needs: Not on file  Physical Activity: Not on file  Stress: Not on file  Social Connections: Not on file     Family History: The patient's family history includes Breast cancer (age of onset: 29) in his sister; Gallbladder disease in his mother; Heart disease in his father; High blood pressure in his sister.  ROS:   Please see the history of present illness.     All other systems reviewed and are negative.  EKGs/Labs/Other Studies Reviewed:    The following studies were reviewed today:   EKG:  EKG is  ordered today.  The ekg ordered today demonstrates   NSR, infrequent PVCs  Recent Labs: 09/08/2021: ALT 14; BUN 21; Creatinine 1.55; Hemoglobin 11.5; Platelet Count 222; Potassium 3.7; Sodium 134; TSH  1.626  Recent Lipid Panel    Component Value Date/Time   CHOL  08/13/2008 0040    131        ATP III CLASSIFICATION:  <200  mg/dL   Desirable  200-239  mg/dL   Borderline High  >=240    mg/dL   High          TRIG 428 (H) 08/13/2008 0040   HDL 28 (L) 08/13/2008 0040   CHOLHDL 4.7 08/13/2008 0040   VLDL UNABLE TO CALCULATE IF TRIGLYCERIDE OVER 400 mg/dL 08/13/2008 0040   LDLCALC  08/13/2008 0040    UNABLE TO CALCULATE IF TRIGLYCERIDE OVER 400 mg/dL        Total Cholesterol/HDL:CHD Risk Coronary Heart Disease Risk Table                     Men   Women  1/2 Average Risk   3.4   3.3  Average Risk       5.0   4.4  2 X Average Risk   9.6   7.1  3 X Average Risk  23.4   11.0        Use the calculated Patient Ratio above and the CHD Risk Table to determine the patient's CHD Risk.        ATP III CLASSIFICATION (LDL):  <100     mg/dL   Optimal  100-129  mg/dL   Near or Above                    Optimal  130-159  mg/dL   Borderline  160-189  mg/dL   High  >190     mg/dL   Very High     Risk Assessment/Calculations:           Physical Exam:    VS:    Vitals:   09/18/21 1002  BP: (!) 97/59  Pulse: 84  SpO2: 96%     Wt Readings from Last 3 Encounters:  09/16/21 239 lb (108.4 kg)  09/08/21 241 lb (109.3 kg)  09/02/21 246 lb 12.8 oz (111.9 kg)     GEN: He is sitting in a wheel chair, well appearing HEENT: Normal NECK: No JVD; No carotid bruits LYMPHATICS: No lymphadenopathy CARDIAC: RRR, no murmurs, rubs, gallops RESPIRATORY:  Clear to auscultation without rales, wheezing or rhonchi  ABDOMEN: non-distended MUSCULOSKELETAL:  No edema; No deformity  SKIN: Warm and dry NEUROLOGIC:  Alert and oriented x 3 PSYCHIATRIC:  Normal affect   ASSESSMENT:    #Pre-op: He is unable to conduct > 4 METs. He does have increased risk with hx of CAD. Will schedule lexiscan stress today. Likely has anterior/inferior scar, can look for significant inducible ischemia. If mild  per-infarct ischemia or no inducible ischemia, he is acceptable cardiac risk for intermediate risk surgery.   Ischemic Heart disease: He is asymptomatic. He was on DAPT. We discussed that since his cath was far out and he did not have residual coronary dx, he can continue aspirin and stop plavix. He can continue BB - holding aspirin - holding  crestor 20 mg daily - stop plavix -continue metop 50 mg daily  #CardioOnc- Immune check point inhibitors are associated with myocarditis. Incidence < 2%. Signs can be , signs of heart failure or new arrhythmia. He is not high risk ( not on dual therapy, no ICI induced non cardiac events). Since he is low risk baseline ECG is recommended. Baseline troponins are recommended but this is controversial. (ESC Guidelines Cardio-Onc 2022). Anti-VEGF TKI can be associated with hypertension. His BP is well controlled, can continue to monitor.  PLAN:    In order of problems listed above:  Lexiscan SPECT Stop plavix Follow up in 3 months      Shared Decision Making/Informed Consent The risks [chest pain, shortness of breath, cardiac arrhythmias, dizziness, blood pressure fluctuations, myocardial infarction, stroke/transient ischemic attack, nausea, vomiting, allergic reaction, radiation exposure, metallic taste sensation and life-threatening complications (estimated to be 1 in 10,000)], benefits (risk stratification, diagnosing coronary artery disease, treatment guidance) and alternatives of a nuclear stress test were discussed in detail with Mr. Krach and he agrees to proceed.    Medication Adjustments/Labs and Tests Ordered: Current medicines are reviewed at length with the patient today.  Concerns regarding medicines are outlined above.  No orders of the defined types were placed in this encounter.  No orders of the defined types were placed in this encounter.   There are no Patient Instructions on file for this visit.   Signed, Janina Mayo, MD   09/18/2021 8:21 AM    [facility]

## 2021-09-18 NOTE — Addendum Note (Signed)
Addended by: Patria Mane A on: 09/18/2021 12:17 PM ? ? Modules accepted: Orders ? ?

## 2021-09-21 ENCOUNTER — Other Ambulatory Visit (HOSPITAL_COMMUNITY): Payer: Self-pay

## 2021-09-21 ENCOUNTER — Encounter: Payer: Self-pay | Admitting: Internal Medicine

## 2021-09-21 NOTE — Telephone Encounter (Signed)
I will forward this note from Dr. Harl Bowie to requesting office the pt will need a stress test before he can be cleared. Per Dr. Harl Bowie the pt's procedure will need to be rescheduled.  ?

## 2021-09-21 NOTE — Telephone Encounter (Signed)
Please advise 

## 2021-09-21 NOTE — Telephone Encounter (Signed)
I spoke with patient today and confirmed procedure cancelled for tomorrow.  Stress test will be Wednesday morning.  I advised you would call to discuss how to proceed. ?

## 2021-09-22 ENCOUNTER — Telehealth (HOSPITAL_COMMUNITY): Payer: Self-pay | Admitting: *Deleted

## 2021-09-22 ENCOUNTER — Encounter (HOSPITAL_COMMUNITY): Admission: RE | Payer: Self-pay | Source: Home / Self Care

## 2021-09-22 ENCOUNTER — Inpatient Hospital Stay (HOSPITAL_COMMUNITY): Admission: RE | Admit: 2021-09-22 | Payer: Medicare Other | Source: Home / Self Care | Admitting: Orthopaedic Surgery

## 2021-09-22 SURGERY — INSERTION, INTRAMEDULLARY ROD, FEMUR
Anesthesia: Choice | Laterality: Left

## 2021-09-22 NOTE — Telephone Encounter (Signed)
Close encounter 

## 2021-09-23 ENCOUNTER — Other Ambulatory Visit: Payer: Self-pay

## 2021-09-23 ENCOUNTER — Ambulatory Visit (HOSPITAL_COMMUNITY)
Admission: RE | Admit: 2021-09-23 | Discharge: 2021-09-23 | Disposition: A | Discharge status: Home / Self Care | Payer: Medicare Other | Source: Ambulatory Visit | Attending: Cardiology | Admitting: Cardiology

## 2021-09-23 DIAGNOSIS — Z01810 Encounter for preprocedural cardiovascular examination: Secondary | ICD-10-CM | POA: Diagnosis not present

## 2021-09-23 LAB — MYOCARDIAL PERFUSION IMAGING
LV dias vol: 121 mL (ref 62–150)
LV sys vol: 55 mL
Nuc Stress EF: 55 %
Peak HR: 77 {beats}/min
Rest HR: 71 {beats}/min
Rest Nuclear Isotope Dose: 11 mCi
SDS: 3
SRS: 0
SSS: 3
ST Depression (mm): 0 mm
Stress Nuclear Isotope Dose: 31.8 mCi
TID: 1.05

## 2021-09-23 MED ORDER — REGADENOSON 0.4 MG/5ML IV SOLN
0.4000 mg | Freq: Once | INTRAVENOUS | Status: AC
  Administered 2021-09-23: 0.4 mg via INTRAVENOUS

## 2021-09-23 MED ORDER — TECHNETIUM TC 99M TETROFOSMIN IV KIT
11.0000 | PACK | Freq: Once | INTRAVENOUS | Status: AC | PRN
  Administered 2021-09-23: 11 via INTRAVENOUS
  Filled 2021-09-23: qty 11

## 2021-09-23 MED ORDER — TECHNETIUM TC 99M TETROFOSMIN IV KIT
31.8000 | PACK | Freq: Once | INTRAVENOUS | Status: AC | PRN
  Administered 2021-09-23: 31.8 via INTRAVENOUS
  Filled 2021-09-23: qty 32

## 2021-09-24 NOTE — Progress Notes (Signed)
I got a pre-op testing coding query on this pt. I get orders routed to me as the [facility] for [facility] for COVID testing. I do not put in other pre-op orders. They should not be assigned to me! thanks! ?

## 2021-09-25 NOTE — Addendum Note (Signed)
Addended byPhineas Inches on: 09/25/2021 11:50 AM ? ? Modules accepted: Orders ? ?

## 2021-09-29 ENCOUNTER — Inpatient Hospital Stay: Payer: Medicare Other | Attending: Physician Assistant

## 2021-09-29 ENCOUNTER — Other Ambulatory Visit: Payer: Self-pay

## 2021-09-29 ENCOUNTER — Inpatient Hospital Stay (HOSPITAL_BASED_OUTPATIENT_CLINIC_OR_DEPARTMENT_OTHER): Payer: Medicare Other | Admitting: Oncology

## 2021-09-29 ENCOUNTER — Inpatient Hospital Stay: Payer: Medicare Other

## 2021-09-29 VITALS — BP 110/65 | HR 63 | Temp 98.1°F | Resp 17 | Ht 66.0 in | Wt 238.6 lb

## 2021-09-29 DIAGNOSIS — Z20822 Contact with and (suspected) exposure to covid-19: Secondary | ICD-10-CM | POA: Diagnosis present

## 2021-09-29 DIAGNOSIS — E559 Vitamin D deficiency, unspecified: Secondary | ICD-10-CM | POA: Diagnosis not present

## 2021-09-29 DIAGNOSIS — I25119 Atherosclerotic heart disease of native coronary artery with unspecified angina pectoris: Secondary | ICD-10-CM | POA: Diagnosis not present

## 2021-09-29 DIAGNOSIS — M84552A Pathological fracture in neoplastic disease, left femur, initial encounter for fracture: Secondary | ICD-10-CM | POA: Diagnosis present

## 2021-09-29 DIAGNOSIS — I10 Essential (primary) hypertension: Secondary | ICD-10-CM | POA: Diagnosis not present

## 2021-09-29 DIAGNOSIS — Z905 Acquired absence of kidney: Secondary | ICD-10-CM | POA: Diagnosis not present

## 2021-09-29 DIAGNOSIS — Z7982 Long term (current) use of aspirin: Secondary | ICD-10-CM | POA: Diagnosis not present

## 2021-09-29 DIAGNOSIS — D62 Acute posthemorrhagic anemia: Secondary | ICD-10-CM | POA: Diagnosis present

## 2021-09-29 DIAGNOSIS — M129 Arthropathy, unspecified: Secondary | ICD-10-CM | POA: Diagnosis not present

## 2021-09-29 DIAGNOSIS — E785 Hyperlipidemia, unspecified: Secondary | ICD-10-CM | POA: Diagnosis present

## 2021-09-29 DIAGNOSIS — E032 Hypothyroidism due to medicaments and other exogenous substances: Secondary | ICD-10-CM

## 2021-09-29 DIAGNOSIS — Z803 Family history of malignant neoplasm of breast: Secondary | ICD-10-CM | POA: Diagnosis not present

## 2021-09-29 DIAGNOSIS — Z1159 Encounter for screening for other viral diseases: Secondary | ICD-10-CM | POA: Diagnosis not present

## 2021-09-29 DIAGNOSIS — Z79899 Other long term (current) drug therapy: Secondary | ICD-10-CM | POA: Diagnosis not present

## 2021-09-29 DIAGNOSIS — M84452A Pathological fracture, left femur, initial encounter for fracture: Secondary | ICD-10-CM | POA: Diagnosis not present

## 2021-09-29 DIAGNOSIS — C649 Malignant neoplasm of unspecified kidney, except renal pelvis: Secondary | ICD-10-CM

## 2021-09-29 DIAGNOSIS — E039 Hypothyroidism, unspecified: Secondary | ICD-10-CM | POA: Diagnosis not present

## 2021-09-29 DIAGNOSIS — Z85528 Personal history of other malignant neoplasm of kidney: Secondary | ICD-10-CM | POA: Diagnosis not present

## 2021-09-29 DIAGNOSIS — Z6838 Body mass index (BMI) 38.0-38.9, adult: Secondary | ICD-10-CM | POA: Diagnosis not present

## 2021-09-29 DIAGNOSIS — C641 Malignant neoplasm of right kidney, except renal pelvis: Secondary | ICD-10-CM | POA: Diagnosis not present

## 2021-09-29 DIAGNOSIS — Z7902 Long term (current) use of antithrombotics/antiplatelets: Secondary | ICD-10-CM | POA: Diagnosis not present

## 2021-09-29 DIAGNOSIS — E119 Type 2 diabetes mellitus without complications: Secondary | ICD-10-CM | POA: Diagnosis not present

## 2021-09-29 DIAGNOSIS — Z79890 Hormone replacement therapy (postmenopausal): Secondary | ICD-10-CM | POA: Diagnosis not present

## 2021-09-29 DIAGNOSIS — C7951 Secondary malignant neoplasm of bone: Secondary | ICD-10-CM | POA: Diagnosis present

## 2021-09-29 DIAGNOSIS — I2510 Atherosclerotic heart disease of native coronary artery without angina pectoris: Secondary | ICD-10-CM | POA: Diagnosis present

## 2021-09-29 DIAGNOSIS — Z87891 Personal history of nicotine dependence: Secondary | ICD-10-CM | POA: Diagnosis not present

## 2021-09-29 DIAGNOSIS — M898X5 Other specified disorders of bone, thigh: Secondary | ICD-10-CM | POA: Diagnosis not present

## 2021-09-29 DIAGNOSIS — Z8249 Family history of ischemic heart disease and other diseases of the circulatory system: Secondary | ICD-10-CM | POA: Diagnosis not present

## 2021-09-29 DIAGNOSIS — Z955 Presence of coronary angioplasty implant and graft: Secondary | ICD-10-CM | POA: Diagnosis not present

## 2021-09-29 LAB — CBC WITH DIFFERENTIAL (CANCER CENTER ONLY)
Abs Immature Granulocytes: 0.03 10*3/uL (ref 0.00–0.07)
Basophils Absolute: 0.1 10*3/uL (ref 0.0–0.1)
Basophils Relative: 1 %
Eosinophils Absolute: 0.2 10*3/uL (ref 0.0–0.5)
Eosinophils Relative: 3 %
HCT: 33.6 % — ABNORMAL LOW (ref 39.0–52.0)
Hemoglobin: 11.2 g/dL — ABNORMAL LOW (ref 13.0–17.0)
Immature Granulocytes: 0 %
Lymphocytes Relative: 17 %
Lymphs Abs: 1.2 10*3/uL (ref 0.7–4.0)
MCH: 29.6 pg (ref 26.0–34.0)
MCHC: 33.3 g/dL (ref 30.0–36.0)
MCV: 88.9 fL (ref 80.0–100.0)
Monocytes Absolute: 0.7 10*3/uL (ref 0.1–1.0)
Monocytes Relative: 10 %
Neutro Abs: 5 10*3/uL (ref 1.7–7.7)
Neutrophils Relative %: 69 %
Platelet Count: 268 10*3/uL (ref 150–400)
RBC: 3.78 MIL/uL — ABNORMAL LOW (ref 4.22–5.81)
RDW: 12.7 % (ref 11.5–15.5)
WBC Count: 7.2 10*3/uL (ref 4.0–10.5)
nRBC: 0 % (ref 0.0–0.2)

## 2021-09-29 LAB — CMP (CANCER CENTER ONLY)
ALT: 11 U/L (ref 0–44)
AST: 11 U/L — ABNORMAL LOW (ref 15–41)
Albumin: 4.3 g/dL (ref 3.5–5.0)
Alkaline Phosphatase: 64 U/L (ref 38–126)
Anion gap: 10 (ref 5–15)
BUN: 20 mg/dL (ref 8–23)
CO2: 24 mmol/L (ref 22–32)
Calcium: 9.7 mg/dL (ref 8.9–10.3)
Chloride: 98 mmol/L (ref 98–111)
Creatinine: 1.49 mg/dL — ABNORMAL HIGH (ref 0.61–1.24)
GFR, Estimated: 47 mL/min — ABNORMAL LOW (ref >60)
Glucose, Bld: 146 mg/dL — ABNORMAL HIGH (ref 70–99)
Potassium: 3.7 mmol/L (ref 3.5–5.1)
Sodium: 132 mmol/L — ABNORMAL LOW (ref 135–145)
Total Bilirubin: 0.4 mg/dL (ref 0.3–1.2)
Total Protein: 7.5 g/dL (ref 6.5–8.1)

## 2021-09-29 LAB — TSH: TSH: 0.405 u[IU]/mL (ref 0.320–4.118)

## 2021-09-29 MED ORDER — SODIUM CHLORIDE 0.9 % IV SOLN: Freq: Once | INTRAVENOUS | Status: AC

## 2021-09-29 MED ORDER — SODIUM CHLORIDE 0.9 % IV SOLN
200.0000 mg | Freq: Once | INTRAVENOUS | Status: AC
  Administered 2021-09-29: 200 mg via INTRAVENOUS
  Filled 2021-09-29: qty 200

## 2021-09-29 NOTE — Progress Notes (Signed)
Hematology and Oncology Follow Up Visit ? ?Thomas Michael ?[id number] ?Dec 04, 1940 81 y.o. ?09/29/2021 8:47 AM ?[name], MDWolters, [name], MD  ? 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  He initially presented with stage Ib tumor in 2012. ? ? ?Prior Therapy: ? ?He is status post right radical nephrectomy in May 2012.  He was found to have stage Ib tumor. ? ?Current therapy: Pembrolizumab 200 mg every 3 weeks started on September 08, 2021. The addition to Inlyta 5 mg daily has been postponed to October 12, 2021 after recovery from his surgery. ? ?Interim History: Mr. Van returns today for a follow-up visit.  Since the last visit, he completed the first cycle of [place] without any complaints.  He denies any nausea, vomiting or abdominal pain.  He denies any falls or syncope.  He denies any skin rashes or lesions.  His performance status and quality of life remained stable.  His left hip pain is manageable with hydrocodone and has reported some occasional constipation. ? ? ? ? ?Medications: I have reviewed the patient's current medications.  ?Current Outpatient Medications  ?Medication Sig Dispense Refill  ? Accu-Chek FastClix Lancets MISC check CBG    ? acetaminophen (TYLENOL) 325 MG tablet Take 650 mg by mouth every 6 (six) hours as needed (pain.).    ? aspirin 81 MG EC tablet Take 81 mg by mouth daily.    ? axitinib (INLYTA) 5 MG tablet Take 1 tablet (5 mg total) by mouth daily. 30 tablet 1  ? Blood Glucose Monitoring Suppl (ACCU-CHEK AVIVA PLUS) w/Device KIT check CBG    ? Brompheniramine-Pseudoeph (DIMETAPP PO) Take 20 mLs by mouth every 4 (four) hours as needed (nasal congestion.).    ? Cholecalciferol 50 MCG (2000 UT) TABS Take 2,000 Units by mouth daily at 12 noon.    ? ciclopirox (PENLAC) 8 % solution Apply 1 application topically daily as needed (fungus (toes)). Apply over nail and surrounding skin.  Apply daily as needed over previous coat.    ? clopidogrel (PLAVIX) 75 MG tablet Take 75 mg by mouth in the morning.    ? diphenhydrAMINE (BENADRYL) 25 MG tablet Take 50 mg by mouth at bedtime.    ? docusate sodium (COLACE) 100 MG capsule Take 200-300 mg by mouth daily.    ? guaifenesin (HUMIBID E) 400 MG TABS tablet Take 800 mg by mouth every 6 (six) hours as needed (congestion.).    ? HYDROcodone-acetaminophen (NORCO/VICODIN) 5-325 MG tablet Take 1 tablet by mouth every 6 (six) hours as needed (pain.).    ? levothyroxine (SYNTHROID, LEVOTHROID) 200 MCG tablet Take 200 mcg by mouth every evening.    ? metoprolol (TOPROL-XL) 50 MG 24 hr tablet Take 50 mg by mouth in the morning.    ? Multiple Vitamin (MULTIVITAMIN WITH MINERALS) TABS tablet Take 1 tablet by mouth daily at 12 noon.    ? niacin 500 MG CR capsule Take 500 mg by mouth daily at 12 noon.    ? Omega-3 Fatty Acids (FISH OIL PO) Take 1 capsule by mouth daily at 12 noon.    ? oxycodone (OXY-IR) 5 MG capsule Take 1 capsule (5 mg total) by mouth every 4 (four) hours as needed. 30 capsule 0  ? prochlorperazine (COMPAZINE) 10 MG tablet Take 1 tablet (10 mg total) by mouth every 6 (six) hours as needed for nausea or vomiting. 30 tablet 0  ? rOPINIRole (REQUIP) 0.25  MG tablet Take 0.75 mg by mouth at bedtime.    ? rosuvastatin (CRESTOR) 20 MG tablet Take 20 mg by mouth in the morning.    ? sildenafil (VIAGRA) 100 MG tablet Take 100 mg by mouth daily as needed for erectile dysfunction.    ? trazodone (DESYREL) 300 MG tablet Take 300 mg by mouth at bedtime.    ? valsartan-hydrochlorothiazide (DIOVAN-HCT) 160-12.5 MG per tablet Take 1 tablet by mouth in the morning.    ? VICTOZA 18 MG/3ML SOPN Inject 1.2 mg into the skin every evening. (1700)    ? vitamin E 400 UNIT capsule Take 400 Units by mouth daily at 12 noon.    ? ?No current facility-administered medications for this visit.  ? ? ? ?Allergies:  ?Allergies  ?Allergen Reactions  ? Promethazine-Codeine Itching   ? ? ? ?Physical Exam: ?Blood pressure 110/65, pulse 63, temperature 98.1 ?F (36.7 ?C), temperature source Temporal, resp. rate 17, height _0  (1.676 m), weight 238 lb 9.6 oz (108.2 kg), SpO2 98 %. ? ?ECOG: 1 ? ? ? ?General appearance: Comfortable appearing without any discomfort ?Head: Normocephalic without any trauma ?Oropharynx: Mucous membranes are moist and pink without any thrush or ulcers. ?Eyes: Pupils are equal and round reactive to light. ?Lymph nodes: No cervical, supraclavicular, inguinal or axillary lymphadenopathy.   ?Heart:regular rate and rhythm.  S1 and S2 without leg edema. ?Lung: Clear without any rhonchi or wheezes.  No dullness to percussion. ?Abdomin: Soft, nontender, nondistended with good bowel sounds.  No hepatosplenomegaly. ?Musculoskeletal: No joint deformity or effusion.  Full range of motion noted. ?Neurological: No deficits noted on motor, sensory and deep tendon reflex exam. ?Skin: No petechial rash or dryness.  Appeared moist.  ? ? ? ?Lab Results: ?Lab Results  ?Component Value Date  ? WBC 8.5 09/08/2021  ? HGB 11.5 (L) 09/08/2021  ? HCT 35.7 (L) 09/08/2021  ? MCV 90.8 09/08/2021  ? PLT 222 09/08/2021  ? ?  Chemistry   ?   ?Component Value Date/Time  ? NA 134 (L) 09/08/2021 3662  ? K 3.7 09/08/2021 0832  ? CL 102 09/08/2021 0832  ? CO2 21 (L) 09/08/2021 9476  ? BUN 21 09/08/2021 0832  ? CREATININE 1.55 (H) 09/08/2021 5465  ?    ?Component Value Date/Time  ? CALCIUM 9.2 09/08/2021 0832  ? ALKPHOS 56 09/08/2021 0832  ? AST 14 (L) 09/08/2021 0354  ? ALT 14 09/08/2021 0832  ? BILITOT 0.7 09/08/2021 6568  ?  ? ? ? ? ? ?Impression and Plan: ? ? ?81 year old with: ? ?1.  Stage IV intermediate risk clear-cell renal cell carcinoma documented in January 2023.  He has documented disease to be bone, pulmonary involvement and lymphadenopathy. ?  ?He is currently receiving pembrolizumab and completed the first cycle.  Risks and benefits of proceeding with cycle 2 of therapy were reiterated.   Complications include nausea, fatigue, bleeding as well as immune mediated issues.  The plan is to add Inlyta at 5 mg daily on October 12, 2021. ?  ?2.  Left hip and femur bone involvement: He is scheduled for surgical fixation in the near future.  I see no objections to proceeding with surgery at this time. ? ?3.  Prognosis and goals of care: Any treatment is palliative at this time and his disease is incurable.  His performance status remains adequate and aggressive measures are warranted. ? ?4.  Immune mediated complications: We will continue to educate him about these issues including pneumonitis,  colitis and thyroid disease. ?  ?5.  Follow-up: In 3 weeks for the next cycle of therapy. ?  ?30  minutes were spent on this encounter.  The time was dedicated to reviewing laboratory data, disease status update and outlining future plan of care discussion. ?  ? ? ?Zola Button, MD ?3/14/20238:47 AM ? ?

## 2021-09-29 NOTE — Patient Instructions (Addendum)
[facility]   ?Happy Birthday! ?Discharge Instructions: ?Thank you for choosing [facility] to provide your oncology and hematology care.  ? ?If you have a lab appointment with the [facility], please go directly to the [facility] and check in at the registration area. ?  ?Wear comfortable clothing and clothing appropriate for easy access to any Portacath or PICC line.  ? ?We strive to give you quality time with your provider. You may need to reschedule your appointment if you arrive late (15 or more minutes).  Arriving late affects you and other patients whose appointments are after yours.  Also, if you miss three or more appointments without notifying the office, you may be dismissed from the clinic at the provider?s discretion.    ?  ?For prescription refill requests, have your pharmacy contact our office and allow 72 hours for refills to be completed.   ? ?Today you received the following chemotherapy and/or immunotherapy agents: Pembrolizumab Thomas Michael)    ?  ?To help prevent nausea and vomiting after your treatment, we encourage you to take your nausea medication as directed. ? ?BELOW ARE SYMPTOMS THAT SHOULD BE REPORTED IMMEDIATELY: ?*FEVER GREATER THAN 100.4 F (38 ?C) OR HIGHER ?*CHILLS OR SWEATING ?*NAUSEA AND VOMITING THAT IS NOT CONTROLLED WITH YOUR NAUSEA MEDICATION ?*UNUSUAL SHORTNESS OF BREATH ?*UNUSUAL BRUISING OR BLEEDING ?*URINARY PROBLEMS (pain or burning when urinating, or frequent urination) ?*BOWEL PROBLEMS (unusual diarrhea, constipation, pain near the anus) ?TENDERNESS IN MOUTH AND THROAT WITH OR WITHOUT PRESENCE OF ULCERS (sore throat, sores in mouth, or a toothache) ?UNUSUAL RASH, SWELLING OR PAIN  ?UNUSUAL VAGINAL DISCHARGE OR ITCHING  ? ?Items with * indicate a potential emergency and should be followed up as soon as possible or go to the Emergency Department if any problems should occur. ? ?Please show the CHEMOTHERAPY ALERT CARD or  IMMUNOTHERAPY ALERT CARD at check-in to the Emergency Department and triage nurse. ? ?Should you have questions after your visit or need to cancel or reschedule your appointment, please contact [facility]  Dept: [phone]  and follow the prompts.  Office hours are 8:00 a.m. to 4:30 p.m. Monday - Friday. Please note that voicemails left after 4:00 p.m. may not be returned until the following business day.  We are closed weekends and major holidays. You have access to a nurse at all times for urgent questions. Please call the main number to the clinic Dept: [phone] and follow the prompts. ? ? ?For any non-urgent questions, you may also contact your provider using MyChart. We now offer e-Visits for anyone 66 and older to request care online for non-urgent symptoms. For details visit [email or web address]. ?  ?Also download the MyChart app! Go to the app store, search "MyChart", open the app, select [facility], and log in with your MyChart username and password. ? ?Due to Covid, a mask is required upon entering the hospital/clinic. If you do not have a mask, one will be given to you upon arrival. For doctor visits, patients may have 1 support person aged 46 or older with them. For treatment visits, patients cannot have anyone with them due to current Covid guidelines and our immunocompromised population.  ? ?

## 2021-09-30 MED ORDER — TRANEXAMIC ACID 1000 MG/10ML IV SOLN
2000.0000 mg | INTRAVENOUS | Status: AC
  Filled 2021-09-30: qty 20

## 2021-10-01 ENCOUNTER — Encounter (HOSPITAL_COMMUNITY)
Admission: RE | Disposition: A | Discharge status: Home Health | Payer: Self-pay | Source: Home / Self Care | Attending: Orthopaedic Surgery

## 2021-10-01 ENCOUNTER — Inpatient Hospital Stay (HOSPITAL_COMMUNITY): Payer: Medicare Other | Admitting: Certified Registered Nurse Anesthetist

## 2021-10-01 ENCOUNTER — Encounter (HOSPITAL_COMMUNITY): Payer: Self-pay | Admitting: Orthopaedic Surgery

## 2021-10-01 ENCOUNTER — Inpatient Hospital Stay (HOSPITAL_COMMUNITY): Payer: Medicare Other

## 2021-10-01 ENCOUNTER — Other Ambulatory Visit: Payer: Self-pay

## 2021-10-01 ENCOUNTER — Inpatient Hospital Stay (HOSPITAL_COMMUNITY)
Admission: RE | Admit: 2021-10-01 | Discharge: 2021-10-02 | DRG: 481 | Disposition: A | Discharge status: Home Health | Payer: Medicare Other | Attending: Orthopaedic Surgery | Admitting: Orthopaedic Surgery

## 2021-10-01 DIAGNOSIS — M898X5 Other specified disorders of bone, thigh: Secondary | ICD-10-CM | POA: Diagnosis not present

## 2021-10-01 DIAGNOSIS — Z20822 Contact with and (suspected) exposure to covid-19: Secondary | ICD-10-CM | POA: Diagnosis present

## 2021-10-01 DIAGNOSIS — C7951 Secondary malignant neoplasm of bone: Secondary | ICD-10-CM | POA: Diagnosis present

## 2021-10-01 DIAGNOSIS — E039 Hypothyroidism, unspecified: Secondary | ICD-10-CM | POA: Diagnosis present

## 2021-10-01 DIAGNOSIS — E119 Type 2 diabetes mellitus without complications: Secondary | ICD-10-CM | POA: Diagnosis present

## 2021-10-01 DIAGNOSIS — Z419 Encounter for procedure for purposes other than remedying health state, unspecified: Secondary | ICD-10-CM

## 2021-10-01 DIAGNOSIS — Z7902 Long term (current) use of antithrombotics/antiplatelets: Secondary | ICD-10-CM | POA: Diagnosis not present

## 2021-10-01 DIAGNOSIS — Z87891 Personal history of nicotine dependence: Secondary | ICD-10-CM | POA: Diagnosis not present

## 2021-10-01 DIAGNOSIS — I25119 Atherosclerotic heart disease of native coronary artery with unspecified angina pectoris: Secondary | ICD-10-CM | POA: Diagnosis not present

## 2021-10-01 DIAGNOSIS — Z905 Acquired absence of kidney: Secondary | ICD-10-CM

## 2021-10-01 DIAGNOSIS — E785 Hyperlipidemia, unspecified: Secondary | ICD-10-CM | POA: Diagnosis present

## 2021-10-01 DIAGNOSIS — Z79890 Hormone replacement therapy (postmenopausal): Secondary | ICD-10-CM | POA: Diagnosis not present

## 2021-10-01 DIAGNOSIS — I10 Essential (primary) hypertension: Secondary | ICD-10-CM

## 2021-10-01 DIAGNOSIS — Z803 Family history of malignant neoplasm of breast: Secondary | ICD-10-CM

## 2021-10-01 DIAGNOSIS — Z955 Presence of coronary angioplasty implant and graft: Secondary | ICD-10-CM | POA: Diagnosis not present

## 2021-10-01 DIAGNOSIS — Z8249 Family history of ischemic heart disease and other diseases of the circulatory system: Secondary | ICD-10-CM

## 2021-10-01 DIAGNOSIS — D62 Acute posthemorrhagic anemia: Secondary | ICD-10-CM | POA: Diagnosis present

## 2021-10-01 DIAGNOSIS — Z6838 Body mass index (BMI) 38.0-38.9, adult: Secondary | ICD-10-CM

## 2021-10-01 DIAGNOSIS — M84452A Pathological fracture, left femur, initial encounter for fracture: Secondary | ICD-10-CM

## 2021-10-01 DIAGNOSIS — Z85528 Personal history of other malignant neoplasm of kidney: Secondary | ICD-10-CM | POA: Diagnosis not present

## 2021-10-01 DIAGNOSIS — Z79899 Other long term (current) drug therapy: Secondary | ICD-10-CM | POA: Diagnosis not present

## 2021-10-01 DIAGNOSIS — I2510 Atherosclerotic heart disease of native coronary artery without angina pectoris: Secondary | ICD-10-CM | POA: Diagnosis present

## 2021-10-01 DIAGNOSIS — M84552A Pathological fracture in neoplastic disease, left femur, initial encounter for fracture: Secondary | ICD-10-CM | POA: Diagnosis present

## 2021-10-01 DIAGNOSIS — Z7982 Long term (current) use of aspirin: Secondary | ICD-10-CM | POA: Diagnosis not present

## 2021-10-01 DIAGNOSIS — M899 Disorder of bone, unspecified: Principal | ICD-10-CM

## 2021-10-01 HISTORY — PX: FEMUR IM NAIL: SHX1597

## 2021-10-01 LAB — GLUCOSE, CAPILLARY
Glucose-Capillary: 132 mg/dL — ABNORMAL HIGH (ref 70–99)
Glucose-Capillary: 108 mg/dL — ABNORMAL HIGH (ref 70–99)
Glucose-Capillary: 101 mg/dL — ABNORMAL HIGH (ref 70–99)
Glucose-Capillary: 94 mg/dL (ref 70–99)
Glucose-Capillary: 124 mg/dL — ABNORMAL HIGH (ref 70–99)

## 2021-10-01 SURGERY — INSERTION, INTRAMEDULLARY ROD, FEMUR
Anesthesia: Monitor Anesthesia Care | Site: Hip | Laterality: Left

## 2021-10-01 MED ORDER — TRANEXAMIC ACID-NACL 1000-0.7 MG/100ML-% IV SOLN
INTRAVENOUS | Status: AC
  Filled 2021-10-01: qty 100

## 2021-10-01 MED ORDER — LEVOTHYROXINE SODIUM 100 MCG PO TABS
200.0000 ug | ORAL_TABLET | Freq: Every evening | ORAL | Status: DC
  Administered 2021-10-01 – 2021-10-02 (×2): 200 ug via ORAL
  Filled 2021-10-01 (×2): qty 2

## 2021-10-01 MED ORDER — POVIDONE-IODINE 10 % EX SWAB
2.0000 "application " | Freq: Once | CUTANEOUS | Status: AC
  Administered 2021-10-01: 2 via TOPICAL

## 2021-10-01 MED ORDER — TRANEXAMIC ACID-NACL 1000-0.7 MG/100ML-% IV SOLN
1000.0000 mg | INTRAVENOUS | Status: AC
  Administered 2021-10-01: 1000 mg via INTRAVENOUS

## 2021-10-01 MED ORDER — ONDANSETRON HCL 4 MG/2ML IJ SOLN: 4.0000 mg | Freq: Four times a day (QID) | INTRAMUSCULAR | Status: DC | PRN

## 2021-10-01 MED ORDER — ROPINIROLE HCL 0.5 MG PO TABS: 0.7500 mg | ORAL_TABLET | Freq: Every day | ORAL | Status: DC

## 2021-10-01 MED ORDER — PROPOFOL 10 MG/ML IV BOLUS
INTRAVENOUS | Status: AC
  Filled 2021-10-01: qty 20

## 2021-10-01 MED ORDER — CLOPIDOGREL BISULFATE 75 MG PO TABS
75.0000 mg | ORAL_TABLET | Freq: Every morning | ORAL | Status: DC
  Administered 2021-10-02: 75 mg via ORAL
  Filled 2021-10-01: qty 1

## 2021-10-01 MED ORDER — PHENYLEPHRINE HCL-NACL 20-0.9 MG/250ML-% IV SOLN
INTRAVENOUS | Status: DC | PRN
  Administered 2021-10-01: 50 ug/min via INTRAVENOUS

## 2021-10-01 MED ORDER — HYDROCHLOROTHIAZIDE 12.5 MG PO TABS
12.5000 mg | ORAL_TABLET | Freq: Every day | ORAL | Status: DC
  Administered 2021-10-02: 12.5 mg via ORAL
  Filled 2021-10-01: qty 1

## 2021-10-01 MED ORDER — ONDANSETRON HCL 4 MG PO TABS: 4.0000 mg | ORAL_TABLET | Freq: Four times a day (QID) | ORAL | Status: DC | PRN

## 2021-10-01 MED ORDER — HYDROMORPHONE HCL 1 MG/ML IJ SOLN
0.5000 mg | INTRAMUSCULAR | Status: DC | PRN
  Administered 2021-10-01 (×2): 1 mg via INTRAVENOUS
  Filled 2021-10-01 (×2): qty 1

## 2021-10-01 MED ORDER — INSULIN ASPART 100 UNIT/ML IJ SOLN
0.0000 [IU] | Freq: Three times a day (TID) | INTRAMUSCULAR | Status: DC
  Administered 2021-10-02: 3 [IU] via SUBCUTANEOUS

## 2021-10-01 MED ORDER — ONDANSETRON HCL 4 MG/2ML IJ SOLN
INTRAMUSCULAR | Status: DC | PRN
  Administered 2021-10-01: 4 mg via INTRAVENOUS

## 2021-10-01 MED ORDER — INSULIN ASPART 100 UNIT/ML IJ SOLN: 0.0000 [IU] | INTRAMUSCULAR | Status: DC | PRN

## 2021-10-01 MED ORDER — MIDAZOLAM HCL 2 MG/2ML IJ SOLN
INTRAMUSCULAR | Status: AC
  Filled 2021-10-01: qty 2

## 2021-10-01 MED ORDER — CEFAZOLIN SODIUM-DEXTROSE 2-4 GM/100ML-% IV SOLN
2.0000 g | Freq: Three times a day (TID) | INTRAVENOUS | Status: AC
  Administered 2021-10-01 – 2021-10-02 (×2): 2 g via INTRAVENOUS
  Filled 2021-10-01 (×2): qty 100

## 2021-10-01 MED ORDER — OXYCODONE HCL 5 MG PO TABS
5.0000 mg | ORAL_TABLET | ORAL | Status: DC | PRN
  Filled 2021-10-01: qty 1

## 2021-10-01 MED ORDER — TRANEXAMIC ACID-NACL 1000-0.7 MG/100ML-% IV SOLN
1000.0000 mg | Freq: Once | INTRAVENOUS | Status: AC
  Administered 2021-10-01: 1000 mg via INTRAVENOUS
  Filled 2021-10-01: qty 100

## 2021-10-01 MED ORDER — ACETAMINOPHEN 325 MG PO TABS: 325.0000 mg | ORAL_TABLET | Freq: Four times a day (QID) | ORAL | Status: DC | PRN

## 2021-10-01 MED ORDER — TRAZODONE HCL 50 MG PO TABS
150.0000 mg | ORAL_TABLET | Freq: Every evening | ORAL | Status: DC | PRN
  Administered 2021-10-01: 150 mg via ORAL
  Filled 2021-10-01: qty 3

## 2021-10-01 MED ORDER — PROPOFOL 10 MG/ML IV BOLUS
INTRAVENOUS | Status: DC | PRN
  Administered 2021-10-01: 30 mg via INTRAVENOUS
  Administered 2021-10-01 (×2): 25 mg via INTRAVENOUS

## 2021-10-01 MED ORDER — LACTATED RINGERS IV SOLN
INTRAVENOUS | Status: DC
Start: 2021-10-01 — End: 2021-10-01

## 2021-10-01 MED ORDER — EPHEDRINE SULFATE-NACL 50-0.9 MG/10ML-% IV SOSY
PREFILLED_SYRINGE | INTRAVENOUS | Status: DC | PRN
  Administered 2021-10-01 (×5): 5 mg via INTRAVENOUS

## 2021-10-01 MED ORDER — VALSARTAN-HYDROCHLOROTHIAZIDE 160-12.5 MG PO TABS: 1.0000 | ORAL_TABLET | Freq: Every morning | ORAL | Status: DC

## 2021-10-01 MED ORDER — OXYCODONE HCL 5 MG PO TABS
10.0000 mg | ORAL_TABLET | ORAL | Status: DC | PRN
  Administered 2021-10-01 – 2021-10-02 (×7): 15 mg via ORAL
  Filled 2021-10-01 (×3): qty 3
  Filled 2021-10-01: qty 2
  Filled 2021-10-01 (×4): qty 3

## 2021-10-01 MED ORDER — CHLORHEXIDINE GLUCONATE 0.12 % MT SOLN: 15.0000 mL | Freq: Once | OROMUCOSAL | Status: AC

## 2021-10-01 MED ORDER — CEFAZOLIN SODIUM-DEXTROSE 2-4 GM/100ML-% IV SOLN
2.0000 g | INTRAVENOUS | Status: AC
  Administered 2021-10-01: 2 g via INTRAVENOUS

## 2021-10-01 MED ORDER — SORBITOL 70 % SOLN: 30.0000 mL | Freq: Every day | Status: DC | PRN

## 2021-10-01 MED ORDER — DOCUSATE SODIUM 100 MG PO CAPS
100.0000 mg | ORAL_CAPSULE | Freq: Two times a day (BID) | ORAL | Status: DC
  Administered 2021-10-02: 100 mg via ORAL
  Filled 2021-10-01 (×2): qty 1

## 2021-10-01 MED ORDER — CEFAZOLIN SODIUM-DEXTROSE 2-4 GM/100ML-% IV SOLN
INTRAVENOUS | Status: AC
  Filled 2021-10-01: qty 100

## 2021-10-01 MED ORDER — FENTANYL CITRATE (PF) 250 MCG/5ML IJ SOLN
INTRAMUSCULAR | Status: DC | PRN
  Administered 2021-10-01: 50 ug via INTRAVENOUS

## 2021-10-01 MED ORDER — LACTATED RINGERS IV SOLN: INTRAVENOUS | Status: DC

## 2021-10-01 MED ORDER — PROPOFOL 1000 MG/100ML IV EMUL
INTRAVENOUS | Status: AC
  Filled 2021-10-01: qty 100

## 2021-10-01 MED ORDER — METHOCARBAMOL 1000 MG/10ML IJ SOLN
500.0000 mg | Freq: Four times a day (QID) | INTRAVENOUS | Status: DC | PRN
  Filled 2021-10-01: qty 5

## 2021-10-01 MED ORDER — ACETAMINOPHEN 500 MG PO TABS
1000.0000 mg | ORAL_TABLET | Freq: Four times a day (QID) | ORAL | Status: AC
  Administered 2021-10-01 – 2021-10-02 (×4): 1000 mg via ORAL
  Filled 2021-10-01 (×4): qty 2

## 2021-10-01 MED ORDER — POLYETHYLENE GLYCOL 3350 17 G PO PACK: 17.0000 g | PACK | Freq: Every day | ORAL | Status: DC | PRN

## 2021-10-01 MED ORDER — METOPROLOL SUCCINATE ER 50 MG PO TB24
50.0000 mg | ORAL_TABLET | Freq: Every morning | ORAL | Status: DC
  Administered 2021-10-02: 50 mg via ORAL
  Filled 2021-10-01: qty 1

## 2021-10-01 MED ORDER — FENTANYL CITRATE (PF) 250 MCG/5ML IJ SOLN
INTRAMUSCULAR | Status: AC
  Filled 2021-10-01: qty 5

## 2021-10-01 MED ORDER — SODIUM CHLORIDE 0.9 % IV SOLN: INTRAVENOUS | Status: DC

## 2021-10-01 MED ORDER — FENTANYL CITRATE (PF) 100 MCG/2ML IJ SOLN: 25.0000 ug | INTRAMUSCULAR | Status: DC | PRN

## 2021-10-01 MED ORDER — METOCLOPRAMIDE HCL 5 MG PO TABS: 5.0000 mg | ORAL_TABLET | Freq: Three times a day (TID) | ORAL | Status: DC | PRN

## 2021-10-01 MED ORDER — IRBESARTAN 150 MG PO TABS
150.0000 mg | ORAL_TABLET | Freq: Every day | ORAL | Status: DC
  Administered 2021-10-02: 150 mg via ORAL
  Filled 2021-10-01: qty 1

## 2021-10-01 MED ORDER — 0.9 % SODIUM CHLORIDE (POUR BTL) OPTIME
TOPICAL | Status: DC | PRN
  Administered 2021-10-01: 1000 mL

## 2021-10-01 MED ORDER — CHLORHEXIDINE GLUCONATE 0.12 % MT SOLN
OROMUCOSAL | Status: AC
Start: 2021-10-01 — End: 2021-10-01
  Administered 2021-10-01: 15 mL via OROMUCOSAL
  Filled 2021-10-01: qty 15

## 2021-10-01 MED ORDER — METHOCARBAMOL 500 MG PO TABS
500.0000 mg | ORAL_TABLET | Freq: Four times a day (QID) | ORAL | Status: DC | PRN
Start: 2021-10-01 — End: 2021-10-02
  Administered 2021-10-01 – 2021-10-02 (×3): 500 mg via ORAL
  Filled 2021-10-01 (×3): qty 1

## 2021-10-01 MED ORDER — ORAL CARE MOUTH RINSE: 15.0000 mL | Freq: Once | OROMUCOSAL | Status: AC

## 2021-10-01 MED ORDER — OXYCODONE HCL 5 MG PO TABS: 5.0000 mg | ORAL_TABLET | Freq: Once | ORAL | Status: DC | PRN

## 2021-10-01 MED ORDER — LIDOCAINE 2% (20 MG/ML) 5 ML SYRINGE
INTRAMUSCULAR | Status: DC | PRN
  Administered 2021-10-01: 25 mg via INTRAVENOUS

## 2021-10-01 MED ORDER — DIPHENHYDRAMINE HCL 12.5 MG/5ML PO ELIX: 25.0000 mg | ORAL_SOLUTION | ORAL | Status: DC | PRN

## 2021-10-01 MED ORDER — BUPIVACAINE IN DEXTROSE 0.75-8.25 % IT SOLN
INTRATHECAL | Status: DC | PRN
  Administered 2021-10-01: 2 mL via INTRATHECAL

## 2021-10-01 MED ORDER — OXYCODONE HCL 5 MG/5ML PO SOLN: 5.0000 mg | Freq: Once | ORAL | Status: DC | PRN

## 2021-10-01 MED ORDER — METOCLOPRAMIDE HCL 5 MG/ML IJ SOLN: 5.0000 mg | Freq: Three times a day (TID) | INTRAMUSCULAR | Status: DC | PRN

## 2021-10-01 MED ORDER — TRANEXAMIC ACID 1000 MG/10ML IV SOLN
INTRAVENOUS | Status: DC | PRN
  Administered 2021-10-01: 2000 mg via TOPICAL

## 2021-10-01 MED ORDER — LIRAGLUTIDE 18 MG/3ML ~~LOC~~ SOPN: 1.2000 mg | PEN_INJECTOR | Freq: Every evening | SUBCUTANEOUS | Status: DC

## 2021-10-01 MED ORDER — PROPOFOL 500 MG/50ML IV EMUL
INTRAVENOUS | Status: DC | PRN
  Administered 2021-10-01: 25 ug/kg/min via INTRAVENOUS

## 2021-10-01 MED ORDER — INSULIN ASPART 100 UNIT/ML IJ SOLN: 0.0000 [IU] | Freq: Every day | INTRAMUSCULAR | Status: DC

## 2021-10-01 SURGICAL SUPPLY — 50 items
BAG COUNTER SPONGE SURGICOUNT (BAG) ×3 IMPLANT
BAG SPNG CNTER NS LX DISP (BAG) ×1
BIT DRILL INTERTAN LAG SCREW (BIT) ×1 IMPLANT
BIT DRILL SHORT 4.0 (BIT) IMPLANT
BNDG COHESIVE 4X5 TAN STRL (GAUZE/BANDAGES/DRESSINGS) ×3 IMPLANT
CNTNR URN SCR LID CUP LEK RST (MISCELLANEOUS) IMPLANT
CONT SPEC 4OZ STRL OR WHT (MISCELLANEOUS) ×2
COVER PERINEAL POST (MISCELLANEOUS) ×3 IMPLANT
COVER SURGICAL LIGHT HANDLE (MISCELLANEOUS) ×3 IMPLANT
DRAPE C-ARMOR (DRAPES) ×3 IMPLANT
DRAPE HALF SHEET 40X57 (DRAPES) IMPLANT
DRAPE INCISE IOBAN 66X45 STRL (DRAPES) IMPLANT
DRAPE ORTHO SPLIT 77X108 STRL (DRAPES)
DRAPE STERI IOBAN 125X83 (DRAPES) ×1 IMPLANT
DRAPE SURG ORHT 6 SPLT 77X108 (DRAPES) IMPLANT
DRESSING MEPILEX FLEX 4X4 (GAUZE/BANDAGES/DRESSINGS) IMPLANT
DRILL BIT SHORT 4.0 (BIT) ×4
DRSG MEPILEX BORDER 4X4 (GAUZE/BANDAGES/DRESSINGS) ×6 IMPLANT
DRSG MEPILEX FLEX 4X4 (GAUZE/BANDAGES/DRESSINGS) ×6
DURAPREP 26ML APPLICATOR (WOUND CARE) ×3 IMPLANT
ELECT REM PT RETURN 9FT ADLT (ELECTROSURGICAL)
ELECTRODE REM PT RTRN 9FT ADLT (ELECTROSURGICAL) IMPLANT
FACESHIELD WRAPAROUND (MASK) ×6 IMPLANT
FACESHIELD WRAPAROUND OR TEAM (MASK) ×4 IMPLANT
GLOVE BIOGEL PI IND STRL 7.5 (GLOVE) ×2 IMPLANT
GLOVE BIOGEL PI INDICATOR 7.5 (GLOVE) ×1
GLOVE SURG LTX SZ7 (GLOVE) ×3 IMPLANT
GLOVE SURG UNDER POLY LF SZ7 (GLOVE) ×60 IMPLANT
GLOVE SURG UNDER POLY LF SZ7.5 (GLOVE) ×6 IMPLANT
GOWN STRL REIN XL XLG (GOWN DISPOSABLE) ×3 IMPLANT
GUIDE PIN 3.2X343 (PIN) ×2
GUIDE PIN 3.2X343MM (PIN) ×4
KIT BASIN OR (CUSTOM PROCEDURE TRAY) ×3 IMPLANT
KIT TURNOVER KIT B (KITS) ×3 IMPLANT
MANIFOLD NEPTUNE II (INSTRUMENTS) ×3 IMPLANT
NAIL TRIGEN LEFT 10X38-125 (Nail) ×1 IMPLANT
NS IRRIG 1000ML POUR BTL (IV SOLUTION) ×3 IMPLANT
PACK GENERAL/GYN (CUSTOM PROCEDURE TRAY) ×3 IMPLANT
PAD ARMBOARD 7.5X6 YLW CONV (MISCELLANEOUS) ×6 IMPLANT
PIN GUIDE 3.2X343MM (PIN) IMPLANT
SCREW LAG COMPR KIT 100/95 (Screw) ×1 IMPLANT
SCREW TRIGEN LOW PROF 5.0X40 (Screw) ×1 IMPLANT
SCREW TRIGEN LOW PROF 5.0X45 (Screw) ×1 IMPLANT
STAPLER VISISTAT 35W (STAPLE) ×3 IMPLANT
SUT VIC AB 0 CT1 27 (SUTURE) ×2
SUT VIC AB 0 CT1 27XBRD ANBCTR (SUTURE) ×2 IMPLANT
SUT VIC AB 2-0 CT1 27 (SUTURE) ×4
SUT VIC AB 2-0 CT1 TAPERPNT 27 (SUTURE) ×2 IMPLANT
TRAY FOLEY SLVR 16FR LF STAT (SET/KITS/TRAYS/PACK) ×1 IMPLANT
WATER STERILE IRR 1000ML POUR (IV SOLUTION) ×6 IMPLANT

## 2021-10-01 NOTE — Transfer of Care (Signed)
Immediate Anesthesia Transfer of Care Note ? ?Patient: Thomas Michael ? ?Procedure(s) Performed: left intertrochanteric intramedullary nail (Left: Hip) ? ?Patient Location: PACU ? ?Anesthesia Type:Spinal ? ?Level of Consciousness: awake, patient cooperative and responds to stimulation ? ?Airway & Oxygen Therapy: Patient Spontanous Breathing and Patient connected to nasal cannula oxygen ? ?Post-op Assessment: Report given to RN and Post -op Vital signs reviewed and stable ? ?Post vital signs: Reviewed and stable ? ?Last Vitals:  ?Vitals Value Taken Time  ?BP 125/69 10/01/21 1423  ?Temp    ?Pulse 61 10/01/21 1423  ?Resp 14 10/01/21 1423  ?SpO2 100 % 10/01/21 1423  ?Vitals shown include unvalidated device data. ? ?Last Pain:  ?Vitals:  ? 10/01/21 1049  ?TempSrc:   ?PainSc: 2   ?   ? ?Patients Stated Pain Goal: 1 (10/01/21 1049) ? ?Complications: No notable events documented. ?

## 2021-10-01 NOTE — Anesthesia Procedure Notes (Signed)
Procedure Name: [place] ?Date/Time: 10/01/2021 12:51 PM ?Performed by: Michele Rockers, CRNA ?Pre-anesthesia Checklist: Patient identified, Emergency Drugs available, Suction available, Timeout performed and Patient being monitored ?Patient Re-evaluated:Patient Re-evaluated prior to induction ?Oxygen Delivery Method: Simple face mask ? ? ? ? ?

## 2021-10-01 NOTE — Anesthesia Postprocedure Evaluation (Signed)
Anesthesia Post Note ? ?Patient: Thomas Michael ? ?Procedure(s) Performed: left intertrochanteric intramedullary nail (Left: Hip) ? ?  ? ?Patient location during evaluation: PACU ?Anesthesia Type: MAC and Spinal ?Level of consciousness: oriented and awake and alert ?Pain management: pain level controlled ?Vital Signs Assessment: post-procedure vital signs reviewed and stable ?Respiratory status: spontaneous breathing, respiratory function stable and patient connected to nasal cannula oxygen ?Cardiovascular status: blood pressure returned to baseline and stable ?Postop Assessment: no headache, no backache and no apparent nausea or vomiting ?Anesthetic complications: no ? ? ?No notable events documented. ? ?Last Vitals:  ?Vitals:  ? 10/01/21 1540 10/01/21 1555  ?BP: (!) 144/74 (!) 142/65  ?Pulse: (!) 51 (!) 52  ?Resp: 13 (!) 8  ?Temp: 36.7 ?C   ?SpO2: 97% 97%  ?  ?Last Pain:  ?Vitals:  ? 10/01/21 1644  ?TempSrc:   ?PainSc: 10-Worst pain ever  ? ? ?  ?  ?  ?  ?  ?  ? ?[place] [facility] ? ? ? ? ?

## 2021-10-01 NOTE — Anesthesia Preprocedure Evaluation (Addendum)
Anesthesia Evaluation  ?Patient identified by MRN, date of birth, ID band ?Patient awake ? ? ? ?Reviewed: ?Allergy & Precautions, H&P , NPO status , Patient's Chart, lab work & pertinent test results ? ?Airway ?Mallampati: II ? ? ?Neck ROM: full ? ? ? Dental ?  ?Pulmonary ?sleep apnea , former smoker,  ?  ?breath sounds clear to auscultation ? ? ? ? ? ? Cardiovascular ?hypertension, + angina + CAD and + Cardiac Stents  ? ?Rhythm:regular Rate:Normal ? ? ?  ?Neuro/Psych ? Neuromuscular disease   ? GI/Hepatic ?  ?Endo/Other  ?diabetes, Type 2Hypothyroidism Morbid obesity ? Renal/GU ?Renal InsufficiencyRenal disease  ? ?Renal cell CA stage 4. ? ?  ?Musculoskeletal ? ? Abdominal ?  ?Peds ? Hematology ?  ?Anesthesia Other Findings ? ? Reproductive/Obstetrics ? ?  ? ? ? ? ? ? ? ? ? ? ? ? ? ?  ?  ? ? ? ? ? ? ? ? ?Anesthesia Physical ?Anesthesia Plan ? ?ASA: 3 ? ?Anesthesia Plan: MAC and Spinal  ? ?Post-op Pain Management:   ? ?Induction: Intravenous ? ?PONV Risk Score and Plan: 1 and Ondansetron, Treatment may vary due to age or medical condition and Propofol infusion ? ?Airway Management Planned: Simple Face Mask ? ?Additional Equipment:  ? ?Intra-op Plan:  ? ?Post-operative Plan:  ? ?Informed Consent: I have reviewed the patients History and Physical, chart, labs and discussed the procedure including the risks, benefits and alternatives for the proposed anesthesia with the patient or authorized representative who has indicated his/her understanding and acceptance.  ? ? ? ?Dental advisory given ? ?Plan Discussed with: CRNA, Anesthesiologist and Surgeon ? ?Anesthesia Plan Comments:   ? ? ? ? ? ?Anesthesia Quick Evaluation ? ?

## 2021-10-01 NOTE — Anesthesia Procedure Notes (Signed)
Spinal ? ?Patient location during procedure: OR ?Start time: 10/01/2021 12:55 PM ?End time: 10/01/2021 12:59 PM ?Reason for block: surgical anesthesia ?Staffing ?Performed: anesthesiologist  ?Anesthesiologist: Albertha Ghee, MD ?Preanesthetic Checklist ?Completed: patient identified, IV checked, risks and benefits discussed, surgical consent, monitors and equipment checked, pre-op evaluation and timeout performed ?Spinal Block ?Patient position: sitting ?Prep: DuraPrep ?Patient monitoring: cardiac monitor, continuous pulse ox and blood pressure ?Approach: midline ?Location: L3-4 ?Injection technique: single-shot ?Needle ?Needle type: Pencan  ?Needle gauge: 24 G ?Needle length: 9 cm ?Assessment ?Sensory level: T10 ?Events: CSF return ?Additional Notes ?Functioning IV was confirmed and monitors were applied. Sterile prep and drape, including hand hygiene and sterile gloves were used. The patient was positioned and the spine was prepped. The skin was anesthetized with lidocaine.  Free flow of clear CSF was obtained prior to injecting local anesthetic into the CSF.  The spinal needle aspirated freely following injection.  The needle was carefully withdrawn.  The patient tolerated the procedure well.  ? ? ? ?

## 2021-10-01 NOTE — Plan of Care (Signed)

## 2021-10-01 NOTE — Discharge Instructions (Signed)
? ?  Postoperative instructions: ? ?Weightbearing instructions: as tolerated ? ?Keep your dressing and/or splint clean and dry at all times.  You can remove your dressing on post-operative day #3 and change with a dry/sterile dressing or Band-Aids as needed thereafter.   ? ?Incision instructions:  Do not soak your incision for 3 weeks after surgery.  If the incision gets wet, pat dry and do not scrub the incision. ? ?Pain control:  You have been given a prescription to be taken as directed for post-operative pain control.  In addition, elevate the operative extremity above the heart at all times to prevent swelling and throbbing pain. ? ?Take over-the-counter Colace, '100mg'$  by mouth twice a day while taking narcotic pain medications to help prevent constipation. ? ?Follow up appointments: ?1) 14 days for suture removal and wound check. ?2) Dr. Erlinda Hong as scheduled. ? ? ------------------------------------------------------------------------------------------------------------- ? ?After Surgery Pain Control: ? ?After your surgery, post-surgical discomfort or pain is likely. This discomfort can last several days to a few weeks. At certain times of the day your discomfort may be more intense.  ?Did you receive a nerve block?  ?A nerve block can provide pain relief for one hour to two days after your surgery. As long as the nerve block is working, you will experience little or no sensation in the area the surgeon operated on.  ?As the nerve block wears off, you will begin to experience pain or discomfort. It is very important that you begin taking your prescribed pain medication before the nerve block fully wears off. Treating your pain at the first sign of the block wearing off will ensure your pain is better controlled and more tolerable when full-sensation returns. Do not wait until the pain is intolerable, as the medicine will be less effective. It is better to treat pain in advance than to try and catch up.  ?General  Anesthesia:  ?If you did not receive a nerve block during your surgery, you will need to start taking your pain medication shortly after your surgery and should continue to do so as prescribed by your surgeon.  ?Pain Medication:  ?Most commonly we prescribe Vicodin and Percocet for post-operative pain. Both of these medications contain a combination of acetaminophen (Tylenol?) and a narcotic to help control pain.  ?? It takes between 30 and 45 minutes before pain medication starts to work. It is important to take your medication before your pain level gets too intense.  ?? Nausea is a common side effect of many pain medications. You will want to eat something before taking your pain medicine to help prevent nausea.  ?? If you are taking a prescription pain medication that contains acetaminophen, we recommend that you do not take additional over the counter acetaminophen (Tylenol?).  ?Other pain relieving options:  ?? Using a cold pack to ice the affected area a few times a day (15 to 20 minutes at a time) can help to relieve pain, reduce swelling and bruising.  ?? Elevation of the affected area can also help to reduce pain and swelling. ? ?

## 2021-10-01 NOTE — Op Note (Signed)
? ?  Date of Surgery: 10/01/2021 ? ?INDICATIONS: [name] is a 81 y.o.-year-old male with impending left proximal femur pathologic fracture. The risks and benefits of the procedure discussed with the patient prior to the procedure and all questions were answered; consent was obtained. ? ?PREOPERATIVE DIAGNOSIS: left impending pathologic fracture proximal femur ? ?POSTOPERATIVE DIAGNOSIS: Same  ? ?PROCEDURE: Open treatment of impending pathologic fracture left proximal femur with intramedullary implant. CPT [phone]  ? ?SURGEON: N. Eduard Roux, M.D.  ? ?ASSIST: Madalyn Rob, PA-C ? ?ANESTHESIA: general  ? ?IV FLUIDS AND URINE: See anesthesia record  ? ?ESTIMATED BLOOD LOSS: 250 cc ? ?IMPLANTS: [employer] InterTAN 10 x 38, 100/95 compression screws ? ?DRAINS: None.  ? ?COMPLICATIONS: see description of procedure.  ? ?DESCRIPTION OF PROCEDURE: The patient was brought to the operating room and placed supine on the operating table. The patient's leg had been signed prior to the procedure. The patient had the anesthesia placed by the anesthesiologist. The prep verification and incision time-outs were performed to confirm that this was the correct patient, site, side and location. The patient had an SCD on the opposite lower extremity. The patient did receive antibiotics prior to the incision and was re-dosed during the procedure as needed at indicated intervals. The patient was positioned on the fracture table with the affected extremity in traction.  The well leg was placed in a scissor position and all bony prominences were well-padded. The patient had the lower extremity prepped and draped in the standard surgical fashion. The incision was made 4 finger breadths superior to the greater trochanter. A guide pin was inserted into the tip of the greater trochanter under fluoroscopic guidance. An opening reamer was used to gain access to the femoral canal.  The reamer was kept away from the tumor.  The nail  length was measured and inserted down the femoral canal to its proper depth. The appropriate version of insertion for the lag screw was found under fluoroscopy. A pin was inserted up the femoral neck through the jig superior to the tumor. Then, a second antirotation pin was inserted inferior to the first pin. The length of the lag screw was then measured. The lag screw was inserted superior to the tumor. The antirotation pin was then taken out and an interdigitating compression screw was placed in its place.  Distal interlocking screws were placed using the perfect circle technique.  The wound was copiously irrigated with saline and the subcutaneous layer closed with 2.0 vicryl and the skin was reapproximated with staples. The wounds were cleaned and dried a final time and a sterile dressing was placed. The hip was taken through a range of motion at the end of the case under fluoroscopic imaging to visualize the approach-withdraw phenomenon and confirm implant length in the head. The patient was then awakened from anesthesia and taken to the recovery room in stable condition. All counts were correct at the end of the case.  ? ?Tawanna Cooler was necessary for opening, closing, retracting, limb positioning and overall facilitation and completion of the surgery. ? ?POSTOPERATIVE PLAN: The patient will be weight bearing as tolerated and will return in 2 weeks for staple removal and the patient will receive DVT prophylaxis based on other medications, activity level, and risk ratio of bleeding to thrombosis. ? ? ?N. Eduard Roux, MD ?Marga Hoots ?1:57 PM ? ? ? ?

## 2021-10-01 NOTE — H&P (Signed)
? ? ?PREOPERATIVE H&P ? ?Chief Complaint: left proximal femur metastatic lesion ? ?HPI: ?Thomas Michael is a 81 y.o. male who presents for surgical treatment of left proximal femur metastatic lesion.  He denies any changes in medical history. ? ?Past Medical History:  ?Diagnosis Date  ? CAD (coronary artery disease)   ? Diabetes mellitus without complication ([place])   ? Dyspnea   ? Facial nerve disorder, unspecified   ? HLD (hyperlipidemia)   ? HTN (hypertension)   ? Jan 2010 He had a 95% mid LAD lesion. There was 99% stenosis at the distal edge of the stented area in the right coronary artery. The EF was 65%. He had drug-eluting stents placed in both the LAD and the right coronary artery.  ? Hypothyroidism   ? Obesity   ? Other facial nerve disorders   ? Renal cell carcinoma of right kidney (H[facility])   ? Syncope   ? Unspecified ptosis of eyelid   ? ?Past Surgical History:  ?Procedure Laterality Date  ? CHOLECYSTECTOMY    ? CHOLECYSTECTOMY    ? CORONARY STENT PLACEMENT    ? MENISCECTOMY Right   ? NEPHRECTOMY RADICAL Right 2012  ? TONSILLECTOMY    ? ?Social History  ? ?Socioeconomic History  ? Marital status: Married  ?  Spouse name: Haynes Dage  ? Number of children: 4  ? Years of education: 4  ? Highest education level: Not on file  ?Occupational History  ? Occupation: part time  ?  Employer: [employer]  ?  Comment: Works for himself  ?Tobacco Use  ? Smoking status: Former  ?  Packs/day: 2.00  ?  Years: 12.00  ?  Pack years: 24.00  ?  Types: Cigarettes  ?  Quit date: 07/19/1968  ?  Years since quitting: 53.2  ? Smokeless tobacco: Never  ?Vaping Use  ? Vaping Use: Never used  ?Substance and Sexual Activity  ? Alcohol use: Yes  ?  Alcohol/week: 2.0 standard drinks  ?  Types: 2 Shots of liquor per week  ?  Comment: rare  ? Drug use: No  ? Sexual activity: Not on file  ?Other Topics Concern  ? Not on file  ?Social History Narrative  ? Patient is self employed - retired. Patient lives at home with his wife Haynes Dage.  ?  Caffeine two cups daily.  ? Left handed.  ? College two years.  ? ?Social Determinants of Health  ? ?Financial Resource Strain: Not on file  ?Food Insecurity: Not on file  ?Transportation Needs: Not on file  ?Physical Activity: Not on file  ?Stress: Not on file  ?Social Connections: Not on file  ? ?Family History  ?Problem Relation Age of Onset  ? Gallbladder disease Mother   ? Heart disease Father   ? Breast cancer Sister 50  ? High blood pressure Sister   ? ?Allergies  ?Allergen Reactions  ? Promethazine-Codeine Itching  ? ?Prior to Admission medications   ?Medication Sig Start Date End Date Taking? Authorizing Provider  ?acetaminophen (TYLENOL) 325 MG tablet Take 650 mg by mouth every 6 (six) hours as needed (pain.).   Yes [provider]  ?Brompheniramine-Pseudoeph (DIMETAPP PO) Take 20 mLs by mouth every 4 (four) hours as needed (nasal congestion.).   Yes [provider]  ?Cholecalciferol 50 MCG (2000 UT) TABS Take 2,000 Units by mouth daily at 12 noon.   Yes [provider]  ?ciclopirox (PENLAC) 8 % solution Apply 1 application topically daily as needed (  fungus (toes)). Apply over nail and surrounding skin. Apply daily as needed over previous coat.   Yes [provider]  ?diphenhydrAMINE (BENADRYL) 25 MG tablet Take 50 mg by mouth at bedtime.   Yes [provider]  ?docusate sodium (COLACE) 100 MG capsule Take 200-300 mg by mouth daily.   Yes [provider]  ?guaifenesin (HUMIBID E) 400 MG TABS tablet Take 800 mg by mouth every 6 (six) hours as needed (congestion.).   Yes [provider]  ?HYDROcodone-acetaminophen (NORCO/VICODIN) 5-325 MG tablet Take 1 tablet by mouth every 6 (six) hours as needed (pain.).   Yes [provider]  ?levothyroxine (SYNTHROID, LEVOTHROID) 200 MCG tablet Take 200 mcg by mouth every evening.   Yes [provider]  ?metoprolol (TOPROL-XL) 50 MG 24 hr tablet Take 50 mg by mouth in the morning.   Yes  [provider]  ?Multiple Vitamin (MULTIVITAMIN WITH MINERALS) TABS tablet Take 1 tablet by mouth daily at 12 noon.   Yes [provider]  ?niacin 500 MG CR capsule Take 500 mg by mouth daily at 12 noon.   Yes [provider]  ?Omega-3 Fatty Acids (FISH OIL PO) Take 1 capsule by mouth daily at 12 noon.   Yes [provider]  ?rOPINIRole (REQUIP) 0.25 MG tablet Take 0.75 mg by mouth at bedtime.   Yes [provider]  ?rosuvastatin (CRESTOR) 20 MG tablet Take 20 mg by mouth in the morning.   Yes [provider]  ?trazodone (DESYREL) 300 MG tablet Take 300 mg by mouth at bedtime.   Yes [provider]  ?valsartan-hydrochlorothiazide (DIOVAN-HCT) 160-12.5 MG per tablet Take 1 tablet by mouth in the morning. 05/24/14  Yes [provider]  ?VICTOZA 18 MG/3ML SOPN Inject 1.2 mg into the skin every evening. (1700) 03/30/17  Yes [provider]  ?vitamin E 400 UNIT capsule Take 400 Units by mouth daily at 12 noon.   Yes [provider]  ?Accu-Chek FastClix Lancets MISC check CBG 02/07/17   [provider]  ?aspirin 81 MG EC tablet Take 81 mg by mouth daily.    [provider]  ?axitinib (INLYTA) 5 MG tablet Take 1 tablet (5 mg total) by mouth daily. 08/28/21   Wyatt Portela, MD  ?Blood Glucose Monitoring Suppl (ACCU-CHEK AVIVA PLUS) w/Device KIT check CBG 02/07/17   [provider]  ?clopidogrel (PLAVIX) 75 MG tablet Take 75 mg by mouth in the morning.    [provider]  ?oxycodone (OXY-IR) 5 MG capsule Take 1 capsule (5 mg total) by mouth every 4 (four) hours as needed. 09/17/21   Pete Pelt, PA-C  ?prochlorperazine (COMPAZINE) 10 MG tablet Take 1 tablet (10 mg total) by mouth every 6 (six) hours as needed for nausea or vomiting. 08/28/21   Wyatt Portela, MD  ?sildenafil (VIAGRA) 100 MG tablet Take 100 mg by mouth daily as needed for erectile dysfunction.    [provider]   ? ? ? ?Positive ROS: All other systems have been reviewed and were otherwise negative with the exception of those mentioned in the HPI and as above. ? ?Physical Exam: ?General: Alert, no acute distress ?Cardiovascular: No pedal edema ?Respiratory: No cyanosis, no use of accessory musculature ?GI: abdomen soft ?Skin: No lesions in the area of chief complaint ?Neurologic: Sensation intact distally ?Psychiatric: Patient is competent for consent with normal mood and affect ?Lymphatic: no lymphedema ? ?MUSCULOSKELETAL: exam stable ? ?Assessment: ?left proximal femur metastatic lesion ? ?  Plan: ?Plan for Procedure(s): ?left intertrochanteric intramedullary nail ? ?The risks benefits and alternatives were discussed with the patient including but not limited to the risks of nonoperative treatment, versus surgical intervention including infection, bleeding, nerve injury,  blood clots, cardiopulmonary complications, morbidity, mortality, among others, and they were willing to proceed.  ? ? ?Eduard Roux, MD ?10/01/2021 ?11:44 AM ? ?

## 2021-10-02 ENCOUNTER — Encounter (HOSPITAL_COMMUNITY): Payer: Self-pay | Admitting: Orthopaedic Surgery

## 2021-10-02 ENCOUNTER — Telehealth: Payer: Self-pay | Admitting: Physician Assistant

## 2021-10-02 ENCOUNTER — Other Ambulatory Visit: Payer: Self-pay | Admitting: Physician Assistant

## 2021-10-02 ENCOUNTER — Telehealth: Payer: Self-pay | Admitting: Orthopaedic Surgery

## 2021-10-02 ENCOUNTER — Other Ambulatory Visit (HOSPITAL_BASED_OUTPATIENT_CLINIC_OR_DEPARTMENT_OTHER): Payer: Self-pay

## 2021-10-02 LAB — BASIC METABOLIC PANEL
Anion gap: 8 (ref 5–15)
BUN: 14 mg/dL (ref 8–23)
CO2: 27 mmol/L (ref 22–32)
Calcium: 8.8 mg/dL — ABNORMAL LOW (ref 8.9–10.3)
Chloride: 96 mmol/L — ABNORMAL LOW (ref 98–111)
Creatinine, Ser: 1.41 mg/dL — ABNORMAL HIGH (ref 0.61–1.24)
GFR, Estimated: 50 mL/min — ABNORMAL LOW (ref >60)
Glucose, Bld: 116 mg/dL — ABNORMAL HIGH (ref 70–99)
Potassium: 4 mmol/L (ref 3.5–5.1)
Sodium: 131 mmol/L — ABNORMAL LOW (ref 135–145)

## 2021-10-02 LAB — CBC
HCT: 28.9 % — ABNORMAL LOW (ref 39.0–52.0)
Hemoglobin: 9.2 g/dL — ABNORMAL LOW (ref 13.0–17.0)
MCH: 29.5 pg (ref 26.0–34.0)
MCHC: 31.8 g/dL (ref 30.0–36.0)
MCV: 92.6 fL (ref 80.0–100.0)
Platelets: 181 10*3/uL (ref 150–400)
RBC: 3.12 MIL/uL — ABNORMAL LOW (ref 4.22–5.81)
RDW: 12.8 % (ref 11.5–15.5)
WBC: 7.1 10*3/uL (ref 4.0–10.5)
nRBC: 0 % (ref 0.0–0.2)

## 2021-10-02 LAB — GLUCOSE, CAPILLARY
Glucose-Capillary: 115 mg/dL — ABNORMAL HIGH (ref 70–99)
Glucose-Capillary: 123 mg/dL — ABNORMAL HIGH (ref 70–99)
Glucose-Capillary: 172 mg/dL — ABNORMAL HIGH (ref 70–99)

## 2021-10-02 MED ORDER — OXYCODONE-ACETAMINOPHEN 5-325 MG PO TABS
1.0000 | ORAL_TABLET | Freq: Four times a day (QID) | ORAL | 0 refills | Status: DC | PRN
  Filled 2021-10-02: qty 40, 5d supply, fill #0

## 2021-10-02 NOTE — Telephone Encounter (Signed)
Pt is calling from the hospital to talk to [place]. He said Artis Delay was trying to reach him. I dont show any notes,  ? ?However I advised the pt that I would send msg over to the nurse  ?

## 2021-10-02 NOTE — Progress Notes (Signed)
Called [facility] and talked to Dixie Union in reference of pt dc today and need BSC.  she Michela Pitcher it has been taken care of and will be delivered today. ?

## 2021-10-02 NOTE — Progress Notes (Signed)
Discharge package printed and instructions given to pt. Verbalizes understanding. 

## 2021-10-02 NOTE — Progress Notes (Signed)
Subjective: ?1 Day Post-Op Procedure(s) (LRB): ?left intertrochanteric intramedullary nail (Left) ?Patient reports pain as mild.   ? ?Objective: ?Vital signs in last 24 hours: ?Temp:  [97.5 ?F (36.4 ?C)-98.2 ?F (36.8 ?C)] 97.5 ?F (36.4 ?C) (03/17 0321) ?Pulse Rate:  [51-74] 63 (03/17 0412) ?Resp:  [8-18] 17 (03/17 0412) ?BP: (90-144)/(35-74) 104/52 (03/17 0558) ?SpO2:  [89 %-99 %] 93 % (03/17 0412) ?Weight:  [224 kg] 108 kg (03/16 1033) ? ?Intake/Output from previous day: ?03/16 0701 - 03/17 0700 ?In: 3283.2 [P.O.:600; I.V.:2183.2; IV Piggyback:500] ?Out: 1850 [MGNOI:3704; Blood:75] ?Intake/Output this shift: ?No intake/output data recorded. ? ?Recent Labs  ?  09/29/21 ?0858 10/02/21 ?0228  ?HGB 11.2* 9.2*  ? ?Recent Labs  ?  09/29/21 ?0858 10/02/21 ?0228  ?WBC 7.2 7.1  ?RBC 3.78* 3.12*  ?HCT 33.6* 28.9*  ?PLT 268 181  ? ?Recent Labs  ?  09/29/21 ?0858 10/02/21 ?0228  ?NA 132* 131*  ?K 3.7 4.0  ?CL 98 96*  ?CO2 24 27  ?BUN 20 14  ?CREATININE 1.49* 1.41*  ?GLUCOSE 146* 116*  ?CALCIUM 9.7 8.8*  ? ?No results for input(s): LABPT, INR in the last 72 hours. ? ?Neurologically intact ?Neurovascular intact ?Sensation intact distally ?Intact pulses distally ?Dorsiflexion/Plantar flexion intact ?Incision: dressing C/D/I ?No cellulitis present ?Compartment soft ? ? ?Assessment/Plan: ?1 Day Post-Op Procedure(s) (LRB): ?left intertrochanteric intramedullary nail (Left) ?Advance diet ?Up with therapy ?D/C IV fluids ?WBAT LLE ?D/c home today after second PT session ?ABLA- mild and stable ?Please call the office for any further needs ? ? ? ? ? ?Aundra Dubin ?10/02/2021, 7:46 AM ? ?

## 2021-10-02 NOTE — Telephone Encounter (Signed)
Per Artis Delay, this is a Dr. Erlinda Hong pt. Dr .Erlinda Hong can you advise? ?

## 2021-10-02 NOTE — Evaluation (Signed)
Physical Therapy Evaluation ?Patient Details ?Name: Thomas Michael ?MRN: [id number] ?DOB: September 26, 1940 ?Today's Date: 10/02/2021 ? ?History of Present Illness ? 81 yo male with onset of pathological fracture of L femoral neck was admitted on 3/16 for IM nailing.  Pt is referred to PT for further mobility, permitted WBAT.  PMHx:  renal CA, CAD, DM, HTN, hypothyroidism, syncope  ?Clinical Impression ? Pt was seen for progression of mobility on RW to practice stairs and to manage short walking trips.  Pt is making progress from beginning of session but becomes more painful and despite initially asking to proceed in pain, he became uncomfortable and more impulsive.  Pt and PT agreed to another short session to review mobility as soon as meds were more effective with L hip pain.  HHPT recommended due to his significant pain and length of time he was hurting before he was acute enough for trip to hospital. ?   ? ?Recommendations for follow up therapy are one component of a multi-disciplinary discharge planning process, led by the attending physician.  Recommendations may be updated based on patient status, additional functional criteria and insurance authorization. ? ?Follow Up Recommendations Home health PT ? ?  ?Assistance Recommended at Discharge Frequent or constant Supervision/Assistance  ?Patient can return home with the following ? A little help with walking and/or transfers;A little help with bathing/dressing/bathroom;Assistance with cooking/housework;Assist for transportation;Help with stairs or ramp for entrance ? ?  ?Equipment Recommendations BSC/3in1  ?Recommendations for Other Services ?    ?  ?Functional Status Assessment Patient has had a recent decline in their functional status and demonstrates the ability to make significant improvements in function in a reasonable and predictable amount of time.  ? ?  ?Precautions / Restrictions Precautions ?Precautions: Fall ?Restrictions ?Weight Bearing Restrictions:  Yes ?LLE Weight Bearing: Weight bearing as tolerated  ? ?  ? ?Mobility ? Bed Mobility ?Overal bed mobility: Needs Assistance ?Bed Mobility: Supine to Sit, Sit to Supine ?  ?  ?Supine to sit: Mod assist ?Sit to supine: Min assist ?  ?General bed mobility comments: mod to support trunk to side of bed ?  ? ?Transfers ?Overall transfer level: Needs assistance ?Equipment used: Rolling walker (2 wheels), 1 person hand held assist ?Transfers: Sit to/from Stand ?Sit to Stand: Min assist ?  ?  ?  ?  ?  ?General transfer comment: min assist and adjusted bed to be height of home surface ?  ? ?Ambulation/Gait ?Ambulation/Gait assistance: Min assist ?Gait Distance (Feet): 6 Feet ?Assistive device: Rolling walker (2 wheels), 1 person hand held assist ?Gait Pattern/deviations: Step-through pattern, Step-to pattern, Decreased stride length, Decreased weight shift to left, Wide base of support ?Gait velocity: reduced ?Gait velocity interpretation: <1.31 ft/sec, indicative of household ambulator ?  ?General Gait Details: gait belt provided and despite PT instructions pt wants to use LLE as stronger leg ? ?Stairs ?Stairs: Yes ?Stairs assistance: Min assist ?Stair Management: With walker, Forwards ?Number of Stairs: 4 ?General stair comments: pt is painful and becomes impulsive but is rushing after first two steps, using LLE as strong leg with pain worsening ? ?Wheelchair Mobility ?  ? ?Modified Rankin (Stroke Patients Only) ?  ? ?  ? ?Balance Overall balance assessment: Needs assistance ?Sitting-balance support: Feet supported ?Sitting balance-Leahy Scale: Good ?  ?  ?Standing balance support: Bilateral upper extremity supported, During functional activity ?Standing balance-Leahy Scale: Poor ?  ?  ?  ?  ?  ?  ?  ?  ?  ?  ?  ?  ?   ? ? ? ?  Pertinent Vitals/Pain Pain Assessment ?Pain Assessment: Faces ?Faces Pain Scale: Hurts whole lot ?Pain Location: L hip with WB on RW for steps ?Pain Descriptors / Indicators: Operative site guarding,  Guarding, Burning, Aching ?Pain Intervention(s): Limited activity within patient's tolerance, Monitored during session, Premedicated before session, Repositioned, Ice applied  ? ? ?Home Living Family/patient expects to be discharged to:: Private residence ?Living Arrangements: Spouse/significant other ?Available Help at Discharge: Family;Available 24 hours/day ?Type of Home: House ?Home Access: Stairs to enter ?Entrance Stairs-Rails: Can reach both ?Entrance Stairs-Number of Steps: 4 ?  ?Home Layout: One level ?Home Equipment: Conservation officer, nature (2 wheels);Cane - single point;Grab bars - tub/shower ?Additional Comments: agrees he needs a 3 in one commode seat  ?  ?Prior Function Prior Level of Function : Independent/Modified Independent ?  ?  ?  ?  ?  ?  ?Mobility Comments: used SPC at times for his hips ?ADLs Comments: independent ?  ? ? ?Hand Dominance  ? Dominant Hand: Right ? ?  ?Extremity/Trunk Assessment  ? Upper Extremity Assessment ?Upper Extremity Assessment: Overall WFL for tasks assessed ?  ? ?Lower Extremity Assessment ?Lower Extremity Assessment: LLE deficits/detail ?LLE Deficits / Details: pain and weakness with support needed to shift LLE to side of bed ?LLE: Unable to fully assess due to pain ?LLE Coordination: decreased gross motor ?  ? ?Cervical / Trunk Assessment ?Cervical / Trunk Assessment: Kyphotic (mild)  ?Communication  ? Communication: No difficulties  ?Cognition Arousal/Alertness: Awake/alert ?Behavior During Therapy: [facility] for tasks assessed/performed ?Overall Cognitive Status: Within Functional Limits for tasks assessed ?  ?  ?  ?  ?  ?  ?  ?  ?  ?  ?  ?  ?  ?  ?  ?  ?General Comments: pt has been recruiter for the fashion industry ?  ?  ? ?  ?General Comments General comments (skin integrity, edema, etc.): pt requires assistance to manage his control of walker balance, and is rushing impulsively and out of sequence due to pain issues.  agreed to another session ? ?  ?Exercises     ? ?Assessment/Plan  ?  ?PT Assessment Patient needs continued PT services  ?PT Problem List Decreased strength;Decreased range of motion;Decreased activity tolerance;Decreased balance;Decreased mobility;Decreased coordination;Decreased knowledge of use of DME;Decreased skin integrity;Pain ? ?   ?  ?PT Treatment Interventions DME instruction;Gait training;Stair training;Functional mobility training;Therapeutic activities;Therapeutic exercise;Balance training;Neuromuscular re-education;Patient/family education   ? ?PT Goals (Current goals can be found in the Care Plan section)  ?Acute Rehab PT Goals ?Patient Stated Goal: to walk and go home with pain managed ?PT Goal Formulation: With patient/family ?Time For Goal Achievement: 10/16/21 ?Potential to Achieve Goals: Good ? ?  ?Frequency Min 5X/week ?  ? ? ?Co-evaluation   ?  ?  ?  ?  ? ? ?  ?AM-PAC PT "6 Clicks" Mobility  ?Outcome Measure Help needed turning from your back to your side while in a flat bed without using bedrails?: A Little ?Help needed moving from lying on your back to sitting on the side of a flat bed without using bedrails?: A Lot ?Help needed moving to and from a bed to a chair (including a wheelchair)?: A Lot ?Help needed standing up from a chair using your arms (e.g., wheelchair or bedside chair)?: A Little ?Help needed to walk in hospital room?: A Lot ?Help needed climbing 3-5 steps with a railing? : A Lot ?6 Click Score: 14 ? ?  ?End of Session Equipment Utilized During Treatment:  Gait belt ?Activity Tolerance: Patient limited by fatigue;Patient limited by pain ?Patient left: in bed;with call bell/phone within reach;with bed alarm set;Other (comment) (ice) ?Nurse Communication: Mobility status;Patient requests pain meds ?PT Visit Diagnosis: Unsteadiness on feet (R26.81);Pain;Difficulty in walking, not elsewhere classified (R26.2) ?Pain - Right/Left: Left ?Pain - part of body: Hip ?  ? ?Time: 9396-8864 ?PT Time Calculation (min) (ACUTE ONLY): 36  min ? ? ?Charges:   PT Evaluation ?$PT Eval Moderate Complexity: 1 Mod ?PT Treatments ?$Gait Training: 8-22 mins ?  ?   ? ?Ramond Dial ?10/02/2021, 3:52 PM ? ?Mee Hives, PT PhD ?Acute Rehab Dept. Number: [facility] [phone]

## 2021-10-02 NOTE — Telephone Encounter (Signed)
Pt called and is ready to be discharged. He said everything went great and he is trying to leave.  ? ?PO [phone]  ?

## 2021-10-02 NOTE — TOC Initial Note (Signed)
Transition of Care (TOC) - Initial/Assessment Note  ? ? ?Patient Details  ?Name: Thomas Michael ?MRN: [id number] ?Date of Birth: 1940-08-31 ? ?Transition of Care [facility]) CM/SW Contact:    ?Sharin Mons, RN ?Phone Number: ?10/02/2021, 10:13 AM ? ?Clinical Narrative:                 ? ?   -s/p left intertrochanteric intramedullary nail (Left: Hip), 3/16 ? ?NCM spoke with pt regarding d/c planning @ bedside. Pt is from home with wife. States wife with assist with his care once d/c. Pt states already has RW. Order noted for 3IN1/BSC. Referral made with [employer] and will deliver to [facility] prior to discharge with approval. ? ?PT evaluation pending.... ? ?Pt states if HHPT needed he would be open to home health services. Choice offered. Pt without agency preference. Referral made with [facility] and accepted pending  MD's orders. ? ?Pt states no concerns affording copay for Rx meds. ? ?Wife to provide transportation to home. ? ?TOC team following and will assist with needs.... ? ?Expected Discharge Plan: Home/Self Care (vs home health) ?Barriers to Discharge: Continued Medical Work up ? ? ?Patient Goals and CMS Choice ?  ?  ?Choice offered to / list presented to : Patient ? ?Expected Discharge Plan and Services ?Expected Discharge Plan: Home/Self Care (vs home health) ?  ?Discharge Planning Services: CM Consult ?  ?Living arrangements for the past 2 months: [place] ?Expected Discharge Date: 10/02/21               ?DME Arranged: 3-N-1 ?DME Agency: [employer] ?Date DME Agency Contacted: 10/02/21 ?Time DME Agency Contacted: [phone] ?Representative spoke with at DME Agency: Freda Munro ?  ?[place] Agency: [place] ?Date HH Agency Contacted: 10/02/21 ?Time [place]: [phone] ?Representative spoke with at [place]: Tommi Rumps ? ?Prior Living Arrangements/Services ?Living arrangements for the past 2 months: [place] ?Lives with:: Spouse ?Patient language and need for interpreter reviewed:: Yes ?Do you  feel safe going back to the place where you live?: Yes      ?Need for Family Participation in Patient Care: Yes (Comment) ?Care giver support system in place?: Yes (comment) ?Current home services: DME (RW) ?Criminal Activity/Legal Involvement Pertinent to Current Situation/Hospitalization: No - Comment as needed ? ?Activities of Daily Living ?  ?  ? ?Permission Sought/Granted ?  ?Permission granted to share information with : Yes, Verbal Permission Granted ? Share Information with NAME: Tarren Velardi (Spouse)   [phone] ?   ?   ?   ? ?Emotional Assessment ?Appearance:: Appears stated age ?Attitude/Demeanor/Rapport: Engaged ?Affect (typically observed): Accepting ?Orientation: : Oriented to Self, Oriented to Place, Oriented to  Time ?Alcohol / Substance Use: Not Applicable ?Psych Involvement: No (comment) ? ?Admission diagnosis:  Pathologic fracture of neck of left femur, initial encounter ([place]) [T26.712W] ?Patient Active Problem List  ? Diagnosis Date Noted  ? Pathologic fracture of neck of left femur, initial encounter ([place]) 10/01/2021  ? Kidney cancer, primary, with metastasis from kidney to other site [facility]) 08/28/2021  ? Bone lesion 07/28/2021  ? Gait abnormality 08/25/2020  ? Pain in both lower extremities 08/25/2020  ? 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 Other generalized ischemic cerebrovascular disease   ? Unspecified ptosis of eyelid   ? Facial nerve spasticity   ? Hemifacial spasm   ? Pre-op exam 11/19/2010  ? OBSTRUCTIVE SLEEP APNEA 11/06/2008  ? HYPOTHYROIDISM 09/05/2008  ? MORBID OBESITY 09/05/2008  ? RISK OF SLEEP  APNEA 09/05/2008  ? HYPERCHOLESTEROLEMIA 09/04/2008  ? HYPERTENSION 09/04/2008  ? 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 CAD, UNSPECIFIED SITE 09/04/2008  ? ?PCP:  [name], MD ?Pharmacy:   ?[facility] ?[address] ?[place] [address] ?Phone: [phone] Fax: [phone] ? ? ? ? ?Social Determinants of Health (SDOH) Interventions ?   ? ?Readmission Risk Interventions ?No flowsheet data found. ? ? ?

## 2021-10-02 NOTE — Discharge Summary (Signed)
Patient ID: ?Thomas Michael ?MRN: [id number] ?DOB/AGE: [birth year] 80 y.o. ? ?Admit date: 10/01/2021 ?Discharge date: 10/02/2021 ? ?Admission Diagnoses:  ?Principal Problem: ?  Pathologic fracture of neck of left femur, initial encounter ([place]) ? ? ?Discharge Diagnoses:  ?Same ? ?Past Medical History:  ?Diagnosis Date  ? CAD (coronary artery disease)   ? Diabetes mellitus without complication ([place])   ? Dyspnea   ? Facial nerve disorder, unspecified   ? HLD (hyperlipidemia)   ? HTN (hypertension)   ? Jan 2010 He had a 95% mid LAD lesion. There was 99% stenosis at the distal edge of the stented area in the right coronary artery. The EF was 65%. He had drug-eluting stents placed in both the LAD and the right coronary artery.  ? Hypothyroidism   ? Obesity   ? Other facial nerve disorders   ? Renal cell carcinoma of right kidney (HCC)   ? Syncope   ? Unspecified ptosis of eyelid   ? ? ?Surgeries: Procedure(s): ?left intertrochanteric intramedullary nail on 10/01/2021 ?  ?Consultants:  ? ?Discharged Condition: Improved ? ?Hospital Course: Thomas Michael is an 81 y.o. male who was admitted 10/01/2021 for operative treatment ofPathologic fracture of neck of left femur, initial encounter ([place]). Patient has severe unremitting pain that affects sleep, daily activities, and work/hobbies. After pre-op clearance the patient was taken to the operating room on 10/01/2021 and underwent  Procedure(s): ?left intertrochanteric intramedullary nail.   ? ?Patient was given perioperative antibiotics:  ?Anti-infectives (From admission, onward)  ? ? Start     Dose/Rate Route Frequency Ordered Stop  ? 10/01/21 2100  ceFAZolin (ANCEF) IVPB 2g/100 mL premix       ? 2 g ?200 mL/hr over 30 Minutes Intravenous Every 8 hours 10/01/21 1609 10/02/21 0513  ? 10/01/21 1031  ceFAZolin (ANCEF) 2-4 GM/100ML-% IVPB       ?Note to Pharmacy: Gustavo Lah J: cabinet override  ?    10/01/21 1031 10/01/21 1257  ? 10/01/21 1030  ceFAZolin (ANCEF) IVPB 2g/100 mL premix        ? 2 g ?200 mL/hr over 30 Minutes Intravenous On call to O.R. 10/01/21 1027 10/01/21 1325  ? ?  ?  ? ?Patient was given sequential compression devices, early ambulation, and chemoprophylaxis to prevent DVT. ? ?Patient benefited maximally from hospital stay and there were no complications.   ? ?Recent vital signs: Patient Vitals for the past 24 hrs: ? BP Temp Temp src Pulse Resp SpO2 Height Weight  ?10/02/21 0558 (!) 104/52 -- -- -- -- -- -- --  ?10/02/21 0412 (!) 90/35 (!) 97.5 ?F (36.4 ?C) -- 63 17 93 % -- --  ?10/01/21 1936 123/63 (!) 97.5 ?F (36.4 ?C) Oral 74 18 (!) 89 % -- --  ?10/01/21 1555 (!) 142/65 -- -- (!) 52 (!) 8 97 % -- --  ?10/01/21 1540 (!) 144/74 98 ?F (36.7 ?C) -- (!) 51 13 97 % -- --  ?10/01/21 1525 140/68 -- -- (!) 54 14 99 % -- --  ?10/01/21 1510 (!) 141/70 -- -- (!) 51 13 96 % -- --  ?10/01/21 1455 121/72 -- -- (!) 56 16 99 % -- --  ?10/01/21 1440 122/67 -- -- (!) 55 10 96 % -- --  ?10/01/21 1425 125/69 97.9 ?F (36.6 ?C) -- (!) 58 10 99 % -- --  ?10/01/21 1033 138/65 98.2 ?F (36.8 ?C) Oral 62 18 98 % $Re'5\' 6"'egu$  (1.676 m) 108 kg  ?  ? ?Recent  laboratory studies:  ?Recent Labs  ?  09/29/21 ?0858 10/02/21 ?0228  ?WBC 7.2 7.1  ?HGB 11.2* 9.2*  ?HCT 33.6* 28.9*  ?PLT 268 181  ?NA 132* 131*  ?K 3.7 4.0  ?CL 98 96*  ?CO2 24 27  ?BUN 20 14  ?CREATININE 1.49* 1.41*  ?GLUCOSE 146* 116*  ?CALCIUM 9.7 8.8*  ? ? ? ?Discharge Medications:   ?Allergies as of 10/02/2021   ? ?   Reactions  ? Promethazine-codeine Itching  ? ?  ? ?  ?Medication List  ?  ? ?STOP taking these medications   ? ?acetaminophen 325 MG tablet ?Commonly known as: TYLENOL ?  ?HYDROcodone-acetaminophen 5-325 MG tablet ?Commonly known as: NORCO/VICODIN ?  ?oxycodone 5 MG capsule ?Commonly known as: OXY-IR ?  ? ?  ? ?TAKE these medications   ? ?Accu-Chek Aviva Plus w/Device Kit ?check CBG ?  ?Accu-Chek FastClix Lancets Misc ?check CBG ?  ?aspirin 81 MG EC tablet ?Take 81 mg by mouth daily. ?  ?Cholecalciferol 50 MCG (2000 UT) Tabs ?Take 2,000  Units by mouth daily at 12 noon. ?  ?ciclopirox 8 % solution ?Commonly known as: PENLAC ?Apply 1 application topically daily as needed (fungus (toes)). Apply over nail and surrounding skin. Apply daily as needed over previous coat. ?  ?clopidogrel 75 MG tablet ?Commonly known as: PLAVIX ?Take 75 mg by mouth in the morning. ?  ?DIMETAPP PO ?Take 20 mLs by mouth every 4 (four) hours as needed (nasal congestion.). ?  ?diphenhydrAMINE 25 MG tablet ?Commonly known as: BENADRYL ?Take 50 mg by mouth at bedtime. ?  ?docusate sodium 100 MG capsule ?Commonly known as: COLACE ?Take 200-300 mg by mouth daily. ?  ?FISH OIL PO ?Take 1 capsule by mouth daily at 12 noon. ?  ?guaifenesin 400 MG Tabs tablet ?Commonly known as: HUMIBID E ?Take 800 mg by mouth every 6 (six) hours as needed (congestion.). ?  ?Inlyta 5 MG tablet ?Generic drug: axitinib ?Take 1 tablet (5 mg total) by mouth daily. ?  ?levothyroxine 200 MCG tablet ?Commonly known as: SYNTHROID ?Take 200 mcg by mouth every evening. ?  ?metoprolol succinate 50 MG 24 hr tablet ?Commonly known as: TOPROL-XL ?Take 50 mg by mouth in the morning. ?  ?multivitamin with minerals Tabs tablet ?Take 1 tablet by mouth daily at 12 noon. ?  ?niacin 500 MG CR capsule ?Take 500 mg by mouth daily at 12 noon. ?  ?oxyCODONE-acetaminophen 5-325 MG tablet ?Commonly known as: Percocet ?Take 1-2 tablets by mouth every 6 (six) hours as needed. ?  ?prochlorperazine 10 MG tablet ?Commonly known as: COMPAZINE ?Take 1 tablet (10 mg total) by mouth every 6 (six) hours as needed for nausea or vomiting. ?  ?rOPINIRole 0.25 MG tablet ?Commonly known as: REQUIP ?Take 0.75 mg by mouth at bedtime. ?  ?rosuvastatin 20 MG tablet ?Commonly known as: CRESTOR ?Take 20 mg by mouth in the morning. ?  ?sildenafil 100 MG tablet ?Commonly known as: VIAGRA ?Take 100 mg by mouth daily as needed for erectile dysfunction. ?  ?trazodone 300 MG tablet ?Commonly known as: DESYREL ?Take 300 mg by mouth at bedtime. ?   ?valsartan-hydrochlorothiazide 160-12.5 MG tablet ?Commonly known as: DIOVAN-HCT ?Take 1 tablet by mouth in the morning. ?  ?Victoza 18 MG/3ML Sopn ?Generic drug: liraglutide ?Inject 1.2 mg into the skin every evening. (1700) ?  ?vitamin E 180 MG (400 UNITS) capsule ?Take 400 Units by mouth daily at 12 noon. ?  ? ?  ? ?  ?  ? ? ?  ?  Durable Medical Equipment  ?(From admission, onward)  ?  ? ? ?  ? ?  Start     Ordered  ? 10/01/21 1607  DME Walker rolling  Once       ?Question:  Patient needs a walker to treat with the following condition  Answer:  History of open reduction and internal fixation (ORIF) procedure  ? 10/01/21 1609  ? 10/01/21 1607  DME 3 n 1  Once       ? 10/01/21 1609  ? 10/01/21 1607  DME Bedside commode  Once       ?Question:  Patient needs a bedside commode to treat with the following condition  Answer:  History of open reduction and internal fixation (ORIF) procedure  ? 10/01/21 1609  ? ?  ?  ? ?  ? ? ?Diagnostic Studies: DG C-Arm 1-60 Min-No Report ? ?Result Date: 10/01/2021 ?Fluoroscopy was utilized by the requesting physician.  No radiographic interpretation.  ? ?MYOCARDIAL PERFUSION IMAGING ? ?Result Date: 09/23/2021 ?  The study is normal. The study is low risk.   No ST deviation was noted.   Left ventricular function is normal. End diastolic cavity size is mildly enlarged. End systolic cavity size is normal.   Prior study available for comparison from 07/22/2002. No changes compared to prior study. Findings: Rare PACs in stress and recovery. No evidence of ischemia or infarction. Normal LV function. Conclusions: Stress test is negative. Low risk study. ? ?DG FEMUR MIN 2 VIEWS LEFT ? ?Result Date: 10/01/2021 ?CLINICAL DATA:  Left inter trochanteric destructive bony lesion. EXAM: LEFT FEMUR 2 VIEWS COMPARISON:  CT pelvis 07/29/2021 FINDINGS: Spot images demonstrate IM nail in the proximal femur with 2 interlocking femoral neck screws. The proximal and distal components of the IM nail are  characterized, with 2 interlocking distal screws in the distal femoral metadiaphysis. 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 IMPRESSION: 1. Left femoral IM nail with 2 interlocking femoral neck screws and 2 dista

## 2021-10-02 NOTE — Progress Notes (Signed)
Physical Therapy Treatment ?Patient Details ?Name: Thomas Michael ?MRN: [id number] ?DOB: 1940-08-02 ?Today's Date: 10/02/2021 ? ? ?History of Present Illness 81 yo male with onset of pathological fracture of L femoral neck was admitted on 3/16 for IM nailing.  Pt is referred to PT for further mobility, permitted WBAT.  PMHx:  renal CA, CAD, DM, HTN, hypothyroidism, syncope ? ?  ?PT Comments  ? ? Pt was seen for another therapy session to review steps on regular stairs in rehab gym with good technique this time.  Pt is agreeable to using RLE as stronger leg, which reduced his pain considerably.  Reviewed equipment needs and 3 in one is the main piece of hardware needed.  Follow up with HHPT and assistance with RW and gait belt for mobility with wife and children.  Follow along as ordered for care plan of PT goals.   ?Recommendations for follow up therapy are one component of a multi-disciplinary discharge planning process, led by the attending physician.  Recommendations may be updated based on patient status, additional functional criteria and insurance authorization. ? ?Follow Up Recommendations ? Home health PT ?  ?  ?Assistance Recommended at Discharge Frequent or constant Supervision/Assistance  ?Patient can return home with the following A little help with walking and/or transfers;A little help with bathing/dressing/bathroom;Assistance with cooking/housework;Assist for transportation;Help with stairs or ramp for entrance ?  ?Equipment Recommendations ? BSC/3in1  ?  ?Recommendations for Other Services   ? ? ?  ?Precautions / Restrictions Precautions ?Precautions: Fall ?Restrictions ?Weight Bearing Restrictions: Yes ?LLE Weight Bearing: Weight bearing as tolerated  ?  ? ?Mobility ? Bed Mobility ?Overal bed mobility: Needs Assistance ?Bed Mobility: Supine to Sit, Sit to Supine ?  ?  ?Supine to sit: Mod assist, Min assist ?Sit to supine: Min assist ?  ?General bed mobility comments: min to mod out and min back ?   ? ?Transfers ?Overall transfer level: Needs assistance ?Equipment used: Rolling walker (2 wheels), 1 person hand held assist ?Transfers: Sit to/from Stand ?Sit to Stand: Min assist ?  ?  ?  ?  ?  ?General transfer comment: min assist and adjusted bed to be height of home surface ?  ? ?Ambulation/Gait ?Ambulation/Gait assistance: Min assist ?Gait Distance (Feet): 6 Feet ?Assistive device: Rolling walker (2 wheels), 1 person hand held assist ?Gait Pattern/deviations: Step-through pattern, Step-to pattern, Decreased stride length, Decreased weight shift to left, Wide base of support ?Gait velocity: reduced ?Gait velocity interpretation: <1.31 ft/sec, indicative of household ambulator ?  ?General Gait Details: gait belt provided and despite PT instructions pt wants to use LLE as stronger leg ? ? ?Stairs ?Stairs: Yes ?Stairs assistance: Min assist ?Stair Management: With walker, Forwards ?Number of Stairs: 4 ?General stair comments: pt is painful and becomes impulsive but is rushing after first two steps, using LLE as strong leg with pain worsening ? ? ?Wheelchair Mobility ?  ? ?Modified Rankin (Stroke Patients Only) ?  ? ? ?  ?Balance Overall balance assessment: Needs assistance ?Sitting-balance support: Feet supported ?Sitting balance-Leahy Scale: Good ?  ?  ?Standing balance support: Bilateral upper extremity supported, During functional activity ?Standing balance-Leahy Scale: Poor ?  ?  ?  ?  ?  ?  ?  ?  ?  ?  ?  ?  ?  ? ?  ?Cognition Arousal/Alertness: Awake/alert ?Behavior During Therapy: [facility] for tasks assessed/performed ?Overall Cognitive Status: Within Functional Limits for tasks assessed ?  ?  ?  ?  ?  ?  ?  ?  ?  ?  ?  ?  ?  ?  ?  ?  ?  General Comments: pt has been recruiter for the fashion industry ?  ?  ? ?  ?Exercises   ? ?  ?General Comments General comments (skin integrity, edema, etc.): reviewed stairs with help and pt is demonstrating a better awareness of maintaining the RLE as stronger working leg for  the effort which was less painful ?  ?  ? ?Pertinent Vitals/Pain Pain Assessment ?Pain Assessment: Faces ?Faces Pain Scale: Hurts whole lot ?Pain Location: L hip with WB on RW for steps ?Pain Descriptors / Indicators: Operative site guarding, Guarding, Burning, Aching ?Pain Intervention(s): Limited activity within patient's tolerance, Monitored during session, Premedicated before session, Repositioned, Ice applied  ? ? ?Home Living Family/patient expects to be discharged to:: Private residence ?Living Arrangements: Spouse/significant other ?Available Help at Discharge: Family;Available 24 hours/day ?Type of Home: House ?Home Access: Stairs to enter ?Entrance Stairs-Rails: Can reach both ?Entrance Stairs-Number of Steps: 4 ?  ?Home Layout: One level ?Home Equipment: Conservation officer, nature (2 wheels);Cane - single point;Grab bars - tub/shower ?Additional Comments: agrees he needs a 3 in one commode seat  ?  ?Prior Function    ?  ?  ?   ? ?PT Goals (current goals can now be found in the care plan section) Acute Rehab PT Goals ?Patient Stated Goal: to walk and go home with pain managed ?PT Goal Formulation: With patient/family ?Time For Goal Achievement: 10/16/21 ?Potential to Achieve Goals: Good ? ?  ?Frequency ? ? ? Min 5X/week ? ? ? ?  ?PT Plan    ? ? ?Co-evaluation   ?  ?  ?  ?  ? ?  ?AM-PAC PT "6 Clicks" Mobility   ?Outcome Measure ? Help needed turning from your back to your side while in a flat bed without using bedrails?: A Little ?Help needed moving from lying on your back to sitting on the side of a flat bed without using bedrails?: A Lot ?Help needed moving to and from a bed to a chair (including a wheelchair)?: A Lot ?Help needed standing up from a chair using your arms (e.g., wheelchair or bedside chair)?: A Little ?Help needed to walk in hospital room?: A Lot ?Help needed climbing 3-5 steps with a railing? : A Lot ?6 Click Score: 14 ? ?  ?End of Session Equipment Utilized During Treatment: Gait belt ?Activity  Tolerance: Patient limited by fatigue;Patient limited by pain ?Patient left: in bed;with call bell/phone within reach;with bed alarm set;Other (comment) (ice) ?Nurse Communication: Mobility status;Patient requests pain meds ?PT Visit Diagnosis: Unsteadiness on feet (R26.81);Pain;Difficulty in walking, not elsewhere classified (R26.2) ?Pain - Right/Left: Left ?Pain - part of body: Hip ?  ? ? ?Time: 1937-9024 ?PT Time Calculation (min) (ACUTE ONLY): 32 min ? ?Charges:  $Gait Training: 8-22 mins ?$Therapeutic Activity: 8-22 mins  ?Ramond Dial ?10/02/2021, 3:58 PM ? ?Mee Hives, PT PhD ?Acute Rehab Dept. Number: [facility] [phone] and [place] [phone] ? ? ?

## 2021-10-02 NOTE — Telephone Encounter (Signed)
Message sent to PA to advise ?

## 2021-10-02 NOTE — Plan of Care (Signed)
  Problem: Safety: Goal: Ability to remain free from injury will improve Outcome: Progressing   Problem: Pain Managment: Goal: General experience of comfort will improve Outcome: Progressing   

## 2021-10-03 ENCOUNTER — Other Ambulatory Visit (HOSPITAL_COMMUNITY): Payer: Self-pay

## 2021-10-05 ENCOUNTER — Emergency Department (HOSPITAL_COMMUNITY): Payer: Medicare Other

## 2021-10-05 ENCOUNTER — Encounter (HOSPITAL_COMMUNITY): Payer: Self-pay

## 2021-10-05 ENCOUNTER — Emergency Department (HOSPITAL_COMMUNITY)
Admission: EM | Admit: 2021-10-05 | Discharge: 2021-10-05 | Disposition: A | Discharge status: Home / Self Care | Payer: Medicare Other | Attending: Emergency Medicine | Admitting: Emergency Medicine

## 2021-10-05 ENCOUNTER — Other Ambulatory Visit (HOSPITAL_BASED_OUTPATIENT_CLINIC_OR_DEPARTMENT_OTHER): Payer: Self-pay

## 2021-10-05 ENCOUNTER — Other Ambulatory Visit: Payer: Self-pay

## 2021-10-05 ENCOUNTER — Telehealth: Payer: Self-pay | Admitting: Physician Assistant

## 2021-10-05 DIAGNOSIS — W1839XA Other fall on same level, initial encounter: Secondary | ICD-10-CM | POA: Diagnosis not present

## 2021-10-05 DIAGNOSIS — Z7982 Long term (current) use of aspirin: Secondary | ICD-10-CM | POA: Diagnosis not present

## 2021-10-05 DIAGNOSIS — M25552 Pain in left hip: Secondary | ICD-10-CM | POA: Diagnosis not present

## 2021-10-05 DIAGNOSIS — S7222XA Displaced subtrochanteric fracture of left femur, initial encounter for closed fracture: Secondary | ICD-10-CM | POA: Diagnosis not present

## 2021-10-05 DIAGNOSIS — S72002A Fracture of unspecified part of neck of left femur, initial encounter for closed fracture: Secondary | ICD-10-CM | POA: Diagnosis not present

## 2021-10-05 DIAGNOSIS — R531 Weakness: Secondary | ICD-10-CM | POA: Diagnosis not present

## 2021-10-05 DIAGNOSIS — Z7902 Long term (current) use of antithrombotics/antiplatelets: Secondary | ICD-10-CM | POA: Insufficient documentation

## 2021-10-05 DIAGNOSIS — W19XXXA Unspecified fall, initial encounter: Secondary | ICD-10-CM | POA: Diagnosis not present

## 2021-10-05 DIAGNOSIS — S79912A Unspecified injury of left hip, initial encounter: Secondary | ICD-10-CM | POA: Diagnosis present

## 2021-10-05 DIAGNOSIS — Z7401 Bed confinement status: Secondary | ICD-10-CM | POA: Diagnosis not present

## 2021-10-05 MED ORDER — ONDANSETRON 4 MG PO TBDP
8.0000 mg | ORAL_TABLET | Freq: Once | ORAL | Status: AC
  Administered 2021-10-05: 8 mg via ORAL
  Filled 2021-10-05: qty 2

## 2021-10-05 MED ORDER — OXYCODONE-ACETAMINOPHEN 5-325 MG PO TABS
1.0000 | ORAL_TABLET | Freq: Once | ORAL | Status: AC
  Administered 2021-10-05: 1 via ORAL
  Filled 2021-10-05: qty 1

## 2021-10-05 MED ORDER — FENTANYL CITRATE PF 50 MCG/ML IJ SOSY
50.0000 ug | PREFILLED_SYRINGE | Freq: Once | INTRAMUSCULAR | Status: AC
  Administered 2021-10-05: 50 ug via INTRAMUSCULAR
  Filled 2021-10-05: qty 1

## 2021-10-05 NOTE — Discharge Instructions (Addendum)
You have a mildly displaced ffracture involving the proximal / mid left femur. Fortunately, the surgery you just had was not compromised and actually will help with healing. Take the pain medicines you were prescribed and follow up with Dr. Erlinda Hong this week or early next week for re-evaluation. Return for fever, new falls, new symptoms.  ?

## 2021-10-05 NOTE — ED Provider Notes (Signed)
?[facility] ?Provider Note ? ? ?CSN: [id number] ?Arrival date & time: 10/05/21  1025 ? ?  ? ?History ? ?Chief Complaint  ?Patient presents with  ? Hip Pain  ? ? ?Thomas Michael is a 81 y.o. male. ? ? ?Hip Pain ? ? ?Patient is an 81 year old male presenting due to left hip pain.  He had ORIF surgery on the left hip 10/01/2021, was discharged on 317.  At the time of his discharge he was ambulatory, he was feeling much improved.  Upon arriving home that evening he fell backwards on his buttocks injuring the left hip.  The pain has been worsening since then, states has been taking oxycodone at home but states he was unable to get the prescription he was sent home with the [facility].  He states getting up causes significant pain, he is not having any fevers.  He tried getting in touch with his orthopedic surgeon, they did not call him back so he called his primary who advised to go to the ED for emergent evaluation. ? ?Home Medications ?Prior to Admission medications   ?Medication Sig Start Date End Date Taking? Authorizing Provider  ?acetaminophen (TYLENOL) 500 MG tablet Take 1,000 mg by mouth every 8 (eight) hours as needed for moderate pain.   Yes [provider]  ?axitinib (INLYTA) 5 MG tablet Take 1 tablet (5 mg total) by mouth daily. 08/28/21  Yes Wyatt Portela, MD  ?Cholecalciferol 50 MCG (2000 UT) TABS Take 2,000 Units by mouth daily at 12 noon.   Yes [provider]  ?ciclopirox (PENLAC) 8 % solution Apply 1 application topically daily as needed (fungus (toes)). Apply over nail and surrounding skin. Apply daily as needed over previous coat.   Yes [provider]  ?diphenhydrAMINE (BENADRYL) 25 MG tablet Take 50 mg by mouth at bedtime.   Yes [provider]  ?docusate sodium (COLACE) 100 MG capsule Take 300 mg by mouth daily as needed for mild constipation.   Yes [provider]  ?guaiFENesin (MUCINEX) 600 MG 12 hr tablet Take 1,200  mg by mouth 2 (two) times daily as needed for to loosen phlegm or cough.   Yes [provider]  ?HYDROcodone-acetaminophen (NORCO/VICODIN) 5-325 MG tablet Take 1 tablet by mouth every 6 (six) hours as needed for moderate pain or severe pain.   Yes [provider]  ?ibuprofen (ADVIL) 200 MG tablet Take 400 mg by mouth every 6 (six) hours as needed for mild pain.   Yes [provider]  ?levothyroxine (SYNTHROID, LEVOTHROID) 200 MCG tablet Take 200 mcg by mouth every evening.   Yes [provider]  ?metoprolol (TOPROL-XL) 50 MG 24 hr tablet Take 50 mg by mouth in the morning.   Yes [provider]  ?Multiple Vitamin (MULTIVITAMIN WITH MINERALS) TABS tablet Take 1 tablet by mouth daily at 12 noon.   Yes [provider]  ?niacin 500 MG CR capsule Take 500 mg by mouth daily at 12 noon.   Yes [provider]  ?prochlorperazine (COMPAZINE) 10 MG tablet Take 1 tablet (10 mg total) by mouth every 6 (six) hours as needed for nausea or vomiting. 08/28/21  Yes Wyatt Portela, MD  ?rOPINIRole (REQUIP) 0.25 MG tablet Take 0.25 mg by mouth 2 (two) times daily.   Yes [provider]  ?rosuvastatin (CRESTOR) 20 MG tablet Take 20 mg by mouth in the morning.   Yes [provider]  ?sildenafil (VIAGRA) 100 MG tablet  Take 100 mg by mouth daily as needed for erectile dysfunction.   Yes [provider]  ?trazodone (DESYREL) 300 MG tablet Take 300 mg by mouth at bedtime.   Yes [provider]  ?valsartan-hydrochlorothiazide (DIOVAN-HCT) 160-12.5 MG per tablet Take 1 tablet by mouth in the morning. 05/24/14  Yes [provider]  ?VICTOZA 18 MG/3ML SOPN Inject 1.2 mg into the skin every evening. 03/30/17  Yes [provider]  ?vitamin E 400 UNIT capsule Take 400 Units by mouth daily at 12 noon.   Yes [provider]  ?Accu-Chek FastClix Lancets MISC check CBG 02/07/17   [provider]  ?aspirin 81 MG EC tablet  Take 81 mg by mouth daily. ?Patient not taking: Reported on 10/05/2021    [provider]  ?Blood Glucose Monitoring Suppl (ACCU-CHEK AVIVA PLUS) w/Device KIT check CBG 02/07/17   [provider]  ?clopidogrel (PLAVIX) 75 MG tablet Take 75 mg by mouth in the morning. ?Patient not taking: Reported on 10/05/2021    [provider]  ?Omega-3 Fatty Acids (FISH OIL PO) Take 1 capsule by mouth daily at 12 noon. ?Patient not taking: Reported on 10/05/2021    [provider]  ?oxyCODONE-acetaminophen (PERCOCET) 5-325 MG tablet Take 1 to 2 tablets by mouth every 6 (six) hours as needed. ?Patient not taking: Reported on 10/05/2021 10/02/21   Aundra Dubin, PA-C  ?   ? ?Allergies    ?Promethazine-codeine   ? ?Review of Systems   ?Review of Systems ? ?Physical Exam ?Updated Vital Signs ?BP 132/81 (BP Location: Right Arm)   Pulse 83   Temp 97.8 ?F (36.6 ?C) (Oral)   Resp (!) 22   Ht 5' 6" (1.676 m)   Wt 108 kg   SpO2 97%   BMI 38.41 kg/m?  ?Physical Exam ?Vitals and nursing note reviewed. Exam conducted with a chaperone present.  ?Constitutional:   ?   General: He is not in acute distress. ?   Appearance: Normal appearance.  ?HENT:  ?   Head: Normocephalic and atraumatic.  ?Eyes:  ?   General: No scleral icterus. ?   Extraocular Movements: Extraocular movements intact.  ?   Pupils: Pupils are equal, round, and reactive to light.  ?Cardiovascular:  ?   Rate and Rhythm: Normal rate and regular rhythm.  ?   Pulses: Normal pulses.  ?Musculoskeletal:     ?   General: Tenderness present.  ?   Comments: Compartments are soft.  Staples visualized to the left hip secondary to surgery, there is no purulent discharge or surrounding erythema there would be concerning for new infection.  Tolerate passive range of motion  ?Skin: ?   Capillary Refill: Capillary refill takes less than 2 seconds.  ?   Coloration: Skin is not jaundiced.  ?Neurological:  ?   Mental Status: He is alert. Mental status is at  baseline.  ?   Coordination: Coordination normal.  ? ? ?ED Results / Procedures / Treatments   ?Labs ?(all labs ordered are listed, but only abnormal results are displayed) ?Labs Reviewed - No data to display ? ?EKG ?None ? ?Radiology ?DG HIP UNILAT WITH PELVIS 2-3 VIEWS LEFT ? ?Result Date: 10/05/2021 ?CLINICAL DATA:  Left hip pain after fall EXAM: DG HIP (WITH OR WITHOUT PELVIS) 2-3V LEFT COMPARISON:  None. FINDINGS: Left femoral intramedullary rod with 2 femoral neck screws partially visualized. There appears to be an acute oblique periprosthetic fracture in the proximal diaphysis of the femur extending  beyond the field of view. No hip dislocation. IMPRESSION: Appears to be a partially visualized acute fracture in the proximal diaphysis of the femur extending beyond the field of view. Recommend dedicated femur x-ray. Electronically Signed   By: Ofilia Neas M.D.   On: 10/05/2021 11:58  ? ?DG Femur Min 2 Views Left ? ?Result Date: 10/05/2021 ?CLINICAL DATA:  Recent fall. Recent surgery. History of bony lesion in the left femoral intertrochanteric region. EXAM: LEFT FEMUR 2 VIEWS COMPARISON:  10/01/2021 FINDINGS: There is a left femoral intramedullary nail with 2 proximal screws extending into the left femoral head and neck. There are 2 distal interlocking screws just above the knee. There is a new fracture in the left subtrochanteric region. This is likely an oblique type fracture which is mildly displaced. Left knee is located. Cross-table lateral view is limited but the left hip appears to be located. IMPRESSION: Mildly displaced fracture involving the proximal / mid left femur. Electronically Signed   By: Markus Daft M.D.   On: 10/05/2021 12:38   ? ?Procedures ?Procedures  ? ? ?Medications Ordered in ED ?Medications  ?fentaNYL (SUBLIMAZE) injection 50 mcg (50 mcg Intramuscular Given 10/05/21 1050)  ? ? ?ED Course/ Medical Decision Making/ A&P ?  ?                        ?Medical Decision Making ?Amount and/or  Complexity of Data Reviewed ?Radiology: ordered. ? ?Risk ?Prescription drug management. ? ? ?This patient presents to the ED for concern of left lower extremity pain, this involves an extensive number of treatme

## 2021-10-05 NOTE — ED Triage Notes (Signed)
Pt arrived to ED via EMS from home w/ c/o L hip pain after falling Friday. Pt had surgery Thursday for a fx of the fumur neck and was discharged on Friday. Pt called EMS and Fire saw him Friday and helped pt back up. Pt then called his PCP this morning who told pt to go to the ED. Pt c/o L hip pain w/ radiation down L leg. VSS w/ EMS ?

## 2021-10-05 NOTE — Telephone Encounter (Signed)
Pt called and aid he was discharged this past Friday and then Saturday he fell. He states his pain level is very high and needs to speak with Benita Stabile.  ? ?YT [phone]  ?

## 2021-10-05 NOTE — ED Notes (Signed)
[facility] called for transport to home  ?

## 2021-10-05 NOTE — ED Notes (Signed)
Reviewed discharge instructions with patient. Follow-up care and pain management reviewed. Patient verbalized understanding. Patient A&Ox4, VSS upon discharge.  ?

## 2021-10-06 ENCOUNTER — Emergency Department (HOSPITAL_COMMUNITY): Payer: Medicare Other

## 2021-10-06 ENCOUNTER — Observation Stay (HOSPITAL_COMMUNITY)
Admission: EM | Admit: 2021-10-06 | Discharge: 2021-10-08 | Disposition: A | Discharge status: Home Health | Payer: Medicare Other | Attending: Internal Medicine | Admitting: Internal Medicine

## 2021-10-06 ENCOUNTER — Other Ambulatory Visit: Payer: Self-pay | Admitting: Oncology

## 2021-10-06 ENCOUNTER — Telehealth: Payer: Self-pay | Admitting: *Deleted

## 2021-10-06 ENCOUNTER — Other Ambulatory Visit: Payer: Self-pay

## 2021-10-06 ENCOUNTER — Encounter (HOSPITAL_COMMUNITY): Payer: Self-pay | Admitting: Emergency Medicine

## 2021-10-06 ENCOUNTER — Other Ambulatory Visit (HOSPITAL_BASED_OUTPATIENT_CLINIC_OR_DEPARTMENT_OTHER): Payer: Self-pay

## 2021-10-06 DIAGNOSIS — R509 Fever, unspecified: Secondary | ICD-10-CM | POA: Diagnosis not present

## 2021-10-06 DIAGNOSIS — E119 Type 2 diabetes mellitus without complications: Secondary | ICD-10-CM | POA: Insufficient documentation

## 2021-10-06 DIAGNOSIS — R112 Nausea with vomiting, unspecified: Secondary | ICD-10-CM

## 2021-10-06 DIAGNOSIS — Z87891 Personal history of nicotine dependence: Secondary | ICD-10-CM | POA: Diagnosis not present

## 2021-10-06 DIAGNOSIS — E039 Hypothyroidism, unspecified: Secondary | ICD-10-CM | POA: Diagnosis not present

## 2021-10-06 DIAGNOSIS — F1123 Opioid dependence with withdrawal: Secondary | ICD-10-CM | POA: Diagnosis not present

## 2021-10-06 DIAGNOSIS — R1110 Vomiting, unspecified: Secondary | ICD-10-CM

## 2021-10-06 DIAGNOSIS — Z79899 Other long term (current) drug therapy: Secondary | ICD-10-CM | POA: Diagnosis not present

## 2021-10-06 DIAGNOSIS — C7951 Secondary malignant neoplasm of bone: Secondary | ICD-10-CM | POA: Diagnosis not present

## 2021-10-06 DIAGNOSIS — K5900 Constipation, unspecified: Secondary | ICD-10-CM | POA: Insufficient documentation

## 2021-10-06 DIAGNOSIS — Z7902 Long term (current) use of antithrombotics/antiplatelets: Secondary | ICD-10-CM | POA: Diagnosis not present

## 2021-10-06 DIAGNOSIS — Z20822 Contact with and (suspected) exposure to covid-19: Secondary | ICD-10-CM | POA: Insufficient documentation

## 2021-10-06 DIAGNOSIS — G4733 Obstructive sleep apnea (adult) (pediatric): Secondary | ICD-10-CM | POA: Diagnosis present

## 2021-10-06 DIAGNOSIS — I10 Essential (primary) hypertension: Secondary | ICD-10-CM | POA: Diagnosis not present

## 2021-10-06 DIAGNOSIS — Z85528 Personal history of other malignant neoplasm of kidney: Secondary | ICD-10-CM | POA: Insufficient documentation

## 2021-10-06 DIAGNOSIS — I2510 Atherosclerotic heart disease of native coronary artery without angina pectoris: Secondary | ICD-10-CM | POA: Diagnosis not present

## 2021-10-06 DIAGNOSIS — R2689 Other abnormalities of gait and mobility: Secondary | ICD-10-CM | POA: Diagnosis not present

## 2021-10-06 DIAGNOSIS — F1193 Opioid use, unspecified with withdrawal: Secondary | ICD-10-CM | POA: Diagnosis present

## 2021-10-06 DIAGNOSIS — R9431 Abnormal electrocardiogram [ECG] [EKG]: Secondary | ICD-10-CM | POA: Diagnosis not present

## 2021-10-06 DIAGNOSIS — I700 Atherosclerosis of aorta: Secondary | ICD-10-CM | POA: Diagnosis not present

## 2021-10-06 DIAGNOSIS — Z955 Presence of coronary angioplasty implant and graft: Secondary | ICD-10-CM | POA: Diagnosis not present

## 2021-10-06 DIAGNOSIS — R109 Unspecified abdominal pain: Secondary | ICD-10-CM | POA: Diagnosis not present

## 2021-10-06 DIAGNOSIS — R1111 Vomiting without nausea: Secondary | ICD-10-CM | POA: Diagnosis not present

## 2021-10-06 DIAGNOSIS — G8918 Other acute postprocedural pain: Secondary | ICD-10-CM

## 2021-10-06 LAB — CBC WITH DIFFERENTIAL/PLATELET
Abs Immature Granulocytes: 0.04 10*3/uL (ref 0.00–0.07)
Basophils Absolute: 0 10*3/uL (ref 0.0–0.1)
Basophils Relative: 0 %
Eosinophils Absolute: 0 10*3/uL (ref 0.0–0.5)
Eosinophils Relative: 0 %
HCT: 29.9 % — ABNORMAL LOW (ref 39.0–52.0)
Hemoglobin: 9.6 g/dL — ABNORMAL LOW (ref 13.0–17.0)
Immature Granulocytes: 0 %
Lymphocytes Relative: 5 %
Lymphs Abs: 0.6 10*3/uL — ABNORMAL LOW (ref 0.7–4.0)
MCH: 28.7 pg (ref 26.0–34.0)
MCHC: 32.1 g/dL (ref 30.0–36.0)
MCV: 89.5 fL (ref 80.0–100.0)
Monocytes Absolute: 0.7 10*3/uL (ref 0.1–1.0)
Monocytes Relative: 6 %
Neutro Abs: 10.2 10*3/uL — ABNORMAL HIGH (ref 1.7–7.7)
Neutrophils Relative %: 89 %
Platelets: 307 10*3/uL (ref 150–400)
RBC: 3.34 MIL/uL — ABNORMAL LOW (ref 4.22–5.81)
RDW: 12.5 % (ref 11.5–15.5)
WBC: 11.6 10*3/uL — ABNORMAL HIGH (ref 4.0–10.5)
nRBC: 0 % (ref 0.0–0.2)

## 2021-10-06 LAB — LIPASE, BLOOD: Lipase: 38 U/L (ref 11–51)

## 2021-10-06 LAB — COMPREHENSIVE METABOLIC PANEL
ALT: 43 U/L (ref 0–44)
AST: 39 U/L (ref 15–41)
Albumin: 2.9 g/dL — ABNORMAL LOW (ref 3.5–5.0)
Alkaline Phosphatase: 67 U/L (ref 38–126)
Anion gap: 14 (ref 5–15)
BUN: 18 mg/dL (ref 8–23)
CO2: 27 mmol/L (ref 22–32)
Calcium: 9.1 mg/dL (ref 8.9–10.3)
Chloride: 93 mmol/L — ABNORMAL LOW (ref 98–111)
Creatinine, Ser: 1.21 mg/dL (ref 0.61–1.24)
GFR, Estimated: >60 mL/min (ref >60)
Glucose, Bld: 162 mg/dL — ABNORMAL HIGH (ref 70–99)
Potassium: 4 mmol/L (ref 3.5–5.1)
Sodium: 134 mmol/L — ABNORMAL LOW (ref 135–145)
Total Bilirubin: 1 mg/dL (ref 0.3–1.2)
Total Protein: 7 g/dL (ref 6.5–8.1)

## 2021-10-06 LAB — RESP PANEL BY RT-PCR (FLU A&B, COVID) ARPGX2
Influenza A by PCR: NEGATIVE
Influenza B by PCR: NEGATIVE
SARS Coronavirus 2 by RT PCR: NEGATIVE

## 2021-10-06 MED ORDER — HYDROCODONE-ACETAMINOPHEN 10-300 MG PO TABS
1.0000 | ORAL_TABLET | Freq: Four times a day (QID) | ORAL | 0 refills | Status: DC | PRN
  Filled 2021-10-06: qty 60, 15d supply, fill #0

## 2021-10-06 MED ORDER — IOHEXOL 300 MG/ML  SOLN
100.0000 mL | Freq: Once | INTRAMUSCULAR | Status: AC | PRN
  Administered 2021-10-06: 100 mL via INTRAVENOUS

## 2021-10-06 MED ORDER — OXYCODONE-ACETAMINOPHEN 5-325 MG PO TABS
1.0000 | ORAL_TABLET | Freq: Four times a day (QID) | ORAL | 0 refills | Status: DC | PRN
  Filled 2021-10-06: qty 60, 8d supply, fill #0

## 2021-10-06 MED ORDER — FENTANYL CITRATE PF 50 MCG/ML IJ SOSY
50.0000 ug | PREFILLED_SYRINGE | Freq: Once | INTRAMUSCULAR | Status: AC
  Administered 2021-10-06: 50 ug via INTRAVENOUS
  Filled 2021-10-06: qty 1

## 2021-10-06 MED ORDER — HYDROCODONE-ACETAMINOPHEN 10-325 MG PO TABS
1.0000 | ORAL_TABLET | Freq: Four times a day (QID) | ORAL | 0 refills | Status: DC | PRN
Start: 2021-10-06 — End: 2021-10-08
  Filled 2021-10-06: qty 40, 10d supply, fill #0

## 2021-10-06 MED ORDER — SODIUM CHLORIDE 0.9 % IV BOLUS
1000.0000 mL | Freq: Once | INTRAVENOUS | Status: AC
  Administered 2021-10-06: 1000 mL via INTRAVENOUS

## 2021-10-06 MED ORDER — METOCLOPRAMIDE HCL 5 MG/ML IJ SOLN
10.0000 mg | Freq: Once | INTRAMUSCULAR | Status: AC
Start: 2021-10-06 — End: 2021-10-06
  Administered 2021-10-06: 10 mg via INTRAVENOUS
  Filled 2021-10-06: qty 2

## 2021-10-06 NOTE — Telephone Encounter (Signed)
Returned PC to patient, he called earlier today to say he is in extreme pain from recent leg surgery & femur fx, requesting pain medication.  Dr. Alen Blew sent in rx for oxycodone.  Patient later called to say the oxycodone is causing him to vomit & is requesting hydrocodone.  Per pharmacy at [facility], patient may bring the oxycodone to the pharmacy & dispose of it there.  Per Dr. Alen Blew, if the patient disposes of the oxycodone he may pick up his hydrocodone.  Patient verbalizes understanding of these instructions. ?

## 2021-10-06 NOTE — ED Triage Notes (Signed)
Pt experiencing sudden onset of nausea and vomiting this afternoon.  PCP suggested coming in to make sure he didn't have any infection post surgery to his left femur on Thursday r/t cancer. ? ?Pt has not been receiving chemo or radiation. Pt has recieved immuno therapy prior to surgery.  ?

## 2021-10-06 NOTE — ED Provider Notes (Signed)
?[facility] ?Provider Note ? ? ?CSN: [id number] ?Arrival date & time: 10/06/21  1948 ? ?  ? ?History ? ?Chief Complaint  ?Patient presents with  ? Nausea  ?  vomiting  ? ? ?Thomas Michael is a 81 y.o. male with complaint of new onset nausea and vomiting, with weakness.  Hx of renal cancer with mets.  Recently suffered femoral neck fracture and underwent surgery on 10/01/21.  Discharged home and fell onto hip.  Brought back to ED yesterday and diagnosed with new stable-appearing subtrochanteric fracture.  Discharged home yesterday.  At noon today began having nausea and vomiting and feeling generally weak.  Denies urinary symptoms, diarrhea, or abdominal pain.  Endorses subjective fever and chills, but didn't measure.  Denies shortness of breath.   ? ?The history is provided by the patient and medical records.  ? ?  ? ?Home Medications ?Prior to Admission medications   ?Medication Sig Start Date End Date Taking? Authorizing Provider  ?Accu-Chek FastClix Lancets MISC check CBG 02/07/17   [provider]  ?acetaminophen (TYLENOL) 500 MG tablet Take 1,000 mg by mouth every 8 (eight) hours as needed for moderate pain.    [provider]  ?aspirin 81 MG EC tablet Take 81 mg by mouth daily. ?Patient not taking: Reported on 10/05/2021    [provider]  ?axitinib (INLYTA) 5 MG tablet Take 1 tablet (5 mg total) by mouth daily. 08/28/21   Wyatt Portela, MD  ?Blood Glucose Monitoring Suppl (ACCU-CHEK AVIVA PLUS) w/Device KIT check CBG 02/07/17   [provider]  ?Cholecalciferol 50 MCG (2000 UT) TABS Take 2,000 Units by mouth daily at 12 noon.    [provider]  ?ciclopirox (PENLAC) 8 % solution Apply 1 application topically daily as needed (fungus (toes)). Apply over nail and surrounding skin. Apply daily as needed over previous coat.    [provider]  ?clopidogrel (PLAVIX) 75 MG tablet Take 75 mg by mouth in the morning. ?Patient  not taking: Reported on 10/05/2021    [provider]  ?diphenhydrAMINE (BENADRYL) 25 MG tablet Take 50 mg by mouth at bedtime.    [provider]  ?docusate sodium (COLACE) 100 MG capsule Take 300 mg by mouth daily as needed for mild constipation.    [provider]  ?guaiFENesin (MUCINEX) 600 MG 12 hr tablet Take 1,200 mg by mouth 2 (two) times daily as needed for to loosen phlegm or cough.    [provider]  ?HYDROcodone-acetaminophen (NORCO) 10-325 MG tablet Take 1 tablet by mouth every 6 (six) hours as needed. 10/06/21   Wyatt Portela, MD  ?HYDROcodone-acetaminophen (NORCO/VICODIN) 5-325 MG tablet Take 1 tablet by mouth every 6 (six) hours as needed for moderate pain or severe pain.    [provider]  ?ibuprofen (ADVIL) 200 MG tablet Take 400 mg by mouth every 6 (six) hours as needed for mild pain.    [provider]  ?levothyroxine (SYNTHROID, LEVOTHROID) 200 MCG tablet Take 200 mcg by mouth every evening.    [provider]  ?metoprolol (TOPROL-XL) 50 MG 24 hr tablet Take 50 mg by mouth in the morning.    [provider]  ?Multiple Vitamin (MULTIVITAMIN WITH MINERALS) TABS tablet Take 1 tablet by mouth daily at 12 noon.    [provider]  ?niacin 500 MG CR capsule Take 500 mg by mouth daily at 12 noon.    [provider]  ?Omega-3 Fatty Acids (  FISH OIL PO) Take 1 capsule by mouth daily at 12 noon. ?Patient not taking: Reported on 10/05/2021    [provider]  ?prochlorperazine (COMPAZINE) 10 MG tablet Take 1 tablet (10 mg total) by mouth every 6 (six) hours as needed for nausea or vomiting. 08/28/21   Wyatt Portela, MD  ?rOPINIRole (REQUIP) 0.25 MG tablet Take 0.25 mg by mouth 2 (two) times daily.    [provider]  ?rosuvastatin (CRESTOR) 20 MG tablet Take 20 mg by mouth in the morning.    [provider]  ?sildenafil (VIAGRA) 100 MG tablet Take 100 mg by mouth daily as needed for  erectile dysfunction.    [provider]  ?trazodone (DESYREL) 300 MG tablet Take 300 mg by mouth at bedtime.    [provider]  ?valsartan-hydrochlorothiazide (DIOVAN-HCT) 160-12.5 MG per tablet Take 1 tablet by mouth in the morning. 05/24/14   [provider]  ?VICTOZA 18 MG/3ML SOPN Inject 1.2 mg into the skin every evening. 03/30/17   [provider]  ?vitamin E 400 UNIT capsule Take 400 Units by mouth daily at 12 noon.    [provider]  ?   ? ?Allergies    ?Promethazine-codeine   ? ?Review of Systems   ?Review of Systems  ?Gastrointestinal:  Positive for nausea and vomiting.  ? ?Physical Exam ?Updated Vital Signs ?BP 134/78   Pulse 98   Temp 99 ?F (37.2 ?C) (Oral)   Resp (!) 24   SpO2 90%  ?Physical Exam ?Vitals and nursing note reviewed.  ?Constitutional:   ?   General: He is not in acute distress. ?   Appearance: Normal appearance. He is well-developed. He is obese. He is not ill-appearing or diaphoretic.  ?HENT:  ?   Head: Normocephalic and atraumatic.  ?   Mouth/Throat:  ?   Mouth: Mucous membranes are moist.  ?   Pharynx: Oropharynx is clear.  ?Eyes:  ?   General: No scleral icterus. ?   Conjunctiva/sclera: Conjunctivae normal.  ?Cardiovascular:  ?   Rate and Rhythm: Normal rate and regular rhythm.  ?   Pulses: Normal pulses.  ?   Heart sounds: Normal heart sounds. No murmur heard. ?Pulmonary:  ?   Effort: Pulmonary effort is normal. No respiratory distress.  ?   Breath sounds: Normal breath sounds.  ?Abdominal:  ?   General: Bowel sounds are normal.  ?   Palpations: Abdomen is soft.  ?   Tenderness: There is abdominal tenderness.  ?Musculoskeletal:     ?   General: No swelling.  ?   Cervical back: Neck supple.  ?   Right lower leg: No edema.  ?   Left lower leg: No edema.  ?Skin: ?   General: Skin is warm and dry.  ?   Capillary Refill: Capillary refill takes less than 2 seconds.  ? ?    ?   Comments: Incision sites healing without signs of infection as  above  ?Neurological:  ?   Mental Status: He is alert and oriented to person, place, and time.  ?Psychiatric:     ?   Mood and Affect: Mood normal.  ? ? ?ED Results / Procedures / Treatments   ?Labs ?(all labs ordered are listed, but only abnormal results are displayed) ?Labs Reviewed  ?CBC WITH DIFFERENTIAL/PLATELET - Abnormal; Notable for the following components:  ?    Result Value  ? WBC 11.6 (*)   ? RBC 3.34 (*)   ?  Hemoglobin 9.6 (*)   ? HCT 29.9 (*)   ? Neutro Abs 10.2 (*)   ? Lymphs Abs 0.6 (*)   ? All other components within normal limits  ?COMPREHENSIVE METABOLIC PANEL - Abnormal; Notable for the following components:  ? Sodium 134 (*)   ? Chloride 93 (*)   ? Glucose, Bld 162 (*)   ? Albumin 2.9 (*)   ? All other components within normal limits  ?RESP PANEL BY RT-PCR (FLU A&B, COVID) ARPGX2  ?LIPASE, BLOOD  ?URINALYSIS, ROUTINE W REFLEX MICROSCOPIC  ? ? ?EKG ?None ? ?Radiology ?CT ABDOMEN PELVIS W CONTRAST ? ?Result Date: 10/07/2021 ?CLINICAL DATA:  Abdominal pain, acute, nonlocalized vomoting, abd pain, concern for obstruction. Renal cell carcinoma history EXAM: CT ABDOMEN AND PELVIS WITH CONTRAST TECHNIQUE: Multidetector CT imaging of the abdomen and pelvis was performed using the standard protocol following bolus administration of intravenous contrast. RADIATION DOSE REDUCTION: This exam was performed according to the departmental dose-optimization program which includes automated exposure control, adjustment of the mA and/or kV according to patient size and/or use of iterative reconstruction technique. CONTRAST:  153m OMNIPAQUE IOHEXOL 300 MG/ML  SOLN COMPARISON:  CT abdomen pelvis 08/06/2014, CT abdomen pelvis 07/29/2021 FINDINGS: Lower chest: There is a stable 1.1cm subpleural left lower lobe nodule. There is a 1.3 x 0.8 cm right middle lobe pulmonary nodule. Three-vessel coronary artery calcification. Hepatobiliary: No focal liver abnormality. Status post cholecystectomy. No biliary dilatation.  Pancreas: No focal lesion. Normal pancreatic contour. No surrounding inflammatory changes. No main pancreatic ductal dilatation. Spleen: Normal in size without focal abnormality. Adrenals/Urinary Tract: Redemonstration

## 2021-10-07 ENCOUNTER — Other Ambulatory Visit: Payer: Self-pay

## 2021-10-07 ENCOUNTER — Other Ambulatory Visit (HOSPITAL_BASED_OUTPATIENT_CLINIC_OR_DEPARTMENT_OTHER): Payer: Self-pay

## 2021-10-07 DIAGNOSIS — F1123 Opioid dependence with withdrawal: Secondary | ICD-10-CM | POA: Diagnosis not present

## 2021-10-07 DIAGNOSIS — C7951 Secondary malignant neoplasm of bone: Secondary | ICD-10-CM

## 2021-10-07 DIAGNOSIS — I10 Essential (primary) hypertension: Secondary | ICD-10-CM

## 2021-10-07 DIAGNOSIS — G4733 Obstructive sleep apnea (adult) (pediatric): Secondary | ICD-10-CM

## 2021-10-07 DIAGNOSIS — F1193 Opioid use, unspecified with withdrawal: Secondary | ICD-10-CM | POA: Diagnosis not present

## 2021-10-07 LAB — URINALYSIS, ROUTINE W REFLEX MICROSCOPIC
Bacteria, UA: NONE SEEN
Bilirubin Urine: NEGATIVE
Glucose, UA: NEGATIVE mg/dL
Ketones, ur: 20 mg/dL — AB
Leukocytes,Ua: NEGATIVE
Nitrite: NEGATIVE
Protein, ur: 30 mg/dL — AB
Specific Gravity, Urine: >1.046 — ABNORMAL HIGH (ref 1.005–1.030)
pH: 6 (ref 5.0–8.0)

## 2021-10-07 LAB — GLUCOSE, CAPILLARY: Glucose-Capillary: 134 mg/dL — ABNORMAL HIGH (ref 70–99)

## 2021-10-07 LAB — CBG MONITORING, ED
Glucose-Capillary: 148 mg/dL — ABNORMAL HIGH (ref 70–99)
Glucose-Capillary: 153 mg/dL — ABNORMAL HIGH (ref 70–99)

## 2021-10-07 MED ORDER — MORPHINE SULFATE (PF) 2 MG/ML IV SOLN
2.0000 mg | INTRAVENOUS | Status: DC | PRN
  Administered 2021-10-07 (×2): 2 mg via INTRAVENOUS
  Filled 2021-10-07 (×2): qty 1

## 2021-10-07 MED ORDER — ONDANSETRON HCL 4 MG/2ML IJ SOLN
4.0000 mg | Freq: Four times a day (QID) | INTRAMUSCULAR | Status: DC | PRN
  Filled 2021-10-07: qty 2

## 2021-10-07 MED ORDER — LEVOTHYROXINE SODIUM 100 MCG PO TABS
200.0000 ug | ORAL_TABLET | Freq: Every evening | ORAL | Status: DC
  Administered 2021-10-07 – 2021-10-08 (×2): 200 ug via ORAL
  Filled 2021-10-07 (×2): qty 2

## 2021-10-07 MED ORDER — HEPARIN SODIUM (PORCINE) 5000 UNIT/ML IJ SOLN
5000.0000 [IU] | Freq: Three times a day (TID) | INTRAMUSCULAR | Status: DC
  Administered 2021-10-07 – 2021-10-08 (×4): 5000 [IU] via SUBCUTANEOUS
  Filled 2021-10-07 (×4): qty 1

## 2021-10-07 MED ORDER — TRAZODONE HCL 50 MG PO TABS
300.0000 mg | ORAL_TABLET | Freq: Every day | ORAL | Status: DC
  Administered 2021-10-07: 300 mg via ORAL
  Filled 2021-10-07: qty 6

## 2021-10-07 MED ORDER — ROPINIROLE HCL 1 MG PO TABS
0.5000 mg | ORAL_TABLET | Freq: Three times a day (TID) | ORAL | Status: DC | PRN
  Administered 2021-10-07 – 2021-10-08 (×3): 0.5 mg via ORAL
  Filled 2021-10-07 (×3): qty 1

## 2021-10-07 MED ORDER — SODIUM CHLORIDE 0.9 % IV BOLUS
1000.0000 mL | Freq: Once | INTRAVENOUS | Status: AC
  Administered 2021-10-07: 1000 mL via INTRAVENOUS

## 2021-10-07 MED ORDER — METOPROLOL SUCCINATE ER 50 MG PO TB24
50.0000 mg | ORAL_TABLET | Freq: Every morning | ORAL | Status: DC
  Administered 2021-10-07 – 2021-10-08 (×2): 50 mg via ORAL
  Filled 2021-10-07: qty 1
  Filled 2021-10-07: qty 2

## 2021-10-07 MED ORDER — HYDROCODONE-ACETAMINOPHEN 10-325 MG PO TABS
1.0000 | ORAL_TABLET | Freq: Once | ORAL | Status: AC
  Administered 2021-10-07: 1 via ORAL
  Filled 2021-10-07: qty 1

## 2021-10-07 MED ORDER — HYDROCODONE-ACETAMINOPHEN 5-325 MG PO TABS
1.5000 | ORAL_TABLET | Freq: Four times a day (QID) | ORAL | Status: DC
Start: 2021-10-07 — End: 2021-10-08
  Administered 2021-10-07 – 2021-10-08 (×7): 1.5 via ORAL
  Filled 2021-10-07 (×7): qty 2

## 2021-10-07 MED ORDER — HYDROCHLOROTHIAZIDE 12.5 MG PO TABS
12.5000 mg | ORAL_TABLET | Freq: Every day | ORAL | Status: DC
Start: 2021-10-07 — End: 2021-10-08
  Administered 2021-10-07 – 2021-10-08 (×2): 12.5 mg via ORAL
  Filled 2021-10-07 (×2): qty 1

## 2021-10-07 MED ORDER — INSULIN ASPART 100 UNIT/ML IJ SOLN: 0.0000 [IU] | Freq: Every day | INTRAMUSCULAR | Status: DC

## 2021-10-07 MED ORDER — ACETAMINOPHEN 325 MG PO TABS: 650.0000 mg | ORAL_TABLET | Freq: Four times a day (QID) | ORAL | Status: DC | PRN

## 2021-10-07 MED ORDER — ONDANSETRON HCL 4 MG/2ML IJ SOLN
4.0000 mg | Freq: Once | INTRAMUSCULAR | Status: AC
  Administered 2021-10-07: 4 mg via INTRAVENOUS
  Filled 2021-10-07: qty 2

## 2021-10-07 MED ORDER — VALSARTAN-HYDROCHLOROTHIAZIDE 160-12.5 MG PO TABS: 1.0000 | ORAL_TABLET | Freq: Every morning | ORAL | Status: DC

## 2021-10-07 MED ORDER — HYDROMORPHONE HCL 1 MG/ML IJ SOLN
0.5000 mg | Freq: Once | INTRAMUSCULAR | Status: AC
  Administered 2021-10-07: 0.5 mg via INTRAVENOUS
  Filled 2021-10-07: qty 1

## 2021-10-07 MED ORDER — HYDROCODONE-ACETAMINOPHEN 5-325 MG PO TABS
1.0000 | ORAL_TABLET | Freq: Once | ORAL | Status: DC
  Filled 2021-10-07: qty 1

## 2021-10-07 MED ORDER — LACTATED RINGERS IV SOLN: INTRAVENOUS | Status: DC

## 2021-10-07 MED ORDER — PROPOFOL 1000 MG/100ML IV EMUL
INTRAVENOUS | Status: AC
  Filled 2021-10-07: qty 100

## 2021-10-07 MED ORDER — ACETAMINOPHEN 650 MG RE SUPP
650.0000 mg | Freq: Four times a day (QID) | RECTAL | Status: DC | PRN
Start: 2021-10-07 — End: 2021-10-08

## 2021-10-07 MED ORDER — HYDROCODONE-ACETAMINOPHEN 10-325 MG PO TABS: 1.0000 | ORAL_TABLET | Freq: Four times a day (QID) | ORAL | Status: DC | PRN

## 2021-10-07 MED ORDER — ROSUVASTATIN CALCIUM 20 MG PO TABS
20.0000 mg | ORAL_TABLET | Freq: Every morning | ORAL | Status: DC
  Administered 2021-10-07 – 2021-10-08 (×2): 20 mg via ORAL
  Filled 2021-10-07 (×2): qty 1

## 2021-10-07 MED ORDER — CLOPIDOGREL BISULFATE 75 MG PO TABS
75.0000 mg | ORAL_TABLET | Freq: Every day | ORAL | Status: DC
  Administered 2021-10-07: 75 mg via ORAL
  Filled 2021-10-07: qty 1

## 2021-10-07 MED ORDER — FENTANYL CITRATE PF 50 MCG/ML IJ SOSY
50.0000 ug | PREFILLED_SYRINGE | Freq: Once | INTRAMUSCULAR | Status: AC
  Administered 2021-10-07: 50 ug via INTRAVENOUS
  Filled 2021-10-07: qty 1

## 2021-10-07 MED ORDER — INSULIN ASPART 100 UNIT/ML IJ SOLN
0.0000 [IU] | Freq: Three times a day (TID) | INTRAMUSCULAR | Status: DC
  Administered 2021-10-07: 2 [IU] via SUBCUTANEOUS
  Administered 2021-10-07: 3 [IU] via SUBCUTANEOUS
  Administered 2021-10-08 (×3): 2 [IU] via SUBCUTANEOUS

## 2021-10-07 MED ORDER — ONDANSETRON HCL 4 MG PO TABS
4.0000 mg | ORAL_TABLET | Freq: Four times a day (QID) | ORAL | Status: DC | PRN
Start: 2021-10-07 — End: 2021-10-08
  Administered 2021-10-07: 4 mg via ORAL
  Filled 2021-10-07: qty 1

## 2021-10-07 MED ORDER — SODIUM CHLORIDE 0.9 % IV SOLN
12.5000 mg | Freq: Four times a day (QID) | INTRAVENOUS | Status: DC | PRN
  Administered 2021-10-07: 12.5 mg via INTRAVENOUS
  Filled 2021-10-07 (×2): qty 0.5

## 2021-10-07 MED ORDER — IRBESARTAN 300 MG PO TABS
150.0000 mg | ORAL_TABLET | Freq: Every day | ORAL | Status: DC
  Administered 2021-10-07 – 2021-10-08 (×2): 150 mg via ORAL
  Filled 2021-10-07 (×2): qty 1

## 2021-10-07 NOTE — Assessment & Plan Note (Signed)
Chronic. 

## 2021-10-07 NOTE — Progress Notes (Signed)
Pt vomited immediately after receiving IV zofran and 10 mg po hydrocodone. ?

## 2021-10-07 NOTE — Subjective & Objective (Addendum)
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  Patient states that he had surgery on Thursday, October 01, 2021 for his intramedullary nail.  He was discharged to home with both oxycodone and hydrocodone.  Patient has been taking his hydrocodone after surgery and did not take any oxycodone.  Then on 10/05/2021 he fell.  He was diagnosed with a periprosthetic fracture.  He had run out of his hydrocodone.  He started taking oxycodone but this started making him vomit.  He try to get another prescription for hydrocodone from his pharmacy but due to continued vomiting he came to the ER. ? ?He states that his last dose of hydrocodone was the night he fell on 10/05/2021.  He tried to take his oxycodone and caused him to vomit. ? ?Of note, patient had been cancer free for 5 years before recent diagnosis of metastatic renal cell to his left femur in March 2023. ? ?Came the ER for evaluation.  Lab work is unremarkable.  CT abdomen pelvis was also unremarkable. ? ?He did get 2 doses of IV fentanyl which did seem to make an improvement but 4 hours after his last dose of fentanyl, he started having nausea again. ? ?[facility] hospitalist contacted for consultation. ? ?After further discussion with the patient and his family, he was given 2 options.  First option was to give him an IV opiates such as Dilaudid to see if this would help his nausea and both provide diagnostic information on whether or not his nausea was due to opiate withdrawal.  His second option was to take hydrocodone.  Patient chose to take hydrocodone.  About 3 minutes after he took the hydrocodone, he vomited. ? ?Will be observed in the hospital and started on scheduled  hydrocodone. ? ? ?

## 2021-10-07 NOTE — ED Notes (Signed)
Chart notes previous reaction to promethazine with codeine, RN verified with patient that previous reaction was from combination with codeine. Medication administered and patient reports no reaction at this time. ?

## 2021-10-07 NOTE — Plan of Care (Signed)

## 2021-10-07 NOTE — Assessment & Plan Note (Signed)
Observation medical bed. Give 1 dose IV dilaudid 0.5 mg, then start scheduled hydrocodone 7.5 mg q6h.  Please discharge pt with vicodin 10/325 mg q6h prn. Continue with IVF. ?

## 2021-10-07 NOTE — ED Provider Notes (Signed)
Signout from [employer] at shift change. Briefly, patient presents for nausea and vomiting.  Patient has a history of renal carcinoma status post nephrectomy on the right.  Earlier this year patient was found to have destructive metastatic lesion in the hip.  He had a pathologic fracture which was repaired on 10/01/2021.  He was discharged to home the following day.  Unfortunately he had a fall after returning home and was seen in the emergency department on 3/20.  He had repeat imaging showing a new fracture in the left subtrochanteric region.  Ortho recommendations were obtained while in the emergency department and it was felt patient can be discharged home as he would not need additional surgery. ? ?Yesterday, 10/06/2021, patient developed vomiting.  This was after taking 3 doses of home oxycodone.  He states that previously was on hydrocodone and did well with this.  Also felt generally weak.  Tried taking Compazine that did not help.  No associated diarrhea or urinary symptoms. ?  ?Plan: Completion of work-up including UA, control of nausea and vomiting. ?  ?12:52 AM Reassessment performed. Patient appears stable.  Family at bedside seem very supportive.  Nausea currently improved.  He has been unable to give a urine specimen at this point.  He has received a liter of IV fluids. ? ?CT abdomen and pelvis, ordered by previous team, personally reviewed.  No signs of bowel obstruction.  Postsurgical changes noted in the left hip area. ? ?Most current vital signs reviewed and are as follows: ?BP 134/78   Pulse 98   Temp 99 ?F (37.2 ?C) (Oral)   Resp (!) 24   SpO2 90%  ? ?Plan: Symptom control/UA.  ?  ?4:43 AM patient was doing very well, sleeping.  UA without significant signs of infection, suggestive of dehydration.  He had tolerated some oral fluids and crackers.  Plan was for discharge to home.  I did place home health assessment orders as currently he only has PT, who is supposed to come out of the house  for the first time later today. ? ?Prior to discharge, patient began vomiting again.  At this point, will request consultation and evaluation for vomiting and hydration.  Zofran ordered, additional fluids ordered. ? ?5:07 AM I consulted with Dr. Bridgett Larsson who will come to evaluate patient in the ED and determine if there is a benefit to admission at this time.  ? ?5:35 AM Plan admit for ongoing vomiting. Appreciate hospitalist assistance.  ? ?BP (!) 157/78   Pulse 76   Temp 99 ?F (37.2 ?C) (Oral)   Resp 18   SpO2 94%  ? ?  ?Carlisle Cater, PA-C ?10/07/21 2595 ? ?  ?Merryl Hacker, MD ?10/07/21 [phone] ? ?

## 2021-10-07 NOTE — Progress Notes (Signed)
Brief same-day note ? 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  He was recently discharged after surgery on his left femur fracture with oxycodone/hydrocodone.  On 3/20 he fell and he developed periprosthetic fracture.  He ran out of his hydrocodone.  He was vomiting.  On presentation, he was hemodynamically stable.  CT abdomen/pelvis was unremarkable.  He is currently nonweightbearing.  Patient was admitted for the management of opiate withdrawal.  Started on hydrocodone on a scheduled basis.  Current plan is to monitor him overnight for withdrawal.   ?Patient seen and examined at the bedside this morning.  Hemodynamically stable.  Comfortable without any complaints.  Alert and oriented.   ?We will continue current management.  He will be likely discharge tomorrow. ? ? ?

## 2021-10-07 NOTE — H&P (Addendum)
?History and Physical  ? ? ?Thomas Michael:[id number] DOB: [birth year] DOA: 10/06/2021 ? ?DOS: the patient was seen and examined on 10/06/2021 ? ?PCP: [name], MD  ? ?Patient coming from: Home ? ?I have personally briefly reviewed patient's old medical records in [place] ? 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  Patient states that he had surgery on Thursday, October 01, 2021 for his intramedullary nail.  He was discharged to home with both oxycodone and hydrocodone.  Patient has been taking his hydrocodone after surgery and did not take any oxycodone.  Then on 10/05/2021 he fell.  He was diagnosed with a periprosthetic fracture.  He had run out of his hydrocodone.  He started taking oxycodone but this started making him vomit.  He try to get another prescription for hydrocodone from his pharmacy but due to continued vomiting he came to the ER. ? ?He states that his last dose of hydrocodone was the night he fell on 10/05/2021.  He tried to take his oxycodone and caused him to vomit. ? ?Of note, patient had been cancer free for 5 years before recent diagnosis of metastatic renal cell to his left femur in March 2023. ? ?Came the ER for evaluation.  Lab work is unremarkable.  CT abdomen pelvis was also unremarkable. ? ?He did get 2 doses of IV fentanyl which did seem to make an improvement but 4 hours after his last dose of fentanyl, he started having nausea again. ? ?[facility] hospitalist contacted for consultation. ? ?After further discussion with the patient and his family, he was given 2 options.  First option was to give him an IV opiates such as Dilaudid to see if this would help his nausea and both  provide diagnostic information on whether or not his nausea was due to opiate withdrawal.  His second option was to take hydrocodone.  Patient chose to take hydrocodone.  About 3 minutes after he took the hydrocodone, he vomited. ? ?Will be observed in the hospital and started on scheduled hydrocodone. ? ?  ? ?ED Course: Lab work unremarkable, CT abdomen pelvis negative.  Patient received 2 doses of IV fentanyl 1 at 11 PM then another 1 at 1 AM.  Patient started [place] nausea again about 4 hours after his last dose of fentanyl. ? ?Patient's been without his hydrocodone for at least 36 hours now. ? ?Review of Systems:  ?Review of Systems  ?Constitutional: Negative.   ?HENT: Negative.    ?Eyes: Negative.   ?Respiratory: Negative.    ?Cardiovascular: Negative.   ?Gastrointestinal:  Positive for nausea and vomiting.  ?Genitourinary: Negative.   ?Musculoskeletal:  Positive for joint pain.  ?Skin: Negative.   ?Neurological: Negative.   ?Endo/Heme/Allergies: Negative.   ?Psychiatric/Behavioral: Negative.    ?All other systems reviewed and are negative. ? ?Past Medical History:  ?Diagnosis Date  ? CAD (coronary artery disease)   ? Diabetes mellitus without complication ([place])   ? Dyspnea   ? Facial nerve disorder, unspecified   ? HLD (hyperlipidemia)   ? HTN (hypertension)   ? Jan 2010 He had a 95% mid LAD lesion. There was 99% stenosis at the distal edge of the stented area in the right coronary artery.  The EF was 65%. He had drug-eluting stents placed in both the LAD and the right coronary artery.  ? Hypothyroidism   ? Obesity   ? Other facial nerve disorders   ? Renal cell carcinoma of right kidney (HCC)   ? Syncope   ? Unspecified ptosis of eyelid   ? ? ?Past Surgical History:  ?Procedure Laterality Date  ? CHOLECYSTECTOMY    ? CHOLECYSTECTOMY    ? CORONARY STENT PLACEMENT    ? FEMUR IM NAIL Left 10/01/2021  ? Procedure: left intertrochanteric intramedullary nail;  Surgeon: Leandrew Koyanagi, MD;  Location: [facility];  Service:  Orthopedics;  Laterality: Left;  ? MENISCECTOMY Right   ? NEPHRECTOMY RADICAL Right 2012  ? TONSILLECTOMY    ? ? ? reports that he quit smoking about 53 years ago. His smoking use included cigarettes. He has a 24.00 pack-year smoking history. He has never used smokeless tobacco. He reports current alcohol use of about 2.0 standard drinks per week. He reports that he does not use drugs. ? ?Allergies  ?Allergen Reactions  ? Promethazine-Codeine Other (See Comments)  ?  Pt does not remember reaction  ? ? ?Family History  ?Problem Relation Age of Onset  ? Gallbladder disease Mother   ? Heart disease Father   ? Breast cancer Sister 41  ? High blood pressure Sister   ? ? ?Prior to Admission medications   ?Medication Sig Start Date End Date Taking? Authorizing Provider  ?Accu-Chek FastClix Lancets MISC check CBG 02/07/17  Yes [provider]  ?acetaminophen (TYLENOL) 500 MG tablet Take 1,000 mg by mouth every 8 (eight) hours as needed for moderate pain.   Yes [provider]  ?Blood Glucose Monitoring Suppl (ACCU-CHEK AVIVA PLUS) w/Device KIT check CBG 02/07/17  Yes [provider]  ?Cholecalciferol 50 MCG (2000 UT) TABS Take 2,000 Units by mouth daily at 12 noon.   Yes [provider]  ?ciclopirox (PENLAC) 8 % solution Apply 1 application topically daily as needed (fungus (toes)). Apply over nail and surrounding skin. Apply daily as needed over previous coat.   Yes [provider]  ?clopidogrel (PLAVIX) 75 MG tablet Take 75 mg by mouth at bedtime.   Yes [provider]  ?diphenhydrAMINE (BENADRYL) 25 MG tablet Take 50 mg by mouth at bedtime.   Yes [provider]  ?docusate sodium (COLACE) 100 MG capsule Take 200 mg by mouth daily as needed for mild constipation.   Yes [provider]  ?guaiFENesin (MUCINEX) 600 MG 12 hr tablet Take 1,200 mg by mouth 2 (two) times daily as needed for to loosen phlegm or cough.   Yes [provider]   ?HYDROcodone-acetaminophen (NORCO) 10-325 MG tablet Take 1 tablet by mouth every 6 (six) hours as needed. ?Patient taking differently: Take 1 tablet by mouth every 6 (six) hours as needed for moderate pain. 10/06/21  Yes Wyatt Portela, MD  ?levothyroxine (SYNTHROID, LEVOTHROID) 200 MCG tablet Take 200 mcg by mouth every evening.   Yes [provider]  ?metoprolol (TOPROL-XL) 50 MG 24 hr tablet Take 50 mg by mouth in the morning.   Yes [provider]  ?Multiple Vitamin (MULTIVITAMIN WITH MINERALS) TABS tablet Take 1 tablet by mouth daily at 12 noon.   Yes [provider]  ?niacin 500 MG CR capsule Take 500 mg by mouth daily at 12 noon.   Yes [provider]  ?Omega-3 Fatty Acids (FISH OIL PO) Take 1,000 mg by mouth daily at  12 noon.   Yes [provider]  ?oxycodone (OXY-IR) 5 MG capsule Take 5 mg by mouth every 6 (six) hours as needed for pain.   Yes [provider]  ?prochlorperazine (COMPAZINE) 10 MG tablet Take 1 tablet (10 mg total) by mouth every 6 (six) hours as needed for nausea or vomiting. 08/28/21  Yes Wyatt Portela, MD  ?rOPINIRole (REQUIP) 0.25 MG tablet Take 0.25 mg by mouth 2 (two) times daily.   Yes [provider]  ?rosuvastatin (CRESTOR) 20 MG tablet Take 20 mg by mouth in the morning.   Yes [provider]  ?trazodone (DESYREL) 300 MG tablet Take 300 mg by mouth at bedtime.   Yes [provider]  ?valsartan-hydrochlorothiazide (DIOVAN-HCT) 160-12.5 MG per tablet Take 1 tablet by mouth in the morning. 05/24/14  Yes [provider]  ?VICTOZA 18 MG/3ML SOPN Inject 1.2 mg into the skin every evening. 03/30/17  Yes [provider]  ?vitamin E 400 UNIT capsule Take 400 Units by mouth daily at 12 noon.   Yes [provider]  ?axitinib (INLYTA) 5 MG tablet Take 1 tablet (5 mg total) by mouth daily. 08/28/21   Wyatt Portela, MD  ? ? ?Physical Exam: ?Vitals:  ? 10/07/21 0300 10/07/21 0330 10/07/21  0400 10/07/21 0500  ?BP: (!) 153/84 (!) 150/86 (!) 171/96 (!) 157/78  ?Pulse: 87 90 77 76  ?Resp: (!) _0 ?Temp:      ?TempSrc:      ?SpO2: 91% 93% 90% 94%  ? ? ?Physical Exam ?Vitals and nursing note reviewed

## 2021-10-07 NOTE — Assessment & Plan Note (Signed)
S/p recent IM nail to left femur and more recent periprosthetic fracture of left femur.  Pt c/o on uncontrolled pain on 5 mg vicodin. Did not tolerate oxycodone due to N/V. Pt would prefer to stay with hydrocodone but wants to increase mg dosage of hydrocodone. I think this is a reasonable choice. Pt's wife and dtr agreeable and will monitor his medication usage. ?

## 2021-10-08 ENCOUNTER — Telehealth: Payer: Self-pay | Admitting: Orthopaedic Surgery

## 2021-10-08 ENCOUNTER — Other Ambulatory Visit (HOSPITAL_BASED_OUTPATIENT_CLINIC_OR_DEPARTMENT_OTHER): Payer: Self-pay

## 2021-10-08 DIAGNOSIS — Z7401 Bed confinement status: Secondary | ICD-10-CM | POA: Diagnosis not present

## 2021-10-08 DIAGNOSIS — F1123 Opioid dependence with withdrawal: Secondary | ICD-10-CM | POA: Diagnosis not present

## 2021-10-08 DIAGNOSIS — S79929A Unspecified injury of unspecified thigh, initial encounter: Secondary | ICD-10-CM | POA: Diagnosis not present

## 2021-10-08 DIAGNOSIS — F1193 Opioid use, unspecified with withdrawal: Secondary | ICD-10-CM | POA: Diagnosis not present

## 2021-10-08 LAB — GLUCOSE, CAPILLARY
Glucose-Capillary: 124 mg/dL — ABNORMAL HIGH (ref 70–99)
Glucose-Capillary: 141 mg/dL — ABNORMAL HIGH (ref 70–99)
Glucose-Capillary: 121 mg/dL — ABNORMAL HIGH (ref 70–99)

## 2021-10-08 MED ORDER — HYDROCODONE-ACETAMINOPHEN 10-325 MG PO TABS: 1.0000 | ORAL_TABLET | Freq: Four times a day (QID) | ORAL | 0 refills | Status: DC | PRN

## 2021-10-08 MED ORDER — POLYETHYLENE GLYCOL 3350 17 G PO PACK: 17.0000 g | PACK | Freq: Every day | ORAL | 0 refills | Status: AC

## 2021-10-08 MED ORDER — SENNA 8.6 MG PO TABS: 1.0000 | ORAL_TABLET | Freq: Two times a day (BID) | ORAL | 0 refills | Status: AC

## 2021-10-08 NOTE — Evaluation (Signed)
Physical Therapy Evaluation ?Patient Details ?Name: Thomas Michael ?MRN: [id number] ?DOB: 1940-09-26 ?Today's Date: 10/08/2021 ? ?History of Present Illness ? Patient is a 81 year old male admittted 3/21 presented to the ER with nausea, vomiting. 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  On 3/20 he fell and he developed periprosthetic fracture.  He ran out of his hydrocodone and has been trying to get pain management seeing MD again.  PMH:  morbid obesity, OSA, hypertension  ?Clinical Impression ? Pt admitted with above diagnosis. Pt was able to perform a scoot pivot transfer to a drop arm recliner with min guard to min assist. Pt reports pain minimal with this movement and was surprised he did not hurt much.  Discussed going home with a hospital bed and wheelchair and pt was interested and engaged in plan.  Did discuss how to arrange the room with pt if the drop arm commode doesn't get exchanged with 3N1 against wall and side of bed up against it.  Pt can perform transfers to bedside commode on that side and then access wheelchair from the other side of bed. Pt understands.  Messaged MD regarding home needs to make pt more successful since he is now NWB left LE.  CM also aware of pt needs.  Will follow acutely while pt is here.   Pt currently with functional limitations due to the deficits listed below (see PT Problem List). Pt will benefit from skilled PT to increase their independence and safety with mobility to allow discharge to the venue listed below.      ?   ? ?Recommendations for follow up therapy are one component of a multi-disciplinary discharge planning process, led by the attending physician.  Recommendations may be updated based on patient status, additional functional criteria and insurance authorization. ? ?Follow Up  Recommendations Home health PT ([facility]) ? ?  ?Assistance Recommended at Discharge Intermittent Supervision/Assistance  ?Patient can return home with the following ? A little help with walking and/or transfers;A little help with bathing/dressing/bathroom;Assistance with cooking/housework;Help with stairs or ramp for entrance ? ?  ?Equipment Recommendations Wheelchair [phone] lightweight wheelchair with desk armrests, elevating legrests and anti tippers);Wheelchair cushion (20 x18 pressure relieving cushion);Hospital bed (drop arm commode (could equipment company exchange the one he hasnt used))  ?Recommendations for Other Services ?    ?  ?Functional Status Assessment Patient has had a recent decline in their functional status and demonstrates the ability to make significant improvements in function in a reasonable and predictable amount of time.  ? ?  ?Precautions / Restrictions Precautions ?Precautions: Fall ?Restrictions ?Weight Bearing Restrictions: Yes ?LLE Weight Bearing: Non weight bearing  ? ?  ? ?Mobility ? Bed Mobility ?Overal bed mobility: Needs Assistance ?Bed Mobility: Supine to Sit ?  ?  ?Supine to sit: Min assist ?  ?  ?General bed mobility comments: A little assist to hold weight of left LE as well as a little assist to bring hips to EOB with use of pad ?  ? ?Transfers ?Overall transfer level: Needs assistance ?  ?Transfers: Bed to chair/wheelchair/BSC ?Sit to Stand: Min assist, From elevated surface ?  ?  ?  ?  ? Lateral/Scoot Transfers: Min assist, From elevated surface ?General transfer comment: min assist with pt scooting to the drop arm recliner to his right using pad  but pt doing most of the activity and PT cuing. ?  ? ?Ambulation/Gait ?  ?  ?  ?  ?  ?  ?  ?General Gait Details: Unable as pt is NWB left LE and very scared to stand ? ?Stairs ?  ?  ?  ?  ?  ? ?Wheelchair Mobility ?  ? ?Modified Rankin (Stroke Patients Only) ?  ? ?  ? ?Balance Overall balance assessment: Needs  assistance ?Sitting-balance support: No upper extremity supported, Feet supported ?Sitting balance-Leahy Scale: Fair ?  ?  ?  ?  ?  ?  ?  ?  ?  ?  ?  ?  ?  ?  ?  ?  ?   ? ? ? ?Pertinent Vitals/Pain Pain Assessment ?Pain Assessment: Faces ?Faces Pain Scale: Hurts little more ?Pain Location: left femur ?Pain Descriptors / Indicators: Aching, Discomfort ?Pain Intervention(s): Limited activity within patient's tolerance, Monitored during session, Premedicated before session, Repositioned  ? ? ?Home Living Family/patient expects to be discharged to:: Private residence ?Living Arrangements: Spouse/significant other ?Available Help at Discharge: Family;Available 24 hours/day (daughter and wife) ?Type of Home: House ?Home Access: Stairs to enter ?Entrance Stairs-Rails: None ?Entrance Stairs-Number of Steps: 1 ?  ?Home Layout: Two level;Able to live on main level with bedroom/bathroom ?Home Equipment: Cane - single point;Grab bars - tub/shower;BSC/3in1;Rollator (4 wheels) ?Additional Comments: Was sleeping in recliner PTA  ?  ?Prior Function Prior Level of Function : Needs assist ?  ?  ?  ?  ?  ?  ?Mobility Comments: has needed assist since fall.  Family took him home and he stayed in recliner as he was in horrible pain. ?ADLs Comments: Family has been assisting since fall ?  ? ? ?Hand Dominance  ? Dominant Hand: Right ? ?  ?Extremity/Trunk Assessment  ? Upper Extremity Assessment ?Upper Extremity Assessment: Defer to OT evaluation ?  ? ?Lower Extremity Assessment ?Lower Extremity Assessment: LLE deficits/detail ?LLE Deficits / Details: pain limits but can move it slightly if PT holds the weight of the leg ?LLE: Unable to fully assess due to pain ?LLE Coordination: decreased gross motor ?  ? ?Cervical / Trunk Assessment ?Cervical / Trunk Assessment: Kyphotic  ?Communication  ? Communication: No difficulties  ?Cognition Arousal/Alertness: Awake/alert ?Behavior During Therapy: [facility] for tasks assessed/performed ?Overall  Cognitive Status: Within Functional Limits for tasks assessed ?  ?  ?  ?  ?  ?  ?  ?  ?  ?  ?  ?  ?  ?  ?  ?  ?General Comments: pt has been recruiter for the fashion industry ?  ?  ? ?  ?General Comments   ? ?  ?Exercises General Exercises - Lower Extremity ?Ankle Circles/Pumps: AROM, Both, 10 reps, Supine ?Long Arc Quad: AROM, Right, 10 reps, Seated  ? ?Assessment/Plan  ?  ?PT Assessment Patient needs continued PT services  ?PT Problem List Decreased strength;Decreased range of motion;Decreased activity tolerance;Decreased balance;Decreased mobility;Decreased coordination;Decreased knowledge of use of DME;Decreased skin integrity;Pain ? ?   ?  ?PT Treatment Interventions DME instruction;Gait training;Stair training;Functional mobility training;Therapeutic activities;Therapeutic exercise;Balance training;Neuromuscular re-education;Patient/family education   ? ?PT Goals (Current goals can be found in the Care Plan section)  ?Acute Rehab PT Goals ?Patient Stated Goal: go home with pain managed ?PT Goal Formulation: With patient ?Time For Goal Achievement: 10/22/21 ?Potential to Achieve Goals: Good ? ?  ?Frequency Min 5X/week ?  ? ? ?Co-evaluation   ?  ?  ?  ?  ? ? ?  ?  AM-PAC PT "6 Clicks" Mobility  ?Outcome Measure Help needed turning from your back to your side while in a flat bed without using bedrails?: A Little ?Help needed moving from lying on your back to sitting on the side of a flat bed without using bedrails?: A Little ?Help needed moving to and from a bed to a chair (including a wheelchair)?: A Little ?Help needed standing up from a chair using your arms (e.g., wheelchair or bedside chair)?: Total ?Help needed to walk in hospital room?: Total ?Help needed climbing 3-5 steps with a railing? : Total ?6 Click Score: 12 ? ?  ?End of Session Equipment Utilized During Treatment: Gait belt ?Activity Tolerance: Patient tolerated treatment well ?Patient left: in chair;with call bell/phone within reach;with chair  alarm set ?Nurse Communication: Mobility status ?PT Visit Diagnosis: Unsteadiness on feet (R26.81);Difficulty in walking, not elsewhere classified (R26.2);Muscle weakness (generalized) (M62.81);Pain ?Pain - Right/Left: Left ?Pa

## 2021-10-08 NOTE — TOC Transition Note (Addendum)
Transition of Care (TOC) - CM/SW Discharge Note ? ? ?Patient Details  ?Name: Thomas Michael ?MRN: [id number] ?Date of Birth: [birth year] ? ?Transition of Care (TOC) CM/SW Contact:  ?Cyndi Bender, RN ?Phone Number: ?10/08/2021, 12:07 PM ? ? ?Clinical Narrative:    ?Orders for Home Health and DME. ?Spoke to patient's wife regarding transition needs. ?Choice offered an patient defers to [facility] to find [facility] agency.  ?Tommi Rumps with [facility] has accepted referral ?Patient is agreeable to use in house provider, [facility], for DME needs.  ?Spoke to Stewart with [facility] and hospital bed, wheelchair and dropdown [facility] ordered to be delivered to the house.  ?Wife will call RNCM when bed has arrived. ?RNCM will call [facility] for transportation once bed is delivered. Wife will call RNCM when bed has arrived. ? ?98 [facility] called ? ?Address, phone number and PCP confirmed.  ? ? ?Final next level of care: [place] ?Barriers to Discharge: Barriers Resolved ? ? ?Patient Goals and CMS Choice ?Patient states their goals for this hospitalization and ongoing recovery are:: go home ?CMS Medicare.gov Compare Post Acute Care list provided to:: Patient Represenative (must comment) (wife) ?Choice offered to / list presented to : Spouse ? ?Discharge Placement ?  ?           ?  ?  ?  ?  ? ?Discharge Plan and Services ?  ?  ?           ?DME Arranged: 3-N-1, Wheelchair manual, Hospital bed ?DME Agency: [facility] ?Date DME Agency Contacted: 10/08/21 ?Time DME Agency Contacted: 1200 ?Representative spoke with at DME Agency: Freda Munro ?HH Arranged: PT, OT, Nurse's Aide ?[place] Agency: [place] ?Date HH Agency Contacted: 10/08/21 ?Time [place]: 1200 ?Representative spoke with at [place]: Tommi Rumps ? ?Social Determinants of Health (SDOH) Interventions ?  ? ? ?Readmission Risk Interventions ?   ? View : No data to display.  ?  ?  ?  ? ? ? ? ? ?

## 2021-10-08 NOTE — Discharge Summary (Signed)
\ ?Physician Discharge Summary  ?Thomas Michael LZJ:[id number] DOB: November 11, 1940 DOA: 10/06/2021 ? ?PCP: [name], MD ? ?Admit date: 10/06/2021 ?Discharge date: 10/08/2021 ? ?Admitted From: Home ?Disposition:  Home ? ?Discharge Condition:Stable ?CODE STATUS:DNR ?Diet recommendation: Heart Healthy  ? ?Brief/Interim Summary: ? 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  He was recently discharged after surgery on his left femur fracture with oxycodone/hydrocodone.  On 3/20 he fell and he developed periprosthetic fracture. On presentation, he was hemodynamically stable.  CT abdomen/pelvis was unremarkable.    Patient was admitted for the management of opiate withdrawal.  Started on hydrocodone on a scheduled basis.  Pain and nausea well controlled.  We also communicated with orthopedics about his management plan.  Discussed with Lenise Arena, PA for orthopedics.  As per Ortho, there is no plan for surgical intervention and he has been recommended to follow-up with an outpatient. He is weightbearing as tolerated.  Discussed this clearly with the family and family understand the plan.  PT/OT recommended home health and other equipment for home.  Patient is medically stable for discharge.  He needs to follow-up with orthopedics and oncology soon as possible. ? ?Following problems were addressed during his hospitalization: ? ? ?Opiate withdrawal  ?Started on hydrocodone/Tylenol.  Patient will be discharge to home on hydrocodone/Tylenol .  He recently ran out of this medication.  He needs to follow-up with oncology for further refill ? ?metastatic cancer to bone  ?S/p recent IM nail to left femur but he fell and developed periprosthetic fracture of left femur.  Had a long discussion with orthopedics.  Orthopedics recommending to follow-up as an  outpatient, no need of intervention.  He can bear the weight as tolerated.  PT/OT recommending home health, hospital bed, wheelchair. ?We recommend follow-up with orthopedics and oncology as soon as possible ?  ?Constipation ?Continue bowel regimen ? ?Hypothyroidism ?Continue levothyroxine ? ?Hypertension ?Continue home medications. ? ?Hyperlipidemia ?On Crestor ?  ?Morbid obesity ([place]) ?BMI of 38 ? ? ?Discharge Diagnoses:  ?Principal Problem: ?  Opiate withdrawal ([place]) ?Active Problems: ?  Metastatic cancer to bone [facility]) ?  Morbid obesity ([place]) ?  OBSTRUCTIVE SLEEP APNEA ?  Essential hypertension ? ? ? ?Discharge Instructions ? ?Discharge Instructions   ? ? Diet - low sodium heart healthy   Complete by: As directed ?  ? Discharge instructions   Complete by: As directed ?  ? 1)Please take prescribed medications as instructed ?2)Follow up with orthopedics and oncology as soon as possible  ? Face-to-face encounter (required for Medicare/Medicaid patients)   Complete by: As directed ?  ? I Carlisle Cater certify that this patient is under my care and that I, or a nurse practitioner or physician's assistant working with me, had a face-to-face encounter that meets the physician face-to-face encounter requirements with this patient on 10/07/2021. The encounter with the patient was in whole, or in part for the following medical condition(s) which is the primary reason for home health care (List medical condition): hip and femur fracture  ? The encounter with the patient was in whole, or in part, for the following medical condition, which is the primary reason for home health care: hip fracture, recent surgery, pain management, vomiting  ? I certify that, based on my findings, the following services are medically necessary home health services:  Physical therapy ?  Nursing  ?  ? Reason for Medically Necessary Home Health Services:  Therapy- Home Adaptation to Facilitate Safety ?Therapy- Personnel officer, Scientist, research (physical sciences) ?Skilled Nursing- Change/Decline in Patient Status ?Skilled Nursing- Post-Surgical Wound Assessment and Care  ?  ? My clinical findings support the need for the above services: Pain interferes with ambulation/mobility  ? Further, I certify that my clinical findings support that this patient is homebound due to: Unable to leave home safely without assistance  ? Home Health   Complete by: As directed ?  ? To provide the following care/treatments:  OT ?RN ?Home Health Aide  ?  ? Increase activity slowly   Complete by: As directed ?  ? No wound care   Complete by: As directed ?  ? ?  ? ?Allergies as of 10/08/2021   ? ?   Reactions  ? Promethazine-codeine Other (See Comments)  ? Pt does not remember reaction  ? ?  ? ?  ?Medication List  ?  ? ?TAKE these medications   ? ?Accu-Chek Aviva Plus w/Device Kit ?check CBG ?  ?Accu-Chek FastClix Lancets Misc ?check CBG ?  ?acetaminophen 500 MG tablet ?Commonly known as: TYLENOL ?Take 1,000 mg by mouth every 8 (eight) hours as needed for moderate pain. ?  ?Cholecalciferol 50 MCG (2000 UT) Tabs ?Take 2,000 Units by mouth daily at 12 noon. ?  ?ciclopirox 8 % solution ?Commonly known as: PENLAC ?Apply 1 application topically daily as needed (fungus (toes)). Apply over nail and surrounding skin. Apply daily as needed over previous coat. ?  ?clopidogrel 75 MG tablet ?Commonly known as: PLAVIX ?Take 75 mg by mouth at bedtime. ?  ?diphenhydrAMINE 25 MG tablet ?Commonly known as: BENADRYL ?Take 50 mg by mouth at bedtime. ?  ?docusate sodium 100 MG capsule ?Commonly known as: COLACE ?Take 200 mg by mouth daily as needed for mild constipation. ?  ?FISH OIL PO ?Take 1,000 mg by mouth daily at 12 noon. ?  ?guaiFENesin 600 MG 12 hr tablet ?Commonly known as: [place] ?Take 1,200 mg by mouth 2 (two) times daily as needed for to loosen phlegm or cough. ?  ?HYDROcodone-acetaminophen 10-325 MG tablet ?Commonly known as: NORCO ?Take 1 tablet by mouth every 6 (six) hours as  needed. ?What changed:  ?when to take this ?reasons to take this ?  ?Inlyta 5 MG tablet ?Generic drug: axitinib ?Take 1 tablet (5 mg total) by mouth daily. ?  ?levothyroxine 200 MCG tablet ?Commonly known as: SYNTHROID ?Take 200 mcg by mouth every evening. ?  ?metoprolol succinate 50 MG 24 hr tablet ?Commonly known as: TOPROL-XL ?Take 50 mg by mouth in the morning. ?  ?multivitamin with minerals Tabs tablet ?Take 1 tablet by mouth daily at 12 noon. ?  ?niacin 500 MG CR capsule ?Take 500 mg by mouth daily at 12 noon. ?  ?polyethylene glycol 17 g packet ?Commonly known as: MiraLax Mix-In Pax ?Take 17 g by mouth daily. ?  ?prochlorperazine 10 MG tablet ?Commonly known as: COMPAZINE ?Take 1 tablet (10 mg total) by mouth every 6 (six) hours as needed for nausea or vomiting. ?  ?rOPINIRole 0.25 MG tablet ?Commonly known as: REQUIP ?Take 0.25 mg by mouth 2 (two) times daily. ?  ?rosuvastatin 20 MG tablet ?Commonly known as: CRESTOR ?Take 20 mg by mouth in the morning. ?  ?senna 8.6 MG Tabs tablet ?Commonly known as: SENOKOT ?Take 1 tablet (8.6 mg total) by mouth 2 (two) times daily. ?  ?trazodone 300  MG tablet ?Commonly known as: DESYREL ?Take 300 mg by mouth at bedtime. ?  ?valsartan-hydrochlorothiazide 160-12.5 MG tablet ?Commonly known as: DIOVAN-HCT ?Take 1 tablet by mouth in the morning. ?  ?Victoza 18 MG/3ML Sopn ?Generic drug: liraglutide ?Inject 1.2 mg into the skin every evening. ?  ?vitamin E 180 MG (400 UNITS) capsule ?Take 400 Units by mouth daily at 12 noon. ?  ? ?  ? ?  ?  ? ? ?  ?Durable Medical Equipment  ?(From admission, onward)  ?  ? ? ?  ? ?  Start     Ordered  ? 10/08/21 1121  For home use only DME Hospital bed  Once       ?Question Answer Comment  ?Length of Need Lifetime   ?Patient has (list medical condition): morbid obesity, OSA, hypertension, metastatic renal cell cancer to the left femur status post recent IM nail   ?The above medical condition requires: Patient requires the ability to reposition  frequently   ?Head must be elevated greater than: 30 degrees   ?Bed type Semi-electric   ?Support Surface: Gel Overlay   ?  ? 10/08/21 1120  ? 10/08/21 1028  For home use only DME standard manual wheelchair with seat

## 2021-10-08 NOTE — Care Management Obs Status (Signed)
MEDICARE OBSERVATION STATUS NOTIFICATION ? ? ?Patient Details  ?Name: Thomas Michael ?MRN: [id number] ?Date of Birth: 27-Nov-1940 ? ? ?Medicare Observation Status Notification Given:  Yes ? ? ? ?Cyndi Bender, RN ?10/08/2021, 4:06 PM ?

## 2021-10-08 NOTE — Telephone Encounter (Signed)
Pt called requesting a call from Dr. Erlinda Hong. Pt states he has been having complications since surgery and pt is admitted in [facility]. Pt is asking for  call back right away. Please call pt at [phone]. ?

## 2021-10-08 NOTE — Progress Notes (Addendum)
Thomas Michael to be D/C'd Home with home health per MD order.  Discussed with the patient and all questions fully answered. ? ?IV catheter discontinued intact. Site without signs and symptoms of complications. Dressing and pressure applied. ? ?An After Visit Summary was printed and given to the patient. Patient prescriptions sent to pharmacy. ? ?D/c education completed with patient/family including follow up instructions, medication list, d/c activities limitations if indicated, with other d/c instructions as indicated by MD - patient able to verbalize understanding, all questions fully answered.  ? ?Patient instructed to return to ED, call 911, or call MD for any changes in condition.  ? ?Patient escorted via stretcher, and D/C home via non emergency ambulance. ? ?Patient given scheduled pain medication before discharging home to tolerate the ride. ? ?Manuella Ghazi ?10/08/2021 5:52 PM  ?

## 2021-10-08 NOTE — Evaluation (Signed)
Occupational Therapy Evaluation ?Patient Details ?Name: Thomas Michael ?MRN: [id number] ?DOB: [birth year] ?Today's Date: 10/08/2021 ? ? ?History of Present Illness 81 year old male admittted 3/21 presented to the ER with nausea, vomiting. 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 Recently discharged after surgery on his left femur fracture with oxycodone/hydrocodone.  On 3/20 he fell and he developed periprosthetic fracture.  He ran out of his hydrocodone and has been trying to get pain management seeing MD again.  PMH:  morbid obesity, OSA, hypertension  ? ?Clinical Impression ?  ?PTA, pt was at home with wife and requiring assistance for BADLs after recent fall. Pt currently requiring Min A for UB ADLs, Max A for LB ADLs, and Min A for lateral scoot from drop arm recliner. Providing pt with education on compensatory techniques for LB ADLs, toilet hygiene, lateral scoots, and anterior/posterior transfers. Pt verbalized and demonstrated understanding. Pt would benefit from further acute OT to facilitate safe dc. Recommend dc to home with HHOT for further OT to optimize safety, independence with ADLs, and return to PLOF.   ?   ? ?Recommendations for follow up therapy are one component of a multi-disciplinary discharge planning process, led by the attending physician.  Recommendations may be updated based on patient status, additional functional criteria and insurance authorization.  ? ?Follow Up Recommendations ? Home health OT  ?  ?Assistance Recommended at Discharge Frequent or constant Supervision/Assistance  ?Patient can return home with the following A lot of help with bathing/dressing/bathroom;A little help with walking and/or transfers ? ?  ?Functional Status Assessment ? Patient has had a recent decline in their functional status and demonstrates the ability to make significant improvements in function in a reasonable and predictable  amount of time.  ?Equipment Recommendations ? Other (comment);Wheelchair (measurements OT);Wheelchair cushion (measurements OT);Hospital bed (Drop arm BSC)  ?  ?Recommendations for Other Services PT consult ? ? ?  ?Precautions / Restrictions Precautions ?Precautions: Fall ?Restrictions ?Weight Bearing Restrictions: Yes ?LLE Weight Bearing: Non weight bearing  ? ?  ? ?Mobility Bed Mobility ?Overal bed mobility: Needs Assistance ?Bed Mobility: Sit to Supine ?  ?  ?  ?Sit to supine: Min assist ?  ?General bed mobility comments: Min A for managing LLE ?  ? ?Transfers ?Overall transfer level: Needs assistance ?  ?Transfers: Bed to chair/wheelchair/BSC ?  ?  ?  ?  ?  ? Lateral/Scoot Transfers: Min assist, From elevated surface ?General transfer comment: Min A for bringing L hips over EOB in recturn to bed. Cues for weight shift forward instead of back ?  ? ?  ?Balance Overall balance assessment: Needs assistance ?Sitting-balance support: No upper extremity supported, Feet supported ?Sitting balance-Leahy Scale: Fair ?  ?  ?  ?  ?  ?  ?  ?  ?  ?  ?  ?  ?  ?  ?  ?  ?   ? ?ADL either performed or assessed with clinical judgement  ? ?ADL Overall ADL's : Needs assistance/impaired ?Eating/Feeding: Set up;Sitting ?  ?Grooming: Set up;Sitting ?  ?Upper Body Bathing: Minimal assistance;Sitting ?  ?Lower Body Bathing: Maximal assistance;Sitting/lateral leans;Bed level ?  ?Upper Body Dressing : Minimal assistance;Sitting ?  ?Lower Body Dressing: Maximal assistance;Bed level;Sitting/lateral leans ?  ?Toilet Transfer: Minimal assistance;Transfer board ?Toilet Transfer Details (indicate cue type and reason): lateral scoot to bed from drop arm recliner with MIn A for elevating L hip over EOB. Also  providng demonstration on use of anterior/posterior transfer to [facility] if unable to gain drop arn BSC ?  ?Toileting - Clothing Manipulation Details (indicate cue type and reason): Educating on lateral leaning for peri care ?  ?  ?Functional  mobility during ADLs: Minimal assistance (lateral scoot) ?General ADL Comments: Pt presenting with decreased activity tolerance and balance. Performing latyeral scoot to return to bed with MIn A. Demonstratign good following of cues for problem solving  ? ? ? ?Vision Baseline Vision/History: 1 Wears glasses ?   ?   ?Perception   ?  ?Praxis   ?  ? ?Pertinent Vitals/Pain Pain Assessment ?Pain Assessment: Faces ?Faces Pain Scale: Hurts little more ?Pain Location: left femur ?Pain Descriptors / Indicators: Aching, Discomfort ?Pain Intervention(s): Monitored during session, Limited activity within patient's tolerance, Repositioned  ? ? ? ?Hand Dominance Right ?  ?Extremity/Trunk Assessment Upper Extremity Assessment ?Upper Extremity Assessment: Overall WFL for tasks assessed ?  ?Lower Extremity Assessment ?Lower Extremity Assessment: Defer to PT evaluation ?LLE Deficits / Details: pain limits but can move it slightly if PT holds the weight of the leg ?LLE: Unable to fully assess due to pain ?LLE Coordination: decreased gross motor ?  ?Cervical / Trunk Assessment ?Cervical / Trunk Assessment: Kyphotic ?  ?Communication Communication ?Communication: No difficulties ?  ?Cognition Arousal/Alertness: Awake/alert ?Behavior During Therapy: [facility] for tasks assessed/performed ?Overall Cognitive Status: Within Functional Limits for tasks assessed ?  ?  ?  ?  ?  ?  ?  ?  ?  ?  ?  ?  ?  ?  ?  ?  ?General Comments: Very motivated ?  ?  ?General Comments  VSS ? ?  ?Exercises   ?  ?Shoulder Instructions    ? ? ?Home Living Family/patient expects to be discharged to:: Private residence ?Living Arrangements: Spouse/significant other ?Available Help at Discharge: Family;Available 24 hours/day (daughter and wife) ?Type of Home: House ?Home Access: Stairs to enter ?Entrance Stairs-Number of Steps: 1 ?Entrance Stairs-Rails: None ?Home Layout: Two level;Able to live on main level with bedroom/bathroom ?  ?  ?  ?  ?Bathroom Toilet: Standard ?  ?   ?Home Equipment: Cane - single point;Grab bars - tub/shower;BSC/3in1;Rollator (4 wheels) ?  ?Additional Comments: Was sleeping in recliner PTA ?  ? ?  ?Prior Functioning/Environment Prior Level of Function : Needs assist ?  ?  ?  ?  ?  ?  ?Mobility Comments: has needed assist since fall.  Family took him home and he stayed in recliner as he was in horrible pain. ?ADLs Comments: Wife and daughter assisting with ADLs since fall ?  ? ?  ?  ?OT Problem List: Decreased strength;Decreased range of motion;Decreased activity tolerance;Impaired balance (sitting and/or standing) ?  ?   ?OT Treatment/Interventions: Self-care/ADL training;Therapeutic exercise;Energy conservation;DME and/or AE instruction  ?  ?OT Goals(Current goals can be found in the care plan section) Acute Rehab OT Goals ?Patient Stated Goal: Return home ?OT Goal Formulation: With patient ?Time For Goal Achievement: 10/22/21 ?Potential to Achieve Goals: Good  ?OT Frequency: Min 2X/week ?  ? ?Co-evaluation   ?  ?  ?  ?  ? ?  ?AM-PAC OT "6 Clicks" Daily Activity     ?Outcome Measure Help from another person eating meals?: None ?Help from another person taking care of personal grooming?: A Little ?Help from another person toileting, which includes using toliet, bedpan, or urinal?: A Lot ?Help from another person bathing (including washing, rinsing, drying)?: A Lot ?Help  from another person to put on and taking off regular upper body clothing?: A Little ?Help from another person to put on and taking off regular lower body clothing?: A Lot ?6 Click Score: 16 ?  ?End of Session Nurse Communication: Mobility status ? ?Activity Tolerance: Patient tolerated treatment well ?Patient left: in bed;with call bell/phone within reach ? ?OT Visit Diagnosis: Unsteadiness on feet (R26.81);Other abnormalities of gait and mobility (R26.89);Muscle weakness (generalized) (M62.81)  ?              ?Time: 2774-1287 ?OT Time Calculation (min): 28 min ?Charges:  OT General  Charges ?$OT Visit: 1 Visit ?OT Evaluation ?$OT Eval Moderate Complexity: 1 Mod ?OT Treatments ?$Self Care/Home Management : 8-22 mins ? ?Jaideep Pollack MSOT, OTR/L ?Acute Rehab ?Pager: [phone] ?Office: [phone]-

## 2021-10-08 NOTE — Care Management (Signed)
?  ?  Durable Medical Equipment  ?(From admission, onward)  ?  ? ? ?  ? ?  Start     Ordered  ? 10/08/21 1028  For home use only DME Hospital bed  Once       ?Question Answer Comment  ?Length of Need Lifetime   ?Bed type Semi-electric   ?  ? 10/08/21 1029  ? 10/08/21 1028  For home use only DME standard manual wheelchair with seat cushion  Once       ?Comments: Patient suffers from balance issues which impairs their ability to perform daily activities like grooming in the home.  A walker will not resolve issue with performing activities of daily living. A wheelchair will allow patient to safely perform daily activities. Patient can safely propel the wheelchair in the home or has a caregiver who can provide assistance. Length of need Lifetime. ?Accessories: elevating leg rests (ELRs), wheel locks, extensions and anti-tippers.  ? 10/08/21 1029  ? ?  ?  ? ?  ?  ?

## 2021-10-08 NOTE — Progress Notes (Signed)
? ?  Durable Medical Equipment ?(From admission, onward) ? ? ? ? ? ? ? Start     Ordered ? 10/08/21 1028  ?For home use only DME standard manual wheelchair with seat cushion  Once      ?Comments: Patient suffers from balance issues which impairs their ability to perform daily activities like grooming in the home.  A walker will not resolve issue with performing activities of daily living. A wheelchair will allow patient to safely perform daily activities. Patient can safely propel the wheelchair in the home or has a caregiver who can provide assistance. Length of need Lifetime. ?Accessories: elevating leg rests (ELRs), wheel locks, extensions and anti-tippers. ? 10/08/21 1029 ? Unscheduled  ?For home use only DME Hospital bed  Once      ?Question Answer Comment ?Length of Need Lifetime  ?Patient has (list medical condition): morbid obesity, OSA, hypertension, metastatic renal cell cancer to the left femur status post recent IM nail  ?The above medical condition requires: Patient requires the ability to reposition frequently  ?Head must be elevated greater than: 30 degrees  ?Bed type Semi-electric  ?Support Surface: Gel Overlay  ? ? 10/08/21 1120 ? ? ? ? ? ? ? ?

## 2021-10-08 NOTE — Progress Notes (Signed)
? ?  Durable Medical Equipment ?(From admission, onward) ? ? ? ? ? ? ? Start     Ordered ? 10/08/21 1028  ?For home use only DME Hospital bed  Once      ?Question Answer Comment ?Length of Need Lifetime  ?Bed type Semi-electric  ? ? 10/08/21 1029 ? 10/08/21 1028  ?For home use only DME standard manual wheelchair with seat cushion  Once      ?Comments: Patient suffers from balance issues which impairs their ability to perform daily activities like grooming in the home.  A walker will not resolve issue with performing activities of daily living. A wheelchair will allow patient to safely perform daily activities. Patient can safely propel the wheelchair in the home or has a caregiver who can provide assistance. Length of need Lifetime. ?Accessories: elevating leg rests (ELRs), wheel locks, extensions and anti-tippers. ? 10/08/21 1029 ? ? ? ? ? ? ? ?

## 2021-10-09 ENCOUNTER — Other Ambulatory Visit (HOSPITAL_BASED_OUTPATIENT_CLINIC_OR_DEPARTMENT_OTHER): Payer: Self-pay

## 2021-10-09 NOTE — Telephone Encounter (Signed)
I just spoke to patient.  We operated last Thursday, he fell Saturday and sustained a subtroch fxr to that hip.  He is now home nwb.  Scheduled to see [place] Thursday.  Do you need to see him sooner?

## 2021-10-09 NOTE — Telephone Encounter (Signed)
No need to see sooner.  The 30th is fine.  Thanks.

## 2021-10-12 ENCOUNTER — Inpatient Hospital Stay (HOSPITAL_BASED_OUTPATIENT_CLINIC_OR_DEPARTMENT_OTHER): Payer: Medicare Other | Admitting: Oncology

## 2021-10-12 ENCOUNTER — Telehealth: Payer: Self-pay | Admitting: Orthopaedic Surgery

## 2021-10-12 ENCOUNTER — Other Ambulatory Visit: Payer: Self-pay

## 2021-10-12 DIAGNOSIS — C649 Malignant neoplasm of unspecified kidney, except renal pelvis: Secondary | ICD-10-CM | POA: Diagnosis not present

## 2021-10-12 NOTE — Progress Notes (Signed)
Hematology and Oncology Follow Up for Telemedicine Visits ? ?Thomas Michael ?[id number] ?09/06/1940 81 y.o. ?10/12/2021 11:26 AM ?Thomas Michael, MDWolters, Thomas Booty, MD  ? ?I connected with Thomas Michael on 10/12/21 at 11:30 AM EDT by telephone visit and verified that I am speaking with the correct person using two identifiers. ?  ?I discussed the limitations, risks, security and privacy concerns of performing an evaluation and management service by telemedicine and the availability of in-person appointments. I also discussed with the patient that there may be a patient responsible charge related to this service. The patient expressed understanding and agreed to proceed. ? ?Other persons participating in the visit and their role in the encounter:   ? ?Patient's location: Home ?Provider's location: Office ? ? ? 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   ?  ?  ?Prior Therapy: ?  ?He is status post right radical nephrectomy in May 2012.  He was found to have stage Ib tumor. ? ?He is status post open treatment of impending pathological fracture of the left proximal femur with intramedullary implant completed on October 01, 2021. ?  ?Current therapy: Pembrolizumab 200 mg every 3 weeks started on September 08, 2021. The addition to Inlyta 5 mg daily has been on hold for the time being. ? ? ?Interim History: [name] underwent surgical fixation of his left proximal femur and subsequently developed a fall and required repeat hospitalization between March 21 and October 08, 2021 for opiate withdrawal and was discharged on hydrocodone with Tylenol at that time.  Since that time, he reports his pain is under reasonable control and takes hydrocodone every 6 hours without any recent exacerbations.  He is still limited by his ambulation and awaiting on physical  therapy. ? ? ? ? ?Medications: I have reviewed the patient's current medications.  ?Current Outpatient Medications  ?Medication Sig Dispense Refill  ? Accu-Chek FastClix Lancets MISC check CBG    ? acetaminophen (TYLENOL) 500 MG tablet Take 1,000 mg by mouth every 8 (eight) hours as needed for moderate pain.    ? axitinib (INLYTA) 5 MG tablet Take 1 tablet (5 mg total) by mouth daily. 30 tablet 1  ? Blood Glucose Monitoring Suppl (ACCU-CHEK AVIVA PLUS) w/Device KIT check CBG    ? Cholecalciferol 50 MCG (2000 UT) TABS Take 2,000 Units by mouth daily at 12 noon.    ? ciclopirox (PENLAC) 8 % solution Apply 1 application topically daily as needed (fungus (toes)). Apply over nail and surrounding skin. Apply daily as needed over previous coat.    ? clopidogrel (PLAVIX) 75 MG tablet Take 75 mg by mouth at bedtime.    ? diphenhydrAMINE (BENADRYL) 25 MG tablet Take 50 mg by mouth at bedtime.    ? docusate sodium (COLACE) 100 MG capsule Take 200 mg by mouth daily as needed for mild constipation.    ? guaiFENesin (MUCINEX) 600 MG 12 hr tablet Take 1,200 mg by mouth 2 (two) times daily as needed for to loosen phlegm or cough.    ? HYDROcodone-acetaminophen (NORCO) 10-325 MG tablet Take 1 tablet by mouth every 6 (six) hours as needed. 20 tablet 0  ? levothyroxine (SYNTHROID, LEVOTHROID) 200 MCG tablet Take 200 mcg by mouth every evening.    ? metoprolol (TOPROL-XL) 50 MG 24 hr tablet Take 50 mg by mouth in the morning.    ?  Multiple Vitamin (MULTIVITAMIN WITH MINERALS) TABS tablet Take 1 tablet by mouth daily at 12 noon.    ? niacin 500 MG CR capsule Take 500 mg by mouth daily at 12 noon.    ? Omega-3 Fatty Acids (FISH OIL PO) Take 1,000 mg by mouth daily at 12 noon.    ? polyethylene glycol (MIRALAX MIX-IN PAX) 17 g packet Take 17 g by mouth daily. 14 each 0  ? prochlorperazine (COMPAZINE) 10 MG tablet Take 1 tablet (10 mg total) by mouth every 6 (six) hours as needed for nausea or vomiting. 30 tablet 0  ? rOPINIRole (REQUIP)  0.25 MG tablet Take 0.25 mg by mouth 2 (two) times daily.    ? rosuvastatin (CRESTOR) 20 MG tablet Take 20 mg by mouth in the morning.    ? senna (SENOKOT) 8.6 MG TABS tablet Take 1 tablet (8.6 mg total) by mouth 2 (two) times daily. 60 tablet 0  ? trazodone (DESYREL) 300 MG tablet Take 300 mg by mouth at bedtime.    ? valsartan-hydrochlorothiazide (DIOVAN-HCT) 160-12.5 MG per tablet Take 1 tablet by mouth in the morning.    ? VICTOZA 18 MG/3ML SOPN Inject 1.2 mg into the skin every evening.    ? vitamin E 400 UNIT capsule Take 400 Units by mouth daily at 12 noon.    ? ?No current facility-administered medications for this visit.  ? ? ? ?Allergies:  ?Allergies  ?Allergen Reactions  ? Promethazine-Codeine Other (See Comments)  ?  Pt does not remember reaction  ? ? ? ? ? ? ?Lab Results: ?Lab Results  ?Component Value Date  ? WBC 11.6 (H) 10/06/2021  ? HGB 9.6 (L) 10/06/2021  ? HCT 29.9 (L) 10/06/2021  ? MCV 89.5 10/06/2021  ? PLT 307 10/06/2021  ? ?  Chemistry   ?   ?Component Value Date/Time  ? NA 134 (L) 10/06/2021 2154  ? K 4.0 10/06/2021 2154  ? CL 93 (L) 10/06/2021 2154  ? CO2 27 10/06/2021 2154  ? BUN 18 10/06/2021 2154  ? CREATININE 1.21 10/06/2021 2154  ? CREATININE 1.49 (H) 09/29/2021 0858  ?    ?Component Value Date/Time  ? CALCIUM 9.1 10/06/2021 2154  ? ALKPHOS 67 10/06/2021 2154  ? AST 39 10/06/2021 2154  ? AST 11 (L) 09/29/2021 0858  ? ALT 43 10/06/2021 2154  ? ALT 11 09/29/2021 0858  ? BILITOT 1.0 10/06/2021 2154  ? BILITOT 0.4 09/29/2021 0858  ?  ? ? ? ? ? ?Impression and Plan: ? ?81 year old with: ? ?1.  Kidney cancer diagnosed in 2012.  He developed stage IV intermediate risk clear-cell renal cell carcinoma documented with bone, pulmonary and lymphadenopathy in January 2023.   ?  ?He received the first cycle of Pembro and scheduled for another treatment in the near future.  Addition of axitinib will be considered with the next cycle of treatment. ?  ?2.  Left hip and femur bone involvement: He is  status post surgical fixation without any complications at this time. ?  ?3.  Bone pain: Manageable with hydrocodone. ?  ?4.  Immune mediated complications: I continue to educate him about potential complications.  These include pneumonitis, colitis and thyroid disease. ?  ?5.  Follow-up: He will return April 4 for the start of second cycle of therapy. ?  ? ? ?I provided 20 minutes of non face-to-face telephone visit time during this encounter.  The time was dedicated to reviewing disease status update, managing complications elated to cancer  and cancer therapy. ? ?Zola Button, MD 10/12/2021 11:26 AM ? ?

## 2021-10-12 NOTE — Telephone Encounter (Signed)
Do you mind letting patient know that I spoke to xu and there is no need to see him sooner than the 30th

## 2021-10-12 NOTE — Telephone Encounter (Signed)
Pt claims he might not be able to make his appt due to still laying in the bed with no help. Call pt at [phone]. ?

## 2021-10-13 ENCOUNTER — Other Ambulatory Visit: Payer: Self-pay | Admitting: Oncology

## 2021-10-13 ENCOUNTER — Telehealth: Payer: Self-pay | Admitting: *Deleted

## 2021-10-13 MED ORDER — HYDROCODONE-ACETAMINOPHEN 10-325 MG PO TABS: 1.0000 | ORAL_TABLET | Freq: Four times a day (QID) | ORAL | 0 refills | Status: DC | PRN

## 2021-10-13 NOTE — Telephone Encounter (Signed)
Thomas Michael states he will need a refill of hydrocodone in ~ 1 day.  ?

## 2021-10-13 NOTE — Telephone Encounter (Signed)
Refill sent to [facility] by Dr Alen Blew ?

## 2021-10-14 ENCOUNTER — Telehealth: Payer: Self-pay

## 2021-10-14 ENCOUNTER — Telehealth: Payer: Self-pay | Admitting: Orthopaedic Surgery

## 2021-10-14 DIAGNOSIS — C7971 Secondary malignant neoplasm of right adrenal gland: Secondary | ICD-10-CM | POA: Diagnosis not present

## 2021-10-14 DIAGNOSIS — F1113 Opioid abuse with withdrawal: Secondary | ICD-10-CM | POA: Diagnosis not present

## 2021-10-14 DIAGNOSIS — I2510 Atherosclerotic heart disease of native coronary artery without angina pectoris: Secondary | ICD-10-CM | POA: Diagnosis not present

## 2021-10-14 DIAGNOSIS — G518 Other disorders of facial nerve: Secondary | ICD-10-CM | POA: Diagnosis not present

## 2021-10-14 DIAGNOSIS — C786 Secondary malignant neoplasm of retroperitoneum and peritoneum: Secondary | ICD-10-CM | POA: Diagnosis not present

## 2021-10-14 DIAGNOSIS — M4317 Spondylolisthesis, lumbosacral region: Secondary | ICD-10-CM | POA: Diagnosis not present

## 2021-10-14 DIAGNOSIS — Z85528 Personal history of other malignant neoplasm of kidney: Secondary | ICD-10-CM | POA: Diagnosis not present

## 2021-10-14 DIAGNOSIS — E039 Hypothyroidism, unspecified: Secondary | ICD-10-CM | POA: Diagnosis not present

## 2021-10-14 DIAGNOSIS — Z7982 Long term (current) use of aspirin: Secondary | ICD-10-CM | POA: Diagnosis not present

## 2021-10-14 DIAGNOSIS — M84552D Pathological fracture in neoplastic disease, left femur, subsequent encounter for fracture with routine healing: Secondary | ICD-10-CM | POA: Diagnosis not present

## 2021-10-14 DIAGNOSIS — Z7902 Long term (current) use of antithrombotics/antiplatelets: Secondary | ICD-10-CM | POA: Diagnosis not present

## 2021-10-14 DIAGNOSIS — Z6838 Body mass index (BMI) 38.0-38.9, adult: Secondary | ICD-10-CM | POA: Diagnosis not present

## 2021-10-14 DIAGNOSIS — G4733 Obstructive sleep apnea (adult) (pediatric): Secondary | ICD-10-CM | POA: Diagnosis not present

## 2021-10-14 DIAGNOSIS — M1712 Unilateral primary osteoarthritis, left knee: Secondary | ICD-10-CM | POA: Diagnosis not present

## 2021-10-14 DIAGNOSIS — G894 Chronic pain syndrome: Secondary | ICD-10-CM | POA: Diagnosis not present

## 2021-10-14 DIAGNOSIS — Z905 Acquired absence of kidney: Secondary | ICD-10-CM | POA: Diagnosis not present

## 2021-10-14 DIAGNOSIS — I119 Hypertensive heart disease without heart failure: Secondary | ICD-10-CM | POA: Diagnosis not present

## 2021-10-14 DIAGNOSIS — C7972 Secondary malignant neoplasm of left adrenal gland: Secondary | ICD-10-CM | POA: Diagnosis not present

## 2021-10-14 DIAGNOSIS — E785 Hyperlipidemia, unspecified: Secondary | ICD-10-CM | POA: Diagnosis not present

## 2021-10-14 DIAGNOSIS — C7951 Secondary malignant neoplasm of bone: Secondary | ICD-10-CM | POA: Diagnosis not present

## 2021-10-14 DIAGNOSIS — I700 Atherosclerosis of aorta: Secondary | ICD-10-CM | POA: Diagnosis not present

## 2021-10-14 DIAGNOSIS — Z955 Presence of coronary angioplasty implant and graft: Secondary | ICD-10-CM | POA: Diagnosis not present

## 2021-10-14 DIAGNOSIS — C649 Malignant neoplasm of unspecified kidney, except renal pelvis: Secondary | ICD-10-CM | POA: Diagnosis not present

## 2021-10-14 DIAGNOSIS — Z87891 Personal history of nicotine dependence: Secondary | ICD-10-CM | POA: Diagnosis not present

## 2021-10-14 DIAGNOSIS — E119 Type 2 diabetes mellitus without complications: Secondary | ICD-10-CM | POA: Diagnosis not present

## 2021-10-14 DIAGNOSIS — Z7985 Long-term (current) use of injectable non-insulin antidiabetic drugs: Secondary | ICD-10-CM | POA: Diagnosis not present

## 2021-10-14 DIAGNOSIS — K5710 Diverticulosis of small intestine without perforation or abscess without bleeding: Secondary | ICD-10-CM | POA: Diagnosis not present

## 2021-10-14 NOTE — Telephone Encounter (Signed)
Please advise on all. ?

## 2021-10-14 NOTE — Telephone Encounter (Signed)
Received call from [place] with [facility] home health stating he did eval toda0y and need verbal orders for HHPT 1 Wk 1, 1 Wk 2 and 1 Wk 5. Hilliard Clark said patient is suppose to be non weight bearing but is having a lot of trouble when he transfer to bedside commode and wheelchair. Sean asked for clarification on weight bearing status. The number to contact Hilliard Clark is [phone] ?

## 2021-10-14 NOTE — Telephone Encounter (Signed)
Message already sent to Dr. Erlinda Hong. Pending response. ? ?

## 2021-10-14 NOTE — Telephone Encounter (Signed)
Patient would like a call back from Dr. Erlinda Hong.  Patient did not state what was needed.   ?CB# [phone].  Please advise.  Thank you ? ?

## 2021-10-14 NOTE — Telephone Encounter (Signed)
Thomas Michael with [facility] called and states that pt is unable to attend appt tomorrow. Is it okay if they take staples out for pt at the end of this week?  ? ?CB [phone]  ?

## 2021-10-15 ENCOUNTER — Encounter: Payer: Medicare Other | Admitting: Orthopaedic Surgery

## 2021-10-15 NOTE — Telephone Encounter (Signed)
yes

## 2021-10-15 NOTE — Telephone Encounter (Signed)
Called to advise.  

## 2021-10-15 NOTE — Telephone Encounter (Signed)
Approved orders ?

## 2021-10-15 NOTE — Telephone Encounter (Signed)
Approve on HHPT.  He can be WBAT but can be PWB if too much pain.

## 2021-10-16 ENCOUNTER — Other Ambulatory Visit (HOSPITAL_COMMUNITY): Payer: Self-pay

## 2021-10-16 DIAGNOSIS — C7972 Secondary malignant neoplasm of left adrenal gland: Secondary | ICD-10-CM | POA: Diagnosis not present

## 2021-10-16 DIAGNOSIS — C7971 Secondary malignant neoplasm of right adrenal gland: Secondary | ICD-10-CM | POA: Diagnosis not present

## 2021-10-16 DIAGNOSIS — I119 Hypertensive heart disease without heart failure: Secondary | ICD-10-CM | POA: Diagnosis not present

## 2021-10-16 DIAGNOSIS — M84552D Pathological fracture in neoplastic disease, left femur, subsequent encounter for fracture with routine healing: Secondary | ICD-10-CM | POA: Diagnosis not present

## 2021-10-16 DIAGNOSIS — C786 Secondary malignant neoplasm of retroperitoneum and peritoneum: Secondary | ICD-10-CM | POA: Diagnosis not present

## 2021-10-16 DIAGNOSIS — C7951 Secondary malignant neoplasm of bone: Secondary | ICD-10-CM | POA: Diagnosis not present

## 2021-10-19 DIAGNOSIS — C7971 Secondary malignant neoplasm of right adrenal gland: Secondary | ICD-10-CM | POA: Diagnosis not present

## 2021-10-19 DIAGNOSIS — I119 Hypertensive heart disease without heart failure: Secondary | ICD-10-CM | POA: Diagnosis not present

## 2021-10-19 DIAGNOSIS — C7972 Secondary malignant neoplasm of left adrenal gland: Secondary | ICD-10-CM | POA: Diagnosis not present

## 2021-10-19 DIAGNOSIS — C7951 Secondary malignant neoplasm of bone: Secondary | ICD-10-CM | POA: Diagnosis not present

## 2021-10-19 DIAGNOSIS — M84552D Pathological fracture in neoplastic disease, left femur, subsequent encounter for fracture with routine healing: Secondary | ICD-10-CM | POA: Diagnosis not present

## 2021-10-19 DIAGNOSIS — C786 Secondary malignant neoplasm of retroperitoneum and peritoneum: Secondary | ICD-10-CM | POA: Diagnosis not present

## 2021-10-20 ENCOUNTER — Inpatient Hospital Stay: Payer: Medicare Other | Admitting: Oncology

## 2021-10-20 ENCOUNTER — Other Ambulatory Visit: Payer: Medicare Other

## 2021-10-20 ENCOUNTER — Inpatient Hospital Stay: Payer: Medicare Other

## 2021-10-20 DIAGNOSIS — C7971 Secondary malignant neoplasm of right adrenal gland: Secondary | ICD-10-CM | POA: Diagnosis not present

## 2021-10-20 DIAGNOSIS — C7951 Secondary malignant neoplasm of bone: Secondary | ICD-10-CM | POA: Diagnosis not present

## 2021-10-20 DIAGNOSIS — C7972 Secondary malignant neoplasm of left adrenal gland: Secondary | ICD-10-CM | POA: Diagnosis not present

## 2021-10-20 DIAGNOSIS — I119 Hypertensive heart disease without heart failure: Secondary | ICD-10-CM | POA: Diagnosis not present

## 2021-10-20 DIAGNOSIS — M84552D Pathological fracture in neoplastic disease, left femur, subsequent encounter for fracture with routine healing: Secondary | ICD-10-CM | POA: Diagnosis not present

## 2021-10-20 DIAGNOSIS — C786 Secondary malignant neoplasm of retroperitoneum and peritoneum: Secondary | ICD-10-CM | POA: Diagnosis not present

## 2021-10-21 DIAGNOSIS — M84552D Pathological fracture in neoplastic disease, left femur, subsequent encounter for fracture with routine healing: Secondary | ICD-10-CM | POA: Diagnosis not present

## 2021-10-21 DIAGNOSIS — I119 Hypertensive heart disease without heart failure: Secondary | ICD-10-CM | POA: Diagnosis not present

## 2021-10-21 DIAGNOSIS — C786 Secondary malignant neoplasm of retroperitoneum and peritoneum: Secondary | ICD-10-CM | POA: Diagnosis not present

## 2021-10-21 DIAGNOSIS — C7951 Secondary malignant neoplasm of bone: Secondary | ICD-10-CM | POA: Diagnosis not present

## 2021-10-21 DIAGNOSIS — C7971 Secondary malignant neoplasm of right adrenal gland: Secondary | ICD-10-CM | POA: Diagnosis not present

## 2021-10-21 DIAGNOSIS — C7972 Secondary malignant neoplasm of left adrenal gland: Secondary | ICD-10-CM | POA: Diagnosis not present

## 2021-10-22 ENCOUNTER — Other Ambulatory Visit (HOSPITAL_BASED_OUTPATIENT_CLINIC_OR_DEPARTMENT_OTHER): Payer: Self-pay

## 2021-10-22 DIAGNOSIS — C7951 Secondary malignant neoplasm of bone: Secondary | ICD-10-CM | POA: Diagnosis not present

## 2021-10-22 DIAGNOSIS — C7971 Secondary malignant neoplasm of right adrenal gland: Secondary | ICD-10-CM | POA: Diagnosis not present

## 2021-10-22 DIAGNOSIS — C786 Secondary malignant neoplasm of retroperitoneum and peritoneum: Secondary | ICD-10-CM | POA: Diagnosis not present

## 2021-10-22 DIAGNOSIS — C7972 Secondary malignant neoplasm of left adrenal gland: Secondary | ICD-10-CM | POA: Diagnosis not present

## 2021-10-22 DIAGNOSIS — I119 Hypertensive heart disease without heart failure: Secondary | ICD-10-CM | POA: Diagnosis not present

## 2021-10-22 DIAGNOSIS — M84552D Pathological fracture in neoplastic disease, left femur, subsequent encounter for fracture with routine healing: Secondary | ICD-10-CM | POA: Diagnosis not present

## 2021-10-23 DIAGNOSIS — C7971 Secondary malignant neoplasm of right adrenal gland: Secondary | ICD-10-CM | POA: Diagnosis not present

## 2021-10-23 DIAGNOSIS — C786 Secondary malignant neoplasm of retroperitoneum and peritoneum: Secondary | ICD-10-CM | POA: Diagnosis not present

## 2021-10-23 DIAGNOSIS — I119 Hypertensive heart disease without heart failure: Secondary | ICD-10-CM | POA: Diagnosis not present

## 2021-10-23 DIAGNOSIS — M84552D Pathological fracture in neoplastic disease, left femur, subsequent encounter for fracture with routine healing: Secondary | ICD-10-CM | POA: Diagnosis not present

## 2021-10-23 DIAGNOSIS — C7972 Secondary malignant neoplasm of left adrenal gland: Secondary | ICD-10-CM | POA: Diagnosis not present

## 2021-10-23 DIAGNOSIS — C7951 Secondary malignant neoplasm of bone: Secondary | ICD-10-CM | POA: Diagnosis not present

## 2021-10-26 DIAGNOSIS — M84552D Pathological fracture in neoplastic disease, left femur, subsequent encounter for fracture with routine healing: Secondary | ICD-10-CM | POA: Diagnosis not present

## 2021-10-26 DIAGNOSIS — I119 Hypertensive heart disease without heart failure: Secondary | ICD-10-CM | POA: Diagnosis not present

## 2021-10-26 DIAGNOSIS — C7951 Secondary malignant neoplasm of bone: Secondary | ICD-10-CM | POA: Diagnosis not present

## 2021-10-26 DIAGNOSIS — C7972 Secondary malignant neoplasm of left adrenal gland: Secondary | ICD-10-CM | POA: Diagnosis not present

## 2021-10-26 DIAGNOSIS — C786 Secondary malignant neoplasm of retroperitoneum and peritoneum: Secondary | ICD-10-CM | POA: Diagnosis not present

## 2021-10-26 DIAGNOSIS — C7971 Secondary malignant neoplasm of right adrenal gland: Secondary | ICD-10-CM | POA: Diagnosis not present

## 2021-10-27 ENCOUNTER — Inpatient Hospital Stay: Payer: Medicare Other

## 2021-10-27 ENCOUNTER — Other Ambulatory Visit: Payer: Self-pay

## 2021-10-27 ENCOUNTER — Inpatient Hospital Stay (HOSPITAL_BASED_OUTPATIENT_CLINIC_OR_DEPARTMENT_OTHER): Payer: Medicare Other | Admitting: Oncology

## 2021-10-27 ENCOUNTER — Inpatient Hospital Stay: Payer: Medicare Other | Attending: Physician Assistant

## 2021-10-27 VITALS — BP 120/69 | HR 66 | Temp 97.8°F | Resp 18 | Ht 66.0 in | Wt 231.7 lb

## 2021-10-27 DIAGNOSIS — Z5112 Encounter for antineoplastic immunotherapy: Secondary | ICD-10-CM | POA: Diagnosis not present

## 2021-10-27 DIAGNOSIS — C649 Malignant neoplasm of unspecified kidney, except renal pelvis: Secondary | ICD-10-CM | POA: Diagnosis not present

## 2021-10-27 DIAGNOSIS — I10 Essential (primary) hypertension: Secondary | ICD-10-CM | POA: Diagnosis not present

## 2021-10-27 DIAGNOSIS — Z79899 Other long term (current) drug therapy: Secondary | ICD-10-CM | POA: Insufficient documentation

## 2021-10-27 DIAGNOSIS — C7951 Secondary malignant neoplasm of bone: Secondary | ICD-10-CM | POA: Insufficient documentation

## 2021-10-27 DIAGNOSIS — E032 Hypothyroidism due to medicaments and other exogenous substances: Secondary | ICD-10-CM

## 2021-10-27 LAB — CMP (CANCER CENTER ONLY)
ALT: 11 U/L (ref 0–44)
AST: 14 U/L — ABNORMAL LOW (ref 15–41)
Albumin: 3.3 g/dL — ABNORMAL LOW (ref 3.5–5.0)
Alkaline Phosphatase: 103 U/L (ref 38–126)
Anion gap: 9 (ref 5–15)
BUN: 9 mg/dL (ref 8–23)
CO2: 27 mmol/L (ref 22–32)
Calcium: 8.5 mg/dL — ABNORMAL LOW (ref 8.9–10.3)
Chloride: 98 mmol/L (ref 98–111)
Creatinine: 1.26 mg/dL — ABNORMAL HIGH (ref 0.61–1.24)
GFR, Estimated: 58 mL/min — ABNORMAL LOW (ref >60)
Glucose, Bld: 158 mg/dL — ABNORMAL HIGH (ref 70–99)
Potassium: 3.3 mmol/L — ABNORMAL LOW (ref 3.5–5.1)
Sodium: 134 mmol/L — ABNORMAL LOW (ref 135–145)
Total Bilirubin: 0.4 mg/dL (ref 0.3–1.2)
Total Protein: 6.6 g/dL (ref 6.5–8.1)

## 2021-10-27 LAB — CBC WITH DIFFERENTIAL (CANCER CENTER ONLY)
Abs Immature Granulocytes: 0.02 10*3/uL (ref 0.00–0.07)
Basophils Absolute: 0.1 10*3/uL (ref 0.0–0.1)
Basophils Relative: 1 %
Eosinophils Absolute: 0.3 10*3/uL (ref 0.0–0.5)
Eosinophils Relative: 4 %
HCT: 30.7 % — ABNORMAL LOW (ref 39.0–52.0)
Hemoglobin: 9.6 g/dL — ABNORMAL LOW (ref 13.0–17.0)
Immature Granulocytes: 0 %
Lymphocytes Relative: 13 %
Lymphs Abs: 1 10*3/uL (ref 0.7–4.0)
MCH: 27.3 pg (ref 26.0–34.0)
MCHC: 31.3 g/dL (ref 30.0–36.0)
MCV: 87.2 fL (ref 80.0–100.0)
Monocytes Absolute: 0.6 10*3/uL (ref 0.1–1.0)
Monocytes Relative: 8 %
Neutro Abs: 5.4 10*3/uL (ref 1.7–7.7)
Neutrophils Relative %: 74 %
Platelet Count: 286 10*3/uL (ref 150–400)
RBC: 3.52 MIL/uL — ABNORMAL LOW (ref 4.22–5.81)
RDW: 14.5 % (ref 11.5–15.5)
WBC Count: 7.4 10*3/uL (ref 4.0–10.5)
nRBC: 0 % (ref 0.0–0.2)

## 2021-10-27 LAB — TSH: TSH: 11.801 u[IU]/mL — ABNORMAL HIGH (ref 0.320–4.118)

## 2021-10-27 MED ORDER — SODIUM CHLORIDE 0.9 % IV SOLN
200.0000 mg | Freq: Once | INTRAVENOUS | Status: AC
  Administered 2021-10-27: 200 mg via INTRAVENOUS
  Filled 2021-10-27: qty 200

## 2021-10-27 MED ORDER — SODIUM CHLORIDE 0.9 % IV SOLN: Freq: Once | INTRAVENOUS | Status: AC

## 2021-10-27 NOTE — Patient Instructions (Signed)
[facility]ICAL [facility]COLOG[facility]   ?Discharge Instructions: ?Thank you for choosing [facility] to provide your oncology and hematology care.  ? ?If you have a lab appointment with the Cancer Center, please go directly to the Cancer Center and check in at the registration area. ?  ?Wear comfortable clothing and clothing appropriate for easy access to any Portacath or PICC line.  ? ?We strive to give you quality time with your provider. You may need to reschedule your appointment if you arrive late (15 or more minutes).  Arriving late affects you and other patients whose appointments are after yours.  Also, if you miss three or more appointments without notifying the office, you may be dismissed from the clinic at the provider?s discretion.    ?  ?For prescription refill requests, have your pharmacy contact our office and allow 72 hours for refills to be completed.   ? ?Today you received the following chemotherapy and/or immunotherapy agents: pembrolizumab    ?  ?To help prevent nausea and vomiting after your treatment, we encourage you to take your nausea medication as directed. ? ?BELOW ARE SYMPTOMS THAT SHOULD BE REPORTED IMMEDIATELY: ?*FEVER GREATER THAN 100.4 F (38 ?C) OR HIGHER ?*CHILLS OR SWEATING ?*NAUSEA AND VOMITING THAT IS NOT CONTROLLED WITH YOUR NAUSEA MEDICATION ?*UNUSUAL SHORTNESS OF BREATH ?*UNUSUAL BRUISING OR BLEEDING ?*URINARY PROBLEMS (pain or burning when urinating, or frequent urination) ?*BOWEL PROBLEMS (unusual diarrhea, constipation, pain near the anus) ?TENDERNESS IN MOUTH AND THROAT WITH OR WITHOUT PRESENCE OF ULCERS (sore throat, sores in mouth, or a toothache) ?UNUSUAL RASH, SWELLING OR PAIN  ?UNUSUAL VAGINAL DISCHARGE OR ITCHING  ? ?Items with * indicate a potential emergency and should be followed up as soon as possible or go to the Emergency Department if any problems should occur. ? ?Please show the CHEMOTHERAPY ALERT CARD or IMMUNOTHERAPY ALERT CARD at  check-in to the Emergency Department and triage nurse. ? ?Should you have questions after your visit or need to cancel or reschedule your appointment, please contact [facility]  Dept: [phone]  and follow the prompts.  Office hours are 8:00 a.m. to 4:30 p.m. Monday - Friday. Please note that voicemails left after 4:00 p.m. may not be returned until the following business day.  We are closed weekends and major holidays. You have access to a nurse at all times for urgent questions. Please call the main number to the clinic Dept: [phone] and follow the prompts. ? ? ?For any non-urgent questions, you may also contact your provider using MyChart. We now offer e-Visits for anyone 18 and older to request care online for non-urgent symptoms. For details visit [email or web address]. ?  ?Also download the MyChart app! Go to the app store, search "MyChart", open the app, select [facility], and log in with your MyChart username and password. ? ?Due to Covid, a mask is required upon entering the hospital/clinic. If you do not have a mask, one will be given to you upon arrival. For doctor visits, patients may have 1 support person aged 18 or older with them. For treatment visits, patients cannot have anyone with them due to current Covid guidelines and our immunocompromised population.  ? ?

## 2021-10-27 NOTE — Progress Notes (Signed)
Hematology and Oncology Follow Up  ? ?Thomas Michael ?[id number] ?03-12-1941 81 y.o. ?10/27/2021 12:06 PM ?Thomas Michael, MDWolters, [name], MD  ? ? ? ? 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   He initially presented with localized disease in 2012. ?  ?  ?Prior Therapy: ?  ?He is status post right radical nephrectomy in May 2012.  He was found to have stage Ib tumor. ? ?He is status post open treatment of impending pathological fracture of the left proximal femur with intramedullary implant completed on October 01, 2021. ?  ?Current therapy: Pembrolizumab 200 mg every 3 weeks started on September 08, 2021.  Under consideration to start Inlyta 5 mg daily in the near future. ? ? ?Interim History: [name] is here for a follow-up visit.  Since the last visit, he reports slow but progressive recovery from his hip surgery and he is ambulating with the help of physical therapy.  He denies any recent falls or syncope.  His pain is manageable at this time with hydrocodone.  He did report some changing in his taste in mouth dryness but no pain.  He denies any complications related to Pembrolizumab. ? ? ? ? ?Medications: Updated on review. ?Current Outpatient Medications  ?Medication Sig Dispense Refill  ? Accu-Chek FastClix Lancets MISC check CBG    ? acetaminophen (TYLENOL) 500 MG tablet Take 1,000 mg by mouth every 8 (eight) hours as needed for moderate pain.    ? axitinib (INLYTA) 5 MG tablet Take 1 tablet (5 mg total) by mouth daily. 30 tablet 1  ? Blood Glucose Monitoring Suppl (ACCU-CHEK AVIVA PLUS) w/Device KIT check CBG    ? Cholecalciferol 50 MCG (2000 UT) TABS Take 2,000 Units by mouth daily at 12 noon.    ? ciclopirox (PENLAC) 8 % solution Apply 1 application topically daily as needed (fungus (toes)). Apply over nail and surrounding skin. Apply daily as needed over previous coat.    ? clopidogrel (PLAVIX) 75  MG tablet Take 75 mg by mouth at bedtime.    ? diphenhydrAMINE (BENADRYL) 25 MG tablet Take 50 mg by mouth at bedtime.    ? docusate sodium (COLACE) 100 MG capsule Take 200 mg by mouth daily as needed for mild constipation.    ? guaiFENesin (MUCINEX) 600 MG 12 hr tablet Take 1,200 mg by mouth 2 (two) times daily as needed for to loosen phlegm or cough.    ? HYDROcodone-acetaminophen (NORCO) 10-325 MG tablet Take 1 tablet by mouth every 6 (six) hours as needed. 60 tablet 0  ? levothyroxine (SYNTHROID, LEVOTHROID) 200 MCG tablet Take 200 mcg by mouth every evening.    ? metoprolol (TOPROL-XL) 50 MG 24 hr tablet Take 50 mg by mouth in the morning.    ? Multiple Vitamin (MULTIVITAMIN WITH MINERALS) TABS tablet Take 1 tablet by mouth daily at 12 noon.    ? niacin 500 MG CR capsule Take 500 mg by mouth daily at 12 noon.    ? Omega-3 Fatty Acids (FISH OIL PO) Take 1,000 mg by mouth daily at 12 noon.    ? polyethylene glycol (MIRALAX MIX-IN PAX) 17 g packet Take 17 g by mouth daily. 14 each 0  ? prochlorperazine (COMPAZINE) 10 MG tablet Take 1 tablet (10 mg total) by mouth every 6 (six) hours as needed for nausea or vomiting. 30 tablet 0  ? rOPINIRole (REQUIP) 0.25 MG tablet Take 0.25 mg  by mouth 2 (two) times daily.    ? rosuvastatin (CRESTOR) 20 MG tablet Take 20 mg by mouth in the morning.    ? senna (SENOKOT) 8.6 MG TABS tablet Take 1 tablet (8.6 mg total) by mouth 2 (two) times daily. 60 tablet 0  ? trazodone (DESYREL) 300 MG tablet Take 300 mg by mouth at bedtime.    ? valsartan-hydrochlorothiazide (DIOVAN-HCT) 160-12.5 MG per tablet Take 1 tablet by mouth in the morning.    ? VICTOZA 18 MG/3ML SOPN Inject 1.2 mg into the skin every evening.    ? vitamin E 400 UNIT capsule Take 400 Units by mouth daily at 12 noon.    ? ?No current facility-administered medications for this visit.  ? ? ? ?Allergies:  ?Allergies  ?Allergen Reactions  ? Promethazine-Codeine Other (See Comments)  ?  Pt does not remember reaction   ? ?Physical exam ? ? ?Blood pressure 120/69, pulse 66, temperature 97.8 ?F (36.6 ?C), temperature source Temporal, resp. rate 18, height 5' 6"  (1.676 m), weight 231 lb 11.2 oz (105.1 kg), SpO2 97 %. ? ?ECOG 1 ? ? ?General appearance: Alert, awake without any distress. ?Head: Atraumatic without abnormalities ?Oropharynx: Without any thrush or ulcers. ?Eyes: No scleral icterus. ?Lymph nodes: No lymphadenopathy noted in the cervical, supraclavicular, or axillary nodes ?Heart:regular rate and rhythm, without any murmurs or gallops.   ?Lung: Clear to auscultation without any rhonchi, wheezes or dullness to percussion. ?Abdomin: Soft, nontender without any shifting dullness or ascites. ?Musculoskeletal: No clubbing or cyanosis. ?Neurological: No motor or sensory deficits. ?Skin: No rashes or lesions. ?Psychiatric: Mood and affect appeared normal. ? ? ? ? ?Lab Results: ?Lab Results  ?Component Value Date  ? WBC 11.6 (H) 10/06/2021  ? HGB 9.6 (L) 10/06/2021  ? HCT 29.9 (L) 10/06/2021  ? MCV 89.5 10/06/2021  ? PLT 307 10/06/2021  ? ?  Chemistry   ?   ?Component Value Date/Time  ? NA 134 (L) 10/06/2021 2154  ? K 4.0 10/06/2021 2154  ? CL 93 (L) 10/06/2021 2154  ? CO2 27 10/06/2021 2154  ? BUN 18 10/06/2021 2154  ? CREATININE 1.21 10/06/2021 2154  ? CREATININE 1.49 (H) 09/29/2021 0858  ?    ?Component Value Date/Time  ? CALCIUM 9.1 10/06/2021 2154  ? ALKPHOS 67 10/06/2021 2154  ? AST 39 10/06/2021 2154  ? AST 11 (L) 09/29/2021 0858  ? ALT 43 10/06/2021 2154  ? ALT 11 09/29/2021 0858  ? BILITOT 1.0 10/06/2021 2154  ? BILITOT 0.4 09/29/2021 0858  ?  ? ? ? ? ? ?Impression and Plan: ? ?81 year old with: ? ?1.  Stage IV intermediate risk clear-cell renal cell carcinoma diagnosed in January 2023.  He was found to have  bone, pulmonary disease in addition to lymphadenopathy. ?  ?The natural course of his disease was reviewed at this time and treatment choices were reiterated.  I recommended proceeding with second cycle of Pembro  today and adding Inlyta 5 mg daily.  Complication associated with Inlyta includes diarrhea, fatigue and hypertension. ? ?After discussion today, we opted to proceed with Pembrolizumab alone and defer Inlyta to a later date.  He is interested in concentrating on physical therapy and does not want any new side effects to hinder his progress. ?  ?2.  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  He is participating in physical therapy. ?  ?3.  Bone pain: He is currently  on hydrocodone with pain is manageable. ?  ?4.  Immune mediated complications: He has not experienced any complications at this time.  These include pneumonitis, colitis and thyroid disease. ?  ?5.  Follow-up: In 3 weeks for the next cycle of therapy. ?  ?30  minutes were dedicated to this visit. The time was spent on discussing treatment choices, addressing complication related to cancer, cancer treatment and future plan of care review. ? ? ?Zola Button, MD 10/27/2021 12:06 PM ? ?

## 2021-10-28 ENCOUNTER — Encounter: Payer: Self-pay | Admitting: Oncology

## 2021-10-28 ENCOUNTER — Other Ambulatory Visit: Payer: Self-pay

## 2021-10-28 DIAGNOSIS — C7971 Secondary malignant neoplasm of right adrenal gland: Secondary | ICD-10-CM | POA: Diagnosis not present

## 2021-10-28 DIAGNOSIS — M84552D Pathological fracture in neoplastic disease, left femur, subsequent encounter for fracture with routine healing: Secondary | ICD-10-CM | POA: Diagnosis not present

## 2021-10-28 DIAGNOSIS — C7972 Secondary malignant neoplasm of left adrenal gland: Secondary | ICD-10-CM | POA: Diagnosis not present

## 2021-10-28 DIAGNOSIS — C7951 Secondary malignant neoplasm of bone: Secondary | ICD-10-CM | POA: Diagnosis not present

## 2021-10-28 DIAGNOSIS — C786 Secondary malignant neoplasm of retroperitoneum and peritoneum: Secondary | ICD-10-CM | POA: Diagnosis not present

## 2021-10-28 DIAGNOSIS — I119 Hypertensive heart disease without heart failure: Secondary | ICD-10-CM | POA: Diagnosis not present

## 2021-10-28 MED ORDER — MAGIC MOUTHWASH W/LIDOCAINE: 5.0000 mL | Freq: Four times a day (QID) | ORAL | 0 refills | Status: DC

## 2021-10-29 DIAGNOSIS — C786 Secondary malignant neoplasm of retroperitoneum and peritoneum: Secondary | ICD-10-CM | POA: Diagnosis not present

## 2021-10-29 DIAGNOSIS — C7972 Secondary malignant neoplasm of left adrenal gland: Secondary | ICD-10-CM | POA: Diagnosis not present

## 2021-10-29 DIAGNOSIS — C7951 Secondary malignant neoplasm of bone: Secondary | ICD-10-CM | POA: Diagnosis not present

## 2021-10-29 DIAGNOSIS — M84552D Pathological fracture in neoplastic disease, left femur, subsequent encounter for fracture with routine healing: Secondary | ICD-10-CM | POA: Diagnosis not present

## 2021-10-29 DIAGNOSIS — C7971 Secondary malignant neoplasm of right adrenal gland: Secondary | ICD-10-CM | POA: Diagnosis not present

## 2021-10-29 DIAGNOSIS — I119 Hypertensive heart disease without heart failure: Secondary | ICD-10-CM | POA: Diagnosis not present

## 2021-10-30 ENCOUNTER — Telehealth: Payer: Self-pay | Admitting: Oncology

## 2021-10-30 NOTE — Telephone Encounter (Signed)
Scheduled per 04/11 los, patient has been called and notified. ?

## 2021-11-02 ENCOUNTER — Other Ambulatory Visit: Payer: Self-pay | Admitting: Oncology

## 2021-11-02 DIAGNOSIS — C7951 Secondary malignant neoplasm of bone: Secondary | ICD-10-CM | POA: Diagnosis not present

## 2021-11-02 DIAGNOSIS — C786 Secondary malignant neoplasm of retroperitoneum and peritoneum: Secondary | ICD-10-CM | POA: Diagnosis not present

## 2021-11-02 DIAGNOSIS — I119 Hypertensive heart disease without heart failure: Secondary | ICD-10-CM | POA: Diagnosis not present

## 2021-11-02 DIAGNOSIS — C7972 Secondary malignant neoplasm of left adrenal gland: Secondary | ICD-10-CM | POA: Diagnosis not present

## 2021-11-02 DIAGNOSIS — M84552D Pathological fracture in neoplastic disease, left femur, subsequent encounter for fracture with routine healing: Secondary | ICD-10-CM | POA: Diagnosis not present

## 2021-11-02 DIAGNOSIS — C7971 Secondary malignant neoplasm of right adrenal gland: Secondary | ICD-10-CM | POA: Diagnosis not present

## 2021-11-03 DIAGNOSIS — C7971 Secondary malignant neoplasm of right adrenal gland: Secondary | ICD-10-CM | POA: Diagnosis not present

## 2021-11-03 DIAGNOSIS — C7951 Secondary malignant neoplasm of bone: Secondary | ICD-10-CM | POA: Diagnosis not present

## 2021-11-03 DIAGNOSIS — M84552D Pathological fracture in neoplastic disease, left femur, subsequent encounter for fracture with routine healing: Secondary | ICD-10-CM | POA: Diagnosis not present

## 2021-11-03 DIAGNOSIS — C786 Secondary malignant neoplasm of retroperitoneum and peritoneum: Secondary | ICD-10-CM | POA: Diagnosis not present

## 2021-11-03 DIAGNOSIS — C7972 Secondary malignant neoplasm of left adrenal gland: Secondary | ICD-10-CM | POA: Diagnosis not present

## 2021-11-03 DIAGNOSIS — I119 Hypertensive heart disease without heart failure: Secondary | ICD-10-CM | POA: Diagnosis not present

## 2021-11-03 MED ORDER — HYDROCODONE-ACETAMINOPHEN 10-325 MG PO TABS: 1.0000 | ORAL_TABLET | Freq: Four times a day (QID) | ORAL | 0 refills | Status: DC | PRN

## 2021-11-05 DIAGNOSIS — C786 Secondary malignant neoplasm of retroperitoneum and peritoneum: Secondary | ICD-10-CM | POA: Diagnosis not present

## 2021-11-05 DIAGNOSIS — C7971 Secondary malignant neoplasm of right adrenal gland: Secondary | ICD-10-CM | POA: Diagnosis not present

## 2021-11-05 DIAGNOSIS — I119 Hypertensive heart disease without heart failure: Secondary | ICD-10-CM | POA: Diagnosis not present

## 2021-11-05 DIAGNOSIS — C7951 Secondary malignant neoplasm of bone: Secondary | ICD-10-CM | POA: Diagnosis not present

## 2021-11-05 DIAGNOSIS — C7972 Secondary malignant neoplasm of left adrenal gland: Secondary | ICD-10-CM | POA: Diagnosis not present

## 2021-11-05 DIAGNOSIS — M84552D Pathological fracture in neoplastic disease, left femur, subsequent encounter for fracture with routine healing: Secondary | ICD-10-CM | POA: Diagnosis not present

## 2021-11-09 DIAGNOSIS — C7972 Secondary malignant neoplasm of left adrenal gland: Secondary | ICD-10-CM | POA: Diagnosis not present

## 2021-11-09 DIAGNOSIS — C786 Secondary malignant neoplasm of retroperitoneum and peritoneum: Secondary | ICD-10-CM | POA: Diagnosis not present

## 2021-11-09 DIAGNOSIS — M84552D Pathological fracture in neoplastic disease, left femur, subsequent encounter for fracture with routine healing: Secondary | ICD-10-CM | POA: Diagnosis not present

## 2021-11-09 DIAGNOSIS — C7971 Secondary malignant neoplasm of right adrenal gland: Secondary | ICD-10-CM | POA: Diagnosis not present

## 2021-11-09 DIAGNOSIS — C7951 Secondary malignant neoplasm of bone: Secondary | ICD-10-CM | POA: Diagnosis not present

## 2021-11-09 DIAGNOSIS — I119 Hypertensive heart disease without heart failure: Secondary | ICD-10-CM | POA: Diagnosis not present

## 2021-11-10 ENCOUNTER — Inpatient Hospital Stay: Payer: Medicare Other

## 2021-11-10 ENCOUNTER — Inpatient Hospital Stay: Payer: Medicare Other | Admitting: Oncology

## 2021-11-10 DIAGNOSIS — I119 Hypertensive heart disease without heart failure: Secondary | ICD-10-CM | POA: Diagnosis not present

## 2021-11-10 DIAGNOSIS — M84552D Pathological fracture in neoplastic disease, left femur, subsequent encounter for fracture with routine healing: Secondary | ICD-10-CM | POA: Diagnosis not present

## 2021-11-10 DIAGNOSIS — C7972 Secondary malignant neoplasm of left adrenal gland: Secondary | ICD-10-CM | POA: Diagnosis not present

## 2021-11-10 DIAGNOSIS — C7971 Secondary malignant neoplasm of right adrenal gland: Secondary | ICD-10-CM | POA: Diagnosis not present

## 2021-11-10 DIAGNOSIS — C7951 Secondary malignant neoplasm of bone: Secondary | ICD-10-CM | POA: Diagnosis not present

## 2021-11-10 DIAGNOSIS — C786 Secondary malignant neoplasm of retroperitoneum and peritoneum: Secondary | ICD-10-CM | POA: Diagnosis not present

## 2021-11-12 ENCOUNTER — Other Ambulatory Visit (HOSPITAL_COMMUNITY): Payer: Self-pay

## 2021-11-12 DIAGNOSIS — M84552D Pathological fracture in neoplastic disease, left femur, subsequent encounter for fracture with routine healing: Secondary | ICD-10-CM | POA: Diagnosis not present

## 2021-11-12 DIAGNOSIS — C7971 Secondary malignant neoplasm of right adrenal gland: Secondary | ICD-10-CM | POA: Diagnosis not present

## 2021-11-12 DIAGNOSIS — I119 Hypertensive heart disease without heart failure: Secondary | ICD-10-CM | POA: Diagnosis not present

## 2021-11-12 DIAGNOSIS — C786 Secondary malignant neoplasm of retroperitoneum and peritoneum: Secondary | ICD-10-CM | POA: Diagnosis not present

## 2021-11-12 DIAGNOSIS — C7972 Secondary malignant neoplasm of left adrenal gland: Secondary | ICD-10-CM | POA: Diagnosis not present

## 2021-11-12 DIAGNOSIS — C7951 Secondary malignant neoplasm of bone: Secondary | ICD-10-CM | POA: Diagnosis not present

## 2021-11-13 DIAGNOSIS — Z87891 Personal history of nicotine dependence: Secondary | ICD-10-CM | POA: Diagnosis not present

## 2021-11-13 DIAGNOSIS — E785 Hyperlipidemia, unspecified: Secondary | ICD-10-CM | POA: Diagnosis not present

## 2021-11-13 DIAGNOSIS — I119 Hypertensive heart disease without heart failure: Secondary | ICD-10-CM | POA: Diagnosis not present

## 2021-11-13 DIAGNOSIS — G518 Other disorders of facial nerve: Secondary | ICD-10-CM | POA: Diagnosis not present

## 2021-11-13 DIAGNOSIS — Z955 Presence of coronary angioplasty implant and graft: Secondary | ICD-10-CM | POA: Diagnosis not present

## 2021-11-13 DIAGNOSIS — Z7902 Long term (current) use of antithrombotics/antiplatelets: Secondary | ICD-10-CM | POA: Diagnosis not present

## 2021-11-13 DIAGNOSIS — K5710 Diverticulosis of small intestine without perforation or abscess without bleeding: Secondary | ICD-10-CM | POA: Diagnosis not present

## 2021-11-13 DIAGNOSIS — C7972 Secondary malignant neoplasm of left adrenal gland: Secondary | ICD-10-CM | POA: Diagnosis not present

## 2021-11-13 DIAGNOSIS — E119 Type 2 diabetes mellitus without complications: Secondary | ICD-10-CM | POA: Diagnosis not present

## 2021-11-13 DIAGNOSIS — C786 Secondary malignant neoplasm of retroperitoneum and peritoneum: Secondary | ICD-10-CM | POA: Diagnosis not present

## 2021-11-13 DIAGNOSIS — Z85528 Personal history of other malignant neoplasm of kidney: Secondary | ICD-10-CM | POA: Diagnosis not present

## 2021-11-13 DIAGNOSIS — I2510 Atherosclerotic heart disease of native coronary artery without angina pectoris: Secondary | ICD-10-CM | POA: Diagnosis not present

## 2021-11-13 DIAGNOSIS — Z905 Acquired absence of kidney: Secondary | ICD-10-CM | POA: Diagnosis not present

## 2021-11-13 DIAGNOSIS — M1712 Unilateral primary osteoarthritis, left knee: Secondary | ICD-10-CM | POA: Diagnosis not present

## 2021-11-13 DIAGNOSIS — E039 Hypothyroidism, unspecified: Secondary | ICD-10-CM | POA: Diagnosis not present

## 2021-11-13 DIAGNOSIS — M4317 Spondylolisthesis, lumbosacral region: Secondary | ICD-10-CM | POA: Diagnosis not present

## 2021-11-13 DIAGNOSIS — C7951 Secondary malignant neoplasm of bone: Secondary | ICD-10-CM | POA: Diagnosis not present

## 2021-11-13 DIAGNOSIS — I700 Atherosclerosis of aorta: Secondary | ICD-10-CM | POA: Diagnosis not present

## 2021-11-13 DIAGNOSIS — Z6838 Body mass index (BMI) 38.0-38.9, adult: Secondary | ICD-10-CM | POA: Diagnosis not present

## 2021-11-13 DIAGNOSIS — Z7982 Long term (current) use of aspirin: Secondary | ICD-10-CM | POA: Diagnosis not present

## 2021-11-13 DIAGNOSIS — G4733 Obstructive sleep apnea (adult) (pediatric): Secondary | ICD-10-CM | POA: Diagnosis not present

## 2021-11-13 DIAGNOSIS — C7971 Secondary malignant neoplasm of right adrenal gland: Secondary | ICD-10-CM | POA: Diagnosis not present

## 2021-11-13 DIAGNOSIS — Z7985 Long-term (current) use of injectable non-insulin antidiabetic drugs: Secondary | ICD-10-CM | POA: Diagnosis not present

## 2021-11-13 DIAGNOSIS — M84552D Pathological fracture in neoplastic disease, left femur, subsequent encounter for fracture with routine healing: Secondary | ICD-10-CM | POA: Diagnosis not present

## 2021-11-16 ENCOUNTER — Other Ambulatory Visit: Payer: Self-pay | Admitting: *Deleted

## 2021-11-16 DIAGNOSIS — C7972 Secondary malignant neoplasm of left adrenal gland: Secondary | ICD-10-CM | POA: Diagnosis not present

## 2021-11-16 DIAGNOSIS — M84552D Pathological fracture in neoplastic disease, left femur, subsequent encounter for fracture with routine healing: Secondary | ICD-10-CM | POA: Diagnosis not present

## 2021-11-16 DIAGNOSIS — I119 Hypertensive heart disease without heart failure: Secondary | ICD-10-CM | POA: Diagnosis not present

## 2021-11-16 DIAGNOSIS — C7951 Secondary malignant neoplasm of bone: Secondary | ICD-10-CM | POA: Diagnosis not present

## 2021-11-16 DIAGNOSIS — C786 Secondary malignant neoplasm of retroperitoneum and peritoneum: Secondary | ICD-10-CM | POA: Diagnosis not present

## 2021-11-16 DIAGNOSIS — C7971 Secondary malignant neoplasm of right adrenal gland: Secondary | ICD-10-CM | POA: Diagnosis not present

## 2021-11-16 MED ORDER — MAGIC MOUTHWASH W/LIDOCAINE: 5.0000 mL | Freq: Four times a day (QID) | ORAL | 0 refills | Status: DC

## 2021-11-19 ENCOUNTER — Inpatient Hospital Stay: Payer: Medicare Other | Attending: Physician Assistant

## 2021-11-19 ENCOUNTER — Inpatient Hospital Stay (HOSPITAL_BASED_OUTPATIENT_CLINIC_OR_DEPARTMENT_OTHER): Payer: Medicare Other | Admitting: Oncology

## 2021-11-19 ENCOUNTER — Other Ambulatory Visit: Payer: Self-pay

## 2021-11-19 ENCOUNTER — Inpatient Hospital Stay: Payer: Medicare Other

## 2021-11-19 VITALS — BP 93/53 | HR 66 | Temp 98.1°F | Resp 16 | Wt 224.9 lb

## 2021-11-19 DIAGNOSIS — E032 Hypothyroidism due to medicaments and other exogenous substances: Secondary | ICD-10-CM

## 2021-11-19 DIAGNOSIS — Z905 Acquired absence of kidney: Secondary | ICD-10-CM | POA: Insufficient documentation

## 2021-11-19 DIAGNOSIS — E039 Hypothyroidism, unspecified: Secondary | ICD-10-CM | POA: Diagnosis not present

## 2021-11-19 DIAGNOSIS — C7800 Secondary malignant neoplasm of unspecified lung: Secondary | ICD-10-CM | POA: Insufficient documentation

## 2021-11-19 DIAGNOSIS — Z85528 Personal history of other malignant neoplasm of kidney: Secondary | ICD-10-CM | POA: Insufficient documentation

## 2021-11-19 DIAGNOSIS — Z5112 Encounter for antineoplastic immunotherapy: Secondary | ICD-10-CM | POA: Diagnosis not present

## 2021-11-19 DIAGNOSIS — Z79899 Other long term (current) drug therapy: Secondary | ICD-10-CM | POA: Insufficient documentation

## 2021-11-19 DIAGNOSIS — Z79891 Long term (current) use of opiate analgesic: Secondary | ICD-10-CM | POA: Diagnosis not present

## 2021-11-19 DIAGNOSIS — I959 Hypotension, unspecified: Secondary | ICD-10-CM | POA: Diagnosis not present

## 2021-11-19 DIAGNOSIS — C649 Malignant neoplasm of unspecified kidney, except renal pelvis: Secondary | ICD-10-CM | POA: Diagnosis not present

## 2021-11-19 DIAGNOSIS — C7951 Secondary malignant neoplasm of bone: Secondary | ICD-10-CM | POA: Diagnosis not present

## 2021-11-19 DIAGNOSIS — D649 Anemia, unspecified: Secondary | ICD-10-CM | POA: Diagnosis not present

## 2021-11-19 DIAGNOSIS — G893 Neoplasm related pain (acute) (chronic): Secondary | ICD-10-CM | POA: Diagnosis not present

## 2021-11-19 LAB — CBC WITH DIFFERENTIAL (CANCER CENTER ONLY)
Abs Immature Granulocytes: 0.04 10*3/uL (ref 0.00–0.07)
Basophils Absolute: 0.1 10*3/uL (ref 0.0–0.1)
Basophils Relative: 1 %
Eosinophils Absolute: 0.3 10*3/uL (ref 0.0–0.5)
Eosinophils Relative: 4 %
HCT: 31.8 % — ABNORMAL LOW (ref 39.0–52.0)
Hemoglobin: 10.1 g/dL — ABNORMAL LOW (ref 13.0–17.0)
Immature Granulocytes: 1 %
Lymphocytes Relative: 14 %
Lymphs Abs: 1.1 10*3/uL (ref 0.7–4.0)
MCH: 27.3 pg (ref 26.0–34.0)
MCHC: 31.8 g/dL (ref 30.0–36.0)
MCV: 85.9 fL (ref 80.0–100.0)
Monocytes Absolute: 0.6 10*3/uL (ref 0.1–1.0)
Monocytes Relative: 8 %
Neutro Abs: 5.5 10*3/uL (ref 1.7–7.7)
Neutrophils Relative %: 72 %
Platelet Count: 325 10*3/uL (ref 150–400)
RBC: 3.7 MIL/uL — ABNORMAL LOW (ref 4.22–5.81)
RDW: 14.6 % (ref 11.5–15.5)
WBC Count: 7.5 10*3/uL (ref 4.0–10.5)
nRBC: 0 % (ref 0.0–0.2)

## 2021-11-19 LAB — CMP (CANCER CENTER ONLY)
ALT: 11 U/L (ref 0–44)
AST: 13 U/L — ABNORMAL LOW (ref 15–41)
Albumin: 3.6 g/dL (ref 3.5–5.0)
Alkaline Phosphatase: 88 U/L (ref 38–126)
Anion gap: 8 (ref 5–15)
BUN: 13 mg/dL (ref 8–23)
CO2: 27 mmol/L (ref 22–32)
Calcium: 9.1 mg/dL (ref 8.9–10.3)
Chloride: 99 mmol/L (ref 98–111)
Creatinine: 1.4 mg/dL — ABNORMAL HIGH (ref 0.61–1.24)
GFR, Estimated: 51 mL/min — ABNORMAL LOW (ref >60)
Glucose, Bld: 165 mg/dL — ABNORMAL HIGH (ref 70–99)
Potassium: 3.8 mmol/L (ref 3.5–5.1)
Sodium: 134 mmol/L — ABNORMAL LOW (ref 135–145)
Total Bilirubin: 0.4 mg/dL (ref 0.3–1.2)
Total Protein: 7.3 g/dL (ref 6.5–8.1)

## 2021-11-19 LAB — TSH: TSH: 10.697 u[IU]/mL — ABNORMAL HIGH (ref 0.350–4.500)

## 2021-11-19 MED ORDER — NYSTATIN-TRIAMCINOLONE 100000-0.1 UNIT/GM-% EX OINT: 1.0000 "application " | TOPICAL_OINTMENT | Freq: Two times a day (BID) | CUTANEOUS | 3 refills | Status: AC

## 2021-11-19 MED ORDER — SODIUM CHLORIDE 0.9 % IV SOLN
200.0000 mg | Freq: Once | INTRAVENOUS | Status: AC
  Administered 2021-11-19: 200 mg via INTRAVENOUS
  Filled 2021-11-19: qty 200

## 2021-11-19 MED ORDER — HYDROCODONE-ACETAMINOPHEN 10-325 MG PO TABS: 1.0000 | ORAL_TABLET | Freq: Four times a day (QID) | ORAL | 0 refills | Status: DC | PRN

## 2021-11-19 MED ORDER — SODIUM CHLORIDE 0.9 % IV SOLN: Freq: Once | INTRAVENOUS | Status: AC

## 2021-11-19 NOTE — Progress Notes (Signed)
Hematology and Oncology Follow Up  ? ?Thomas Michael ?[id number] ?June 29, 1941 81 y.o. ?11/19/2021 11:33 AM ?Thomas Michael, MDWolters, [name], MD  ? ? ? ? ?Principle Diagnosis: 81 year old man with kidney cancer diagnosed in 2012.  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    ?  ?  ?Prior Therapy: ?  ?He is status post right radical nephrectomy in May 2012.  He was found to have stage Ib tumor. ? ?He is status post open treatment of impending pathological fracture of the left proximal femur with intramedullary implant completed on October 01, 2021. ?  ?Current therapy: Pembrolizumab 200 mg every 3 weeks started on September 08, 2021.  He is here for cycle 4 of therapy. ? ? ?Interim History: [name] returns today for repeat evaluation.  Since the last visit, he reports no major changes in his health.  He continues to improve slowly with improvement in his mobility and performance status.  He is using a walker and attending to activities of daily living without any help at this time.  He denies any falls or syncope.  He denies any complications related to Pembrolizumab. ? ? ? ? ?Medications: Reviewed without changes. ?Current Outpatient Medications  ?Medication Sig Dispense Refill  ? Accu-Chek FastClix Lancets MISC check CBG    ? acetaminophen (TYLENOL) 500 MG tablet Take 1,000 mg by mouth every 8 (eight) hours as needed for moderate pain.    ? axitinib (INLYTA) 5 MG tablet Take 1 tablet (5 mg total) by mouth daily. 30 tablet 1  ? Blood Glucose Monitoring Suppl (ACCU-CHEK AVIVA PLUS) w/Device KIT check CBG    ? Cholecalciferol 50 MCG (2000 UT) TABS Take 2,000 Units by mouth daily at 12 noon.    ? ciclopirox (PENLAC) 8 % solution Apply 1 application topically daily as needed (fungus (toes)). Apply over nail and surrounding skin. Apply daily as needed over previous coat.    ? clopidogrel (PLAVIX) 75 MG tablet Take 75 mg by mouth at bedtime.    ?  diphenhydrAMINE (BENADRYL) 25 MG tablet Take 50 mg by mouth at bedtime.    ? docusate sodium (COLACE) 100 MG capsule Take 200 mg by mouth daily as needed for mild constipation.    ? guaiFENesin (MUCINEX) 600 MG 12 hr tablet Take 1,200 mg by mouth 2 (two) times daily as needed for to loosen phlegm or cough.    ? HYDROcodone-acetaminophen (NORCO) 10-325 MG tablet Take 1 tablet by mouth every 6 (six) hours as needed. 60 tablet 0  ? levothyroxine (SYNTHROID, LEVOTHROID) 200 MCG tablet Take 200 mcg by mouth every evening.    ? magic mouthwash w/lidocaine SOLN Take 5 mLs by mouth 4 (four) times daily. 5 ml QID PRN mouth sores, swish and spit 240 mL 0  ? metoprolol (TOPROL-XL) 50 MG 24 hr tablet Take 50 mg by mouth in the morning.    ? Multiple Vitamin (MULTIVITAMIN WITH MINERALS) TABS tablet Take 1 tablet by mouth daily at 12 noon.    ? niacin 500 MG CR capsule Take 500 mg by mouth daily at 12 noon.    ? Omega-3 Fatty Acids (FISH OIL PO) Take 1,000 mg by mouth daily at 12 noon.    ? polyethylene glycol (MIRALAX MIX-IN PAX) 17 g packet Take 17 g by mouth daily. 14 each 0  ? prochlorperazine (COMPAZINE) 10 MG tablet Take 1 tablet (10 mg total) by mouth every 6 (six) hours as  needed for nausea or vomiting. 30 tablet 0  ? rOPINIRole (REQUIP) 0.25 MG tablet Take 0.25 mg by mouth 2 (two) times daily.    ? rosuvastatin (CRESTOR) 20 MG tablet Take 20 mg by mouth in the morning.    ? senna (SENOKOT) 8.6 MG TABS tablet Take 1 tablet (8.6 mg total) by mouth 2 (two) times daily. 60 tablet 0  ? trazodone (DESYREL) 300 MG tablet Take 300 mg by mouth at bedtime.    ? valsartan-hydrochlorothiazide (DIOVAN-HCT) 160-12.5 MG per tablet Take 1 tablet by mouth in the morning.    ? VICTOZA 18 MG/3ML SOPN Inject 1.2 mg into the skin every evening.    ? vitamin E 400 UNIT capsule Take 400 Units by mouth daily at 12 noon.    ? ?No current facility-administered medications for this visit.  ? ? ? ?Allergies:  ?Allergies  ?Allergen Reactions  ?  Oxycodone   ?  Other reaction(s): vomiting  ? Promethazine-Codeine Other (See Comments)  ?  Pt does not remember reaction  ? ?Physical exam ? ? ?Blood pressure (!) 93/53, pulse 66, temperature 98.1 ?F (36.7 ?C), temperature source Tympanic, resp. rate 16, weight 224 lb 14.4 oz (102 kg), SpO2 96 %. ? ? ?ECOG 1 ? ? ? ?General appearance: Comfortable appearing without any discomfort ?Head: Normocephalic without any trauma ?Oropharynx: Mucous membranes are moist and pink without any thrush or ulcers. ?Eyes: Pupils are equal and round reactive to light. ?Lymph nodes: No cervical, supraclavicular, inguinal or axillary lymphadenopathy.   ?Heart:regular rate and rhythm.  S1 and S2 without leg edema. ?Lung: Clear without any rhonchi or wheezes.  No dullness to percussion. ?Abdomin: Soft, nontender, nondistended with good bowel sounds.  No hepatosplenomegaly. ?Musculoskeletal: No joint deformity or effusion.  Full range of motion noted. ?Neurological: No deficits noted on motor, sensory and deep tendon reflex exam. ?Skin: No petechial rash or dryness.  Appeared moist.  ? ? ? ? ?Lab Results: ?Lab Results  ?Component Value Date  ? WBC 7.4 10/27/2021  ? HGB 9.6 (L) 10/27/2021  ? HCT 30.7 (L) 10/27/2021  ? MCV 87.2 10/27/2021  ? PLT 286 10/27/2021  ? ?  Chemistry   ?   ?Component Value Date/Time  ? NA 134 (L) 10/27/2021 1223  ? K 3.3 (L) 10/27/2021 1223  ? CL 98 10/27/2021 1223  ? CO2 27 10/27/2021 1223  ? BUN 9 10/27/2021 1223  ? CREATININE 1.26 (H) 10/27/2021 1223  ?    ?Component Value Date/Time  ? CALCIUM 8.5 (L) 10/27/2021 1223  ? ALKPHOS 103 10/27/2021 1223  ? AST 14 (L) 10/27/2021 1223  ? ALT 11 10/27/2021 1223  ? BILITOT 0.4 10/27/2021 1223  ?  ? ? ? ? ? ?Impression and Plan: ? ?81 year old with: ? ?1.  Kidney cancer diagnosed in 2012.  He developed stage IV intermediate risk clear-cell with bone, pulmonary disease in addition to lymphadenopathy in January 2023. ?  ?He is currently on single agent Pembrolizumab without  any major complications.  Risks and benefits of proceeding with cycle 4 of therapy were reviewed.  Therapy escalation with adding Inlyta or Lenvima were reviewed at this time.  He opted to continue to defer that option and proceed with Pembrolizumab alone.  We will update his staging scan after the next visit. ?  ?2.  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 ?  ?3.  Bone pain: Related to metastatic disease with pain is manageable.  He is currently on hydrocodone which we will refill for him. ?  ?4.  Immune mediated complications: I continue to educate him about complications including pneumonitis, colitis and thyroid disease. ?  ?5.  Follow-up: He will return in 3 weeks for a follow-up visit. ?  ?30  minutes were spent in this encounter.  The time was dedicated to reviewing laboratory data, disease status update and addressing complication related to his cancer and cancer treatment. ? ? ?Zola Button, MD 11/19/2021 11:33 AM ? ?

## 2021-11-19 NOTE — Patient Instructions (Signed)
[facility] MEDICAL ONCOLOGY   ?Discharge Instructions: ?Thank you for choosing [facility] to provide your oncology and hematology care.  ? ?If you have a lab appointment with the Cancer Center, please go directly to the Cancer Center and check in at the registration area. ?  ?Wear comfortable clothing and clothing appropriate for easy access to any Portacath or PICC line.  ? ?We strive to give you quality time with your provider. You may need to reschedule your appointment if you arrive late (15 or more minutes).  Arriving late affects you and other patients whose appointments are after yours.  Also, if you miss three or more appointments without notifying the office, you may be dismissed from the clinic at the provider?s discretion.    ?  ?For prescription refill requests, have your pharmacy contact our office and allow 72 hours for refills to be completed.   ? ?Today you received the following chemotherapy and/or immunotherapy agents: pembrolizumab    ?  ?To help prevent nausea and vomiting after your treatment, we encourage you to take your nausea medication as directed. ? ?BELOW ARE SYMPTOMS THAT SHOULD BE REPORTED IMMEDIATELY: ?*FEVER GREATER THAN 100.4 F (38 ?C) OR HIGHER ?*CHILLS OR SWEATING ?*NAUSEA AND VOMITING THAT IS NOT CONTROLLED WITH YOUR NAUSEA MEDICATION ?*UNUSUAL SHORTNESS OF BREATH ?*UNUSUAL BRUISING OR BLEEDING ?*URINARY PROBLEMS (pain or burning when urinating, or frequent urination) ?*BOWEL PROBLEMS (unusual diarrhea, constipation, pain near the anus) ?TENDERNESS IN MOUTH AND THROAT WITH OR WITHOUT PRESENCE OF ULCERS (sore throat, sores in mouth, or a toothache) ?UNUSUAL RASH, SWELLING OR PAIN  ?UNUSUAL VAGINAL DISCHARGE OR ITCHING  ? ?Items with * indicate a potential emergency and should be followed up as soon as possible or go to the Emergency Department if any problems should occur. ? ?Please show the CHEMOTHERAPY ALERT CARD or IMMUNOTHERAPY ALERT CARD at  check-in to the Emergency Department and triage nurse. ? ?Should you have questions after your visit or need to cancel or reschedule your appointment, please contact [facility]COL[facility]  Dept: [phone]  and follow the prompts.  Office hours are 8:00 a.m. to 4:30 p.m. Monday - Friday. Please note that voicemails left after 4:00 p.m. may not be returned until the following business day.  We are closed weekends and major holidays. You have access to a nurse at all times for urgent questions. Please call the main number to the clinic Dept: [phone] and follow the prompts. ? ? ?For any non-urgent questions, you may also contact your provider using MyChart. We now offer e-Visits for anyone 18 and older to request care online for non-urgent symptoms. For details visit [email or web address]. ?  ?Also download the MyChart app! Go to the app store, search "MyChart", open the app, select , and log in with your MyChart username and password. ? ?Due to Covid, a mask is required upon entering the hospital/clinic. If you do not have a mask, one will be given to you upon arrival. For doctor visits, patients may have 1 support person aged 18 or older with them. For treatment visits, patients cannot have anyone with them due to current Covid guidelines and our immunocompromised population.  ? ?

## 2021-11-23 DIAGNOSIS — M84552D Pathological fracture in neoplastic disease, left femur, subsequent encounter for fracture with routine healing: Secondary | ICD-10-CM | POA: Diagnosis not present

## 2021-11-23 DIAGNOSIS — C786 Secondary malignant neoplasm of retroperitoneum and peritoneum: Secondary | ICD-10-CM | POA: Diagnosis not present

## 2021-11-23 DIAGNOSIS — C7951 Secondary malignant neoplasm of bone: Secondary | ICD-10-CM | POA: Diagnosis not present

## 2021-11-23 DIAGNOSIS — C7971 Secondary malignant neoplasm of right adrenal gland: Secondary | ICD-10-CM | POA: Diagnosis not present

## 2021-11-23 DIAGNOSIS — C7972 Secondary malignant neoplasm of left adrenal gland: Secondary | ICD-10-CM | POA: Diagnosis not present

## 2021-11-23 DIAGNOSIS — I119 Hypertensive heart disease without heart failure: Secondary | ICD-10-CM | POA: Diagnosis not present

## 2021-11-24 ENCOUNTER — Telehealth: Payer: Self-pay | Admitting: Oncology

## 2021-11-24 DIAGNOSIS — C7972 Secondary malignant neoplasm of left adrenal gland: Secondary | ICD-10-CM | POA: Diagnosis not present

## 2021-11-24 DIAGNOSIS — C7951 Secondary malignant neoplasm of bone: Secondary | ICD-10-CM | POA: Diagnosis not present

## 2021-11-24 DIAGNOSIS — C7971 Secondary malignant neoplasm of right adrenal gland: Secondary | ICD-10-CM | POA: Diagnosis not present

## 2021-11-24 DIAGNOSIS — I119 Hypertensive heart disease without heart failure: Secondary | ICD-10-CM | POA: Diagnosis not present

## 2021-11-24 DIAGNOSIS — C786 Secondary malignant neoplasm of retroperitoneum and peritoneum: Secondary | ICD-10-CM | POA: Diagnosis not present

## 2021-11-24 DIAGNOSIS — M84552D Pathological fracture in neoplastic disease, left femur, subsequent encounter for fracture with routine healing: Secondary | ICD-10-CM | POA: Diagnosis not present

## 2021-11-24 NOTE — Telephone Encounter (Signed)
.  Called patient to schedule appointment per 5/8 inbasket, patient is aware of date and time.   ?

## 2021-11-27 ENCOUNTER — Other Ambulatory Visit: Payer: Self-pay | Admitting: Oncology

## 2021-11-27 MED ORDER — HYDROCOD POLI-CHLORPHE POLI ER 10-8 MG/5ML PO SUER: 5.0000 mL | Freq: Every evening | ORAL | 0 refills | Status: AC | PRN

## 2021-11-30 DIAGNOSIS — C7971 Secondary malignant neoplasm of right adrenal gland: Secondary | ICD-10-CM | POA: Diagnosis not present

## 2021-11-30 DIAGNOSIS — C786 Secondary malignant neoplasm of retroperitoneum and peritoneum: Secondary | ICD-10-CM | POA: Diagnosis not present

## 2021-11-30 DIAGNOSIS — M84552D Pathological fracture in neoplastic disease, left femur, subsequent encounter for fracture with routine healing: Secondary | ICD-10-CM | POA: Diagnosis not present

## 2021-11-30 DIAGNOSIS — C7951 Secondary malignant neoplasm of bone: Secondary | ICD-10-CM | POA: Diagnosis not present

## 2021-11-30 DIAGNOSIS — C7972 Secondary malignant neoplasm of left adrenal gland: Secondary | ICD-10-CM | POA: Diagnosis not present

## 2021-11-30 DIAGNOSIS — I119 Hypertensive heart disease without heart failure: Secondary | ICD-10-CM | POA: Diagnosis not present

## 2021-12-08 DIAGNOSIS — I10 Essential (primary) hypertension: Secondary | ICD-10-CM | POA: Diagnosis not present

## 2021-12-08 DIAGNOSIS — G629 Polyneuropathy, unspecified: Secondary | ICD-10-CM | POA: Diagnosis not present

## 2021-12-08 DIAGNOSIS — Z0000 Encounter for general adult medical examination without abnormal findings: Secondary | ICD-10-CM | POA: Diagnosis not present

## 2021-12-08 DIAGNOSIS — E782 Mixed hyperlipidemia: Secondary | ICD-10-CM | POA: Diagnosis not present

## 2021-12-08 DIAGNOSIS — E1169 Type 2 diabetes mellitus with other specified complication: Secondary | ICD-10-CM | POA: Diagnosis not present

## 2021-12-08 DIAGNOSIS — D692 Other nonthrombocytopenic purpura: Secondary | ICD-10-CM | POA: Diagnosis not present

## 2021-12-08 DIAGNOSIS — E1140 Type 2 diabetes mellitus with diabetic neuropathy, unspecified: Secondary | ICD-10-CM | POA: Diagnosis not present

## 2021-12-08 DIAGNOSIS — I129 Hypertensive chronic kidney disease with stage 1 through stage 4 chronic kidney disease, or unspecified chronic kidney disease: Secondary | ICD-10-CM | POA: Diagnosis not present

## 2021-12-08 DIAGNOSIS — E039 Hypothyroidism, unspecified: Secondary | ICD-10-CM | POA: Diagnosis not present

## 2021-12-08 DIAGNOSIS — I2510 Atherosclerotic heart disease of native coronary artery without angina pectoris: Secondary | ICD-10-CM | POA: Diagnosis not present

## 2021-12-08 DIAGNOSIS — C641 Malignant neoplasm of right kidney, except renal pelvis: Secondary | ICD-10-CM | POA: Diagnosis not present

## 2021-12-08 DIAGNOSIS — Z79899 Other long term (current) drug therapy: Secondary | ICD-10-CM | POA: Diagnosis not present

## 2021-12-09 ENCOUNTER — Other Ambulatory Visit: Payer: Self-pay | Admitting: *Deleted

## 2021-12-09 DIAGNOSIS — C649 Malignant neoplasm of unspecified kidney, except renal pelvis: Secondary | ICD-10-CM

## 2021-12-09 MED ORDER — HYDROCODONE-ACETAMINOPHEN 10-325 MG PO TABS: 1.0000 | ORAL_TABLET | Freq: Four times a day (QID) | ORAL | 0 refills | Status: DC | PRN

## 2021-12-10 ENCOUNTER — Inpatient Hospital Stay: Payer: Medicare Other

## 2021-12-10 ENCOUNTER — Other Ambulatory Visit: Payer: Self-pay

## 2021-12-10 ENCOUNTER — Inpatient Hospital Stay (HOSPITAL_BASED_OUTPATIENT_CLINIC_OR_DEPARTMENT_OTHER): Payer: Medicare Other | Admitting: Oncology

## 2021-12-10 VITALS — BP 83/43 | HR 65 | Temp 98.5°F | Resp 17 | Ht 66.0 in | Wt 224.1 lb

## 2021-12-10 DIAGNOSIS — C649 Malignant neoplasm of unspecified kidney, except renal pelvis: Secondary | ICD-10-CM | POA: Diagnosis not present

## 2021-12-10 DIAGNOSIS — D649 Anemia, unspecified: Secondary | ICD-10-CM | POA: Diagnosis not present

## 2021-12-10 DIAGNOSIS — Z5112 Encounter for antineoplastic immunotherapy: Secondary | ICD-10-CM | POA: Diagnosis not present

## 2021-12-10 DIAGNOSIS — C7951 Secondary malignant neoplasm of bone: Secondary | ICD-10-CM | POA: Diagnosis not present

## 2021-12-10 DIAGNOSIS — Z85528 Personal history of other malignant neoplasm of kidney: Secondary | ICD-10-CM | POA: Diagnosis not present

## 2021-12-10 DIAGNOSIS — R918 Other nonspecific abnormal finding of lung field: Secondary | ICD-10-CM | POA: Diagnosis not present

## 2021-12-10 DIAGNOSIS — E032 Hypothyroidism due to medicaments and other exogenous substances: Secondary | ICD-10-CM

## 2021-12-10 DIAGNOSIS — Z905 Acquired absence of kidney: Secondary | ICD-10-CM | POA: Diagnosis not present

## 2021-12-10 DIAGNOSIS — N2889 Other specified disorders of kidney and ureter: Secondary | ICD-10-CM | POA: Diagnosis not present

## 2021-12-10 DIAGNOSIS — C7800 Secondary malignant neoplasm of unspecified lung: Secondary | ICD-10-CM | POA: Diagnosis not present

## 2021-12-10 DIAGNOSIS — M899 Disorder of bone, unspecified: Secondary | ICD-10-CM | POA: Diagnosis not present

## 2021-12-10 LAB — CMP (CANCER CENTER ONLY)
ALT: 9 U/L (ref 0–44)
AST: 12 U/L — ABNORMAL LOW (ref 15–41)
Albumin: 3.7 g/dL (ref 3.5–5.0)
Alkaline Phosphatase: 78 U/L (ref 38–126)
Anion gap: 8 (ref 5–15)
BUN: 18 mg/dL (ref 8–23)
CO2: 25 mmol/L (ref 22–32)
Calcium: 9 mg/dL (ref 8.9–10.3)
Chloride: 101 mmol/L (ref 98–111)
Creatinine: 1.35 mg/dL — ABNORMAL HIGH (ref 0.61–1.24)
GFR, Estimated: 53 mL/min — ABNORMAL LOW (ref >60)
Glucose, Bld: 178 mg/dL — ABNORMAL HIGH (ref 70–99)
Potassium: 4 mmol/L (ref 3.5–5.1)
Sodium: 134 mmol/L — ABNORMAL LOW (ref 135–145)
Total Bilirubin: 0.4 mg/dL (ref 0.3–1.2)
Total Protein: 7.2 g/dL (ref 6.5–8.1)

## 2021-12-10 LAB — CBC WITH DIFFERENTIAL (CANCER CENTER ONLY)
Abs Immature Granulocytes: 0.02 10*3/uL (ref 0.00–0.07)
Basophils Absolute: 0.1 10*3/uL (ref 0.0–0.1)
Basophils Relative: 1 %
Eosinophils Absolute: 0.2 10*3/uL (ref 0.0–0.5)
Eosinophils Relative: 3 %
HCT: 26.5 % — ABNORMAL LOW (ref 39.0–52.0)
Hemoglobin: 8.3 g/dL — ABNORMAL LOW (ref 13.0–17.0)
Immature Granulocytes: 0 %
Lymphocytes Relative: 14 %
Lymphs Abs: 1.1 10*3/uL (ref 0.7–4.0)
MCH: 25.7 pg — ABNORMAL LOW (ref 26.0–34.0)
MCHC: 31.3 g/dL (ref 30.0–36.0)
MCV: 82 fL (ref 80.0–100.0)
Monocytes Absolute: 0.6 10*3/uL (ref 0.1–1.0)
Monocytes Relative: 7 %
Neutro Abs: 5.8 10*3/uL (ref 1.7–7.7)
Neutrophils Relative %: 75 %
Platelet Count: 278 10*3/uL (ref 150–400)
RBC: 3.23 MIL/uL — ABNORMAL LOW (ref 4.22–5.81)
RDW: 14.6 % (ref 11.5–15.5)
WBC Count: 7.7 10*3/uL (ref 4.0–10.5)
nRBC: 0 % (ref 0.0–0.2)

## 2021-12-10 LAB — TSH: TSH: 3.993 u[IU]/mL (ref 0.350–4.500)

## 2021-12-10 MED ORDER — HEPARIN SOD (PORK) LOCK FLUSH 100 UNIT/ML IV SOLN: 500.0000 [IU] | Freq: Once | INTRAVENOUS | Status: DC | PRN

## 2021-12-10 MED ORDER — SODIUM CHLORIDE 0.9 % IV SOLN: Freq: Once | INTRAVENOUS | Status: AC

## 2021-12-10 MED ORDER — SODIUM CHLORIDE 0.9% FLUSH: 10.0000 mL | INTRAVENOUS | Status: DC | PRN

## 2021-12-10 MED ORDER — SODIUM CHLORIDE 0.9 % IV SOLN
200.0000 mg | Freq: Once | INTRAVENOUS | Status: AC
  Administered 2021-12-10: 200 mg via INTRAVENOUS
  Filled 2021-12-10: qty 8

## 2021-12-10 NOTE — Patient Instructions (Signed)
[facility]COL[facility]  Discharge Instructions: ?Thank you for choosing [facility] to provide your oncology and hematology care.  ? ?If you have a lab appointment with the Cancer Center, please go directly to the Cancer Center and check in at the registration area. ?  ?Wear comfortable clothing and clothing appropriate for easy access to any Portacath or PICC line.  ? ?We strive to give you quality time with your provider. You may need to reschedule your appointment if you arrive late (15 or more minutes).  Arriving late affects you and other patients whose appointments are after yours.  Also, if you miss three or more appointments without notifying the office, you may be dismissed from the clinic at the provider?s discretion.    ?  ?For prescription refill requests, have your pharmacy contact our office and allow 72 hours for refills to be completed.   ? ?Today you received the following chemotherapy and/or immunotherapy agents: Keytruda ?  ?To help prevent nausea and vomiting after your treatment, we encourage you to take your nausea medication as directed. ? ?BELOW ARE SYMPTOMS THAT SHOULD BE REPORTED IMMEDIATELY: ?*FEVER GREATER THAN 100.4 F (38 ?C) OR HIGHER ?*CHILLS OR SWEATING ?*NAUSEA AND VOMITING THAT IS NOT CONTROLLED WITH YOUR NAUSEA MEDICATION ?*UNUSUAL SHORTNESS OF BREATH ?*UNUSUAL BRUISING OR BLEEDING ?*URINARY PROBLEMS (pain or burning when urinating, or frequent urination) ?*BOWEL PROBLEMS (unusual diarrhea, constipation, pain near the anus) ?TENDERNESS IN MOUTH AND THROAT WITH OR WITHOUT PRESENCE OF ULCERS (sore throat, sores in mouth, or a toothache) ?UNUSUAL RASH, SWELLING OR PAIN  ?UNUSUAL VAGINAL DISCHARGE OR ITCHING  ? ?Items with * indicate a potential emergency and should be followed up as soon as possible or go to the Emergency Department if any problems should occur. ? ?Please show the CHEMOTHERAPY ALERT CARD or IMMUNOTHERAPY ALERT CARD at check-in to the  Emergency Department and triage nurse. ? ?Should you have questions after your visit or need to cancel or reschedule your appointment, please contact [facility]  Dept: [phone]  and follow the prompts.  Office hours are 8:00 a.m. to 4:30 p.m. Monday - Friday. Please note that voicemails left after 4:00 p.m. may not be returned until the following business day.  We are closed weekends and major holidays. You have access to a nurse at all times for urgent questions. Please call the main number to the clinic Dept: [phone] and follow the prompts. ? ? ?For any non-urgent questions, you may also contact your provider using MyChart. We now offer e-Visits for anyone 18 and older to request care online for non-urgent symptoms. For details visit [email or web address]. ?  ?Also download the MyChart app! Go to the app store, search "MyChart", open the app, select , and log in with your MyChart username and password. ? ?Due to Covid, a mask is required upon entering the hospital/clinic. If you do not have a mask, one will be given to you upon arrival. For doctor visits, patients may have 1 support person aged 18 or older with them. For treatment visits, patients cannot have anyone with them due to current Covid guidelines and our immunocompromised population.  ? ?

## 2021-12-10 NOTE — Progress Notes (Signed)
Hematology and Oncology Follow Up   Thomas Michael [id number] 1940/11/18 81 y.o. 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        Prior Therapy:   He is status post right radical nephrectomy in May 2012.  He was found to have stage Ib tumor.  He is status post open treatment of impending pathological fracture of the left proximal femur with intramedullary implant completed on October 01, 2021.   Current therapy: Pembrolizumab 200 mg every 3 weeks started on September 08, 2021.  He is here for cycle 5 of therapy.   Interim History: Thomas Michael returns today for a follow-up visit.  Since the last visit, he reports no major changes in his health.  He continues to improve with physical therapy and has regained most activities of daily living.  He denies any worsening bone pain or pathological fractures.  He denies any falls or syncope.  He does not require hydrocodone 3-4 times a day.  He denies skin rash or lesions.  He denies any changes in his bowel habits.  He denies diarrhea constipation.     Medications: Updated on review. Current Outpatient Medications  Medication Sig Dispense Refill   Accu-Chek FastClix Lancets MISC check CBG     acetaminophen (TYLENOL) 500 MG tablet Take 1,000 mg by mouth every 8 (eight) hours as needed for moderate pain.     axitinib (INLYTA) 5 MG tablet Take 1 tablet (5 mg total) by mouth daily. 30 tablet 1   Blood Glucose Monitoring Suppl (ACCU-CHEK AVIVA PLUS) w/Device KIT check CBG     chlorpheniramine-HYDROcodone 10-8 MG/5ML Take 5 mLs by mouth at bedtime as needed for cough. 473 mL 0   Cholecalciferol 50 MCG (2000 UT) TABS Take 2,000 Units by mouth daily at 12 noon.     ciclopirox (PENLAC) 8 % solution Apply 1 application topically daily as needed (fungus (toes)). Apply over nail and  surrounding skin. Apply daily as needed over previous coat.     clopidogrel (PLAVIX) 75 MG tablet Take 75 mg by mouth at bedtime.     diphenhydrAMINE (BENADRYL) 25 MG tablet Take 50 mg by mouth at bedtime.     docusate sodium (COLACE) 100 MG capsule Take 200 mg by mouth daily as needed for mild constipation.     guaiFENesin (MUCINEX) 600 MG 12 hr tablet Take 1,200 mg by mouth 2 (two) times daily as needed for to loosen phlegm or cough.     HYDROcodone-acetaminophen (NORCO) 10-325 MG tablet Take 1 tablet by mouth every 6 (six) hours as needed. 60 tablet 0   levothyroxine (SYNTHROID, LEVOTHROID) 200 MCG tablet Take 200 mcg by mouth every evening.     magic mouthwash w/lidocaine SOLN Take 5 mLs by mouth 4 (four) times daily. 5 ml QID PRN mouth sores, swish and spit 240 mL 0   metoprolol (TOPROL-XL) 50 MG 24 hr tablet Take 50 mg by mouth in the morning.     Multiple Vitamin (MULTIVITAMIN WITH MINERALS) TABS tablet Take 1 tablet by mouth daily at 12 noon.     niacin 500 MG CR capsule Take 500 mg by mouth daily at 12 noon.     nystatin-triamcinolone ointment (MYCOLOG) Apply 1 application. topically 2 (two) times daily. 30 g 3   Omega-3 Fatty Acids (FISH OIL PO) Take 1,000 mg by mouth daily  at 12 noon.     polyethylene glycol (MIRALAX MIX-IN PAX) 17 g packet Take 17 g by mouth daily. 14 each 0   prochlorperazine (COMPAZINE) 10 MG tablet Take 1 tablet (10 mg total) by mouth every 6 (six) hours as needed for nausea or vomiting. 30 tablet 0   rOPINIRole (REQUIP) 0.25 MG tablet Take 0.25 mg by mouth 2 (two) times daily.     rosuvastatin (CRESTOR) 20 MG tablet Take 20 mg by mouth in the morning.     senna (SENOKOT) 8.6 MG TABS tablet Take 1 tablet (8.6 mg total) by mouth 2 (two) times daily. 60 tablet 0   trazodone (DESYREL) 300 MG tablet Take 300 mg by mouth at bedtime.     valsartan-hydrochlorothiazide (DIOVAN-HCT) 160-12.5 MG per tablet Take 1 tablet by mouth in the morning.     VICTOZA 18 MG/3ML SOPN  Inject 1.2 mg into the skin every evening.     vitamin E 400 UNIT capsule Take 400 Units by mouth daily at 12 noon.     No current facility-administered medications for this visit.     Allergies:  Allergies  Allergen Reactions   Oxycodone     Other reaction(s): vomiting   Promethazine-Codeine Other (See Comments)    Pt does not remember reaction   Physical exam   Blood pressure (!) 83/43, pulse 65, temperature 98.5 F (36.9 C), temperature source Axillary, resp. rate 17, height _0  (1.676 m), weight 224 lb 1.6 oz (101.7 kg), SpO2 95 %.    ECOG 1   General appearance: Alert, awake without any distress. Head: Atraumatic without abnormalities Oropharynx: Without any thrush or ulcers. Eyes: No scleral icterus. Lymph nodes: No lymphadenopathy noted in the cervical, supraclavicular, or axillary nodes Heart:regular rate and rhythm, without any murmurs or gallops.   Lung: Clear to auscultation without any rhonchi, wheezes or dullness to percussion. Abdomin: Soft, nontender without any shifting dullness or ascites. Musculoskeletal: No clubbing or cyanosis. Neurological: No motor or sensory deficits. Skin: No rashes or lesions.     Lab Results: Lab Results  Component Value Date   WBC 7.5 11/19/2021   HGB 10.1 (L) 11/19/2021   HCT 31.8 (L) 11/19/2021   MCV 85.9 11/19/2021   PLT 325 11/19/2021     Chemistry      Component Value Date/Time   NA 134 (L) 11/19/2021 1116   K 3.8 11/19/2021 1116   CL 99 11/19/2021 1116   CO2 27 11/19/2021 1116   BUN 13 11/19/2021 1116   CREATININE 1.40 (H) 11/19/2021 1116      Component Value Date/Time   CALCIUM 9.1 11/19/2021 1116   ALKPHOS 88 11/19/2021 1116   AST 13 (L) 11/19/2021 1116   ALT 11 11/19/2021 1116   BILITOT 0.4 11/19/2021 1116         Impression and Plan:  81 year old with:  1.  Stage IV intermediate risk clear-cell renal cell carcinoma diagnosed in January 2023.  He presented with bone, pulmonary disease in  addition to lymphadenopathy in January 2023.   He is currently on Pembrolizumab without any major complications.  Risks and benefits of proceeding with treatment at this time were reviewed.  He is agreeable to proceed to and I will update his staging scan before the next visit.  Risks and benefits of adding Inlyta was discussed today again in detail.  Complications that include GI, dermatological toxicity as well as hypertension were reviewed.  At this time the option is to defer adding this  treatment to his scan is completed to assess whether he is responding on Pembrolizumab alone.   2.  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  He continues to participate in physical therapy.   3.  Bone pain: Pain is manageable with hydrocodone.   4.  Immune mediated complications: He has not experienced any complication clued pneumonitis, colitis and thyroid disease.  5.  Anemia: Etiology is likely multifactorial related to chronic kidney disease, malignancy possible hypothyroidism.  The plan is to update his iron studies and replace as needed.  Growth factor support could be considered.  6.  Hypotension: He is currently on blood pressure medication that is being adjusted by Dr. Cheron Schaumann.   7.  Follow-up: In 3 weeks for repeat follow-up.   30  minutes were dedicated to this visit.  The time spent on reviewing laboratory data, disease status update and outlining future plan of care discussion.   Zola Button, MD 12/10/2021 10:59 AM

## 2021-12-22 ENCOUNTER — Other Ambulatory Visit: Payer: Self-pay | Admitting: *Deleted

## 2021-12-22 DIAGNOSIS — C649 Malignant neoplasm of unspecified kidney, except renal pelvis: Secondary | ICD-10-CM

## 2021-12-23 MED ORDER — HYDROCODONE-ACETAMINOPHEN 10-325 MG PO TABS: 1.0000 | ORAL_TABLET | Freq: Four times a day (QID) | ORAL | 0 refills | Status: DC | PRN

## 2021-12-25 ENCOUNTER — Ambulatory Visit (HOSPITAL_COMMUNITY)
Admission: RE | Admit: 2021-12-25 | Discharge: 2021-12-25 | Disposition: A | Discharge status: Home / Self Care | Payer: Medicare Other | Source: Ambulatory Visit | Attending: Oncology | Admitting: Oncology

## 2021-12-25 ENCOUNTER — Telehealth: Payer: Self-pay | Admitting: Oncology

## 2021-12-25 DIAGNOSIS — K5710 Diverticulosis of small intestine without perforation or abscess without bleeding: Secondary | ICD-10-CM | POA: Diagnosis not present

## 2021-12-25 DIAGNOSIS — R918 Other nonspecific abnormal finding of lung field: Secondary | ICD-10-CM | POA: Diagnosis not present

## 2021-12-25 DIAGNOSIS — C649 Malignant neoplasm of unspecified kidney, except renal pelvis: Secondary | ICD-10-CM

## 2021-12-25 DIAGNOSIS — N2889 Other specified disorders of kidney and ureter: Secondary | ICD-10-CM

## 2021-12-25 DIAGNOSIS — R911 Solitary pulmonary nodule: Secondary | ICD-10-CM | POA: Diagnosis not present

## 2021-12-25 DIAGNOSIS — M899 Disorder of bone, unspecified: Secondary | ICD-10-CM | POA: Diagnosis not present

## 2021-12-25 MED ORDER — IOHEXOL 300 MG/ML  SOLN
100.0000 mL | Freq: Once | INTRAMUSCULAR | Status: AC | PRN
  Administered 2021-12-25: 100 mL via INTRAVENOUS

## 2021-12-25 NOTE — Telephone Encounter (Signed)
.  Called pt per 5/26 los , Patient was unavailable, a message with appt time and date was left with number on file.

## 2021-12-29 ENCOUNTER — Ambulatory Visit: Payer: Medicare Other | Admitting: Internal Medicine

## 2021-12-30 ENCOUNTER — Inpatient Hospital Stay (HOSPITAL_BASED_OUTPATIENT_CLINIC_OR_DEPARTMENT_OTHER): Payer: Medicare Other | Admitting: Oncology

## 2021-12-30 ENCOUNTER — Inpatient Hospital Stay: Payer: Medicare Other

## 2021-12-30 ENCOUNTER — Inpatient Hospital Stay: Payer: Medicare Other | Attending: Physician Assistant

## 2021-12-30 ENCOUNTER — Other Ambulatory Visit: Payer: Self-pay

## 2021-12-30 VITALS — BP 105/48 | HR 68 | Temp 98.4°F | Resp 17 | Wt 218.1 lb

## 2021-12-30 DIAGNOSIS — G893 Neoplasm related pain (acute) (chronic): Secondary | ICD-10-CM | POA: Insufficient documentation

## 2021-12-30 DIAGNOSIS — C641 Malignant neoplasm of right kidney, except renal pelvis: Secondary | ICD-10-CM | POA: Diagnosis not present

## 2021-12-30 DIAGNOSIS — C772 Secondary and unspecified malignant neoplasm of intra-abdominal lymph nodes: Secondary | ICD-10-CM | POA: Diagnosis not present

## 2021-12-30 DIAGNOSIS — C649 Malignant neoplasm of unspecified kidney, except renal pelvis: Secondary | ICD-10-CM

## 2021-12-30 DIAGNOSIS — I959 Hypotension, unspecified: Secondary | ICD-10-CM | POA: Insufficient documentation

## 2021-12-30 DIAGNOSIS — Z5112 Encounter for antineoplastic immunotherapy: Secondary | ICD-10-CM | POA: Diagnosis not present

## 2021-12-30 DIAGNOSIS — C7800 Secondary malignant neoplasm of unspecified lung: Secondary | ICD-10-CM | POA: Insufficient documentation

## 2021-12-30 DIAGNOSIS — E032 Hypothyroidism due to medicaments and other exogenous substances: Secondary | ICD-10-CM

## 2021-12-30 DIAGNOSIS — Z905 Acquired absence of kidney: Secondary | ICD-10-CM | POA: Diagnosis not present

## 2021-12-30 DIAGNOSIS — Z79899 Other long term (current) drug therapy: Secondary | ICD-10-CM | POA: Diagnosis not present

## 2021-12-30 DIAGNOSIS — D630 Anemia in neoplastic disease: Secondary | ICD-10-CM | POA: Diagnosis not present

## 2021-12-30 DIAGNOSIS — C7951 Secondary malignant neoplasm of bone: Secondary | ICD-10-CM | POA: Insufficient documentation

## 2021-12-30 LAB — CMP (CANCER CENTER ONLY)
ALT: 10 U/L (ref 0–44)
AST: 12 U/L — ABNORMAL LOW (ref 15–41)
Albumin: 3.7 g/dL (ref 3.5–5.0)
Alkaline Phosphatase: 77 U/L (ref 38–126)
Anion gap: 11 (ref 5–15)
BUN: 10 mg/dL (ref 8–23)
CO2: 25 mmol/L (ref 22–32)
Calcium: 9.2 mg/dL (ref 8.9–10.3)
Chloride: 99 mmol/L (ref 98–111)
Creatinine: 1.36 mg/dL — ABNORMAL HIGH (ref 0.61–1.24)
GFR, Estimated: 53 mL/min — ABNORMAL LOW (ref >60)
Glucose, Bld: 169 mg/dL — ABNORMAL HIGH (ref 70–99)
Potassium: 3.2 mmol/L — ABNORMAL LOW (ref 3.5–5.1)
Sodium: 135 mmol/L (ref 135–145)
Total Bilirubin: 0.4 mg/dL (ref 0.3–1.2)
Total Protein: 7.6 g/dL (ref 6.5–8.1)

## 2021-12-30 LAB — CBC WITH DIFFERENTIAL (CANCER CENTER ONLY)
Abs Immature Granulocytes: 0.03 10*3/uL (ref 0.00–0.07)
Basophils Absolute: 0 10*3/uL (ref 0.0–0.1)
Basophils Relative: 1 %
Eosinophils Absolute: 0.2 10*3/uL (ref 0.0–0.5)
Eosinophils Relative: 3 %
HCT: 26.8 % — ABNORMAL LOW (ref 39.0–52.0)
Hemoglobin: 8.4 g/dL — ABNORMAL LOW (ref 13.0–17.0)
Immature Granulocytes: 0 %
Lymphocytes Relative: 13 %
Lymphs Abs: 0.9 10*3/uL (ref 0.7–4.0)
MCH: 24.9 pg — ABNORMAL LOW (ref 26.0–34.0)
MCHC: 31.3 g/dL (ref 30.0–36.0)
MCV: 79.3 fL — ABNORMAL LOW (ref 80.0–100.0)
Monocytes Absolute: 0.5 10*3/uL (ref 0.1–1.0)
Monocytes Relative: 8 %
Neutro Abs: 5.2 10*3/uL (ref 1.7–7.7)
Neutrophils Relative %: 75 %
Platelet Count: 350 10*3/uL (ref 150–400)
RBC: 3.38 MIL/uL — ABNORMAL LOW (ref 4.22–5.81)
RDW: 15.6 % — ABNORMAL HIGH (ref 11.5–15.5)
WBC Count: 6.8 10*3/uL (ref 4.0–10.5)
nRBC: 0 % (ref 0.0–0.2)

## 2021-12-30 LAB — FERRITIN: Ferritin: 9 ng/mL — ABNORMAL LOW (ref 24–336)

## 2021-12-30 LAB — IRON AND IRON BINDING CAPACITY (CC-WL,HP ONLY)
Iron: 7 ug/dL — ABNORMAL LOW (ref 45–182)
Saturation Ratios: 2 % — ABNORMAL LOW (ref 17.9–39.5)
TIBC: 378 ug/dL (ref 250–450)
UIBC: 371 ug/dL (ref 117–376)

## 2021-12-30 LAB — TSH: TSH: 6.93 u[IU]/mL — ABNORMAL HIGH (ref 0.350–4.500)

## 2021-12-30 MED ORDER — SODIUM CHLORIDE 0.9 % IV SOLN: Freq: Once | INTRAVENOUS | Status: AC

## 2021-12-30 MED ORDER — SODIUM CHLORIDE 0.9 % IV SOLN
200.0000 mg | Freq: Once | INTRAVENOUS | Status: AC
  Administered 2021-12-30: 200 mg via INTRAVENOUS
  Filled 2021-12-30: qty 200

## 2021-12-30 NOTE — Patient Instructions (Signed)
[facility]  Discharge Instructions: ?Thank you for choosing [facility] to provide your oncology and hematology care.  ? ?If you have a lab appointment with the Cancer Center, please go directly to the Cancer Center and check in at the registration area. ?  ?Wear comfortable clothing and clothing appropriate for easy access to any Portacath or PICC line.  ? ?We strive to give you quality time with your provider. You may need to reschedule your appointment if you arrive late (15 or more minutes).  Arriving late affects you and other patients whose appointments are after yours.  Also, if you miss three or more appointments without notifying the office, you may be dismissed from the clinic at the provider?s discretion.    ?  ?For prescription refill requests, have your pharmacy contact our office and allow 72 hours for refills to be completed.   ? ?Today you received the following chemotherapy and/or immunotherapy agents: Keytruda ?  ?To help prevent nausea and vomiting after your treatment, we encourage you to take your nausea medication as directed. ? ?BELOW ARE SYMPTOMS THAT SHOULD BE REPORTED IMMEDIATELY: ?*FEVER GREATER THAN 100.4 F (38 ?C) OR HIGHER ?*CHILLS OR SWEATING ?*NAUSEA AND VOMITING THAT IS NOT CONTROLLED WITH YOUR NAUSEA MEDICATION ?*UNUSUAL SHORTNESS OF BREATH ?*UNUSUAL BRUISING OR BLEEDING ?*URINARY PROBLEMS (pain or burning when urinating, or frequent urination) ?*BOWEL PROBLEMS (unusual diarrhea, constipation, pain near the anus) ?TENDERNESS IN MOUTH AND THROAT WITH OR WITHOUT PRESENCE OF ULCERS (sore throat, sores in mouth, or a toothache) ?UNUSUAL RASH, SWELLING OR PAIN  ?UNUSUAL VAGINAL DISCHARGE OR ITCHING  ? ?Items with * indicate a potential emergency and should be followed up as soon as possible or go to the Emergency Department if any problems should occur. ? ?Please show the CHEMOTHERAPY ALERT CARD or IMMUNOTHERAPY ALERT CARD at check-in to the  Emergency Department and triage nurse. ? ?Should you have questions after your visit or need to cancel or reschedule your appointment, please contact [facility]  Dept: [phone]  and follow the prompts.  Office hours are 8:00 a.m. to 4:30 p.m. Monday - Friday. Please note that voicemails left after 4:00 p.m. may not be returned until the following business day.  We are closed weekends and major holidays. You have access to a nurse at all times for urgent questions. Please call the main number to the clinic Dept: [phone] and follow the prompts. ? ? ?For any non-urgent questions, you may also contact your provider using MyChart. We now offer e-Visits for anyone 18 and older to request care online for non-urgent symptoms. For details visit [email or web address]. ?  ?Also download the MyChart app! Go to the app store, search "MyChart", open the app, select [place], and log in with your MyChart username and password. ? ?Due to Covid, a mask is required upon entering the hospital/clinic. If you do not have a mask, one will be given to you upon arrival. For doctor visits, patients may have 1 support person aged 18 or older with them. For treatment visits, patients cannot have anyone with them due to current Covid guidelines and our immunocompromised population.  ? ?

## 2021-12-30 NOTE — Progress Notes (Signed)
Hematology and Oncology Follow Up   Thomas Michael [id number] [birth year] 81 y.o. 12/30/2021 9:53 AM Thomas Michael, MDWolters, [name], MD      Principle Diagnosis: 81 year old man with kidney cancer diagnosed in January 2023.  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     Prior Therapy:   He is status post right radical nephrectomy in May 2012.  He was found to have stage Ib tumor.  He is status post open treatment of impending pathological fracture of the left proximal femur with intramedullary implant completed on October 01, 2021.   Current therapy: Pembrolizumab 200 mg every 3 weeks started on September 08, 2021.  He is here for cycle 6 of therapy.   Interim History: Mr. Stay presents today for repeat evaluation.  Since the last visit, he reports no major changes in his health.  He continues to tolerate therapy without any complaints.  He denies any nausea, vomiting or abdominal pain.  His pain is manageable with hydrocodone.  He is ambulating with the help of a walker without any falls or syncope.  He denies any shortness of breath or difficulty breathing.  His performance status quality of life remains unchanged.     Medications: Reviewed without changes. Current Outpatient Medications  Medication Sig Dispense Refill   Accu-Chek FastClix Lancets MISC check CBG     acetaminophen (TYLENOL) 500 MG tablet Take 1,000 mg by mouth every 8 (eight) hours as needed for moderate pain.     axitinib (INLYTA) 5 MG tablet Take 1 tablet (5 mg total) by mouth daily. 30 tablet 1   Blood Glucose Monitoring Suppl (ACCU-CHEK AVIVA PLUS) w/Device KIT check CBG     chlorpheniramine-HYDROcodone 10-8 MG/5ML Take 5 mLs by mouth at bedtime as needed for cough. 473 mL 0   Cholecalciferol 50 MCG (2000 UT) TABS Take 2,000 Units by mouth daily at 12 noon.     ciclopirox (PENLAC) 8 % solution Apply 1 application topically daily as needed (fungus  (toes)). Apply over nail and surrounding skin. Apply daily as needed over previous coat.     clopidogrel (PLAVIX) 75 MG tablet Take 75 mg by mouth at bedtime.     diphenhydrAMINE (BENADRYL) 25 MG tablet Take 50 mg by mouth at bedtime.     docusate sodium (COLACE) 100 MG capsule Take 200 mg by mouth daily as needed for mild constipation.     guaiFENesin (MUCINEX) 600 MG 12 hr tablet Take 1,200 mg by mouth 2 (two) times daily as needed for to loosen phlegm or cough.     HYDROcodone-acetaminophen (NORCO) 10-325 MG tablet Take 1 tablet by mouth every 6 (six) hours as needed. 60 tablet 0   levothyroxine (SYNTHROID, LEVOTHROID) 200 MCG tablet Take 200 mcg by mouth every evening.     magic mouthwash w/lidocaine SOLN Take 5 mLs by mouth 4 (four) times daily. 5 ml QID PRN mouth sores, swish and spit 240 mL 0   metoprolol (TOPROL-XL) 50 MG 24 hr tablet Take 50 mg by mouth in the morning.     Multiple Vitamin (MULTIVITAMIN WITH MINERALS) TABS tablet Take 1 tablet by mouth daily at 12 noon.     niacin 500 MG CR capsule Take 500 mg by mouth daily at 12 noon.     nystatin-triamcinolone ointment (MYCOLOG) Apply 1 application. topically 2 (two) times daily. 30 g 3   Omega-3 Fatty Acids (FISH OIL PO) Take 1,000 mg by mouth daily at  12 noon.     polyethylene glycol (MIRALAX MIX-IN PAX) 17 g packet Take 17 g by mouth daily. 14 each 0   prochlorperazine (COMPAZINE) 10 MG tablet Take 1 tablet (10 mg total) by mouth every 6 (six) hours as needed for nausea or vomiting. 30 tablet 0   rOPINIRole (REQUIP) 0.25 MG tablet Take 0.25 mg by mouth 2 (two) times daily.     rosuvastatin (CRESTOR) 20 MG tablet Take 20 mg by mouth in the morning.     senna (SENOKOT) 8.6 MG TABS tablet Take 1 tablet (8.6 mg total) by mouth 2 (two) times daily. 60 tablet 0   trazodone (DESYREL) 300 MG tablet Take 300 mg by mouth at bedtime.     valsartan-hydrochlorothiazide (DIOVAN-HCT) 160-12.5 MG per tablet Take 1 tablet by mouth in the morning.      VICTOZA 18 MG/3ML SOPN Inject 1.2 mg into the skin every evening.     vitamin E 400 UNIT capsule Take 400 Units by mouth daily at 12 noon.     No current facility-administered medications for this visit.     Allergies:  Allergies  Allergen Reactions   Oxycodone     Other reaction(s): vomiting   Promethazine-Codeine Other (See Comments)    Pt does not remember reaction   Physical exam  Blood pressure (!) 105/48, pulse 68, temperature 98.4 F (36.9 C), temperature source Tympanic, resp. rate 17, weight 218 lb 2 oz (98.9 kg), SpO2 95 %.      ECOG 1    General appearance: Comfortable appearing without any discomfort Head: Normocephalic without any trauma Oropharynx: Mucous membranes are moist and pink without any thrush or ulcers. Eyes: Pupils are equal and round reactive to light. Lymph nodes: No cervical, supraclavicular, inguinal or axillary lymphadenopathy.   Heart:regular rate and rhythm.  S1 and S2 without leg edema. Lung: Clear without any rhonchi or wheezes.  No dullness to percussion. Abdomin: Soft, nontender, nondistended with good bowel sounds.  No hepatosplenomegaly. Musculoskeletal: No joint deformity or effusion.  Full range of motion noted. Neurological: No deficits noted on motor, sensory and deep tendon reflex exam. Skin: No petechial rash or dryness.  Appeared moist.       Lab Results: Lab Results  Component Value Date   WBC 7.7 12/10/2021   HGB 8.3 (L) 12/10/2021   HCT 26.5 (L) 12/10/2021   MCV 82.0 12/10/2021   PLT 278 12/10/2021     Chemistry      Component Value Date/Time   NA 134 (L) 12/10/2021 1059   K 4.0 12/10/2021 1059   CL 101 12/10/2021 1059   CO2 25 12/10/2021 1059   BUN 18 12/10/2021 1059   CREATININE 1.35 (H) 12/10/2021 1059      Component Value Date/Time   CALCIUM 9.0 12/10/2021 1059   ALKPHOS 78 12/10/2021 1059   AST 12 (L) 12/10/2021 1059   ALT 9 12/10/2021 1059   BILITOT 0.4 12/10/2021 1059      IMPRESSION: 1.  Clear interval progression of the soft tissue mass involving the anterior left hip. Otherwise, metastatic disease in the chest (compared to 07/29/2021) and abdomen/pelvis (compared to 10/06/2021) shows no substantial interval change. 2. No new sites of metastatic involvement on today's exam. 3. Aortic Atherosclerosis (ICD10-I70.0).     Impression and Plan:  16 year old with:  1.  Kidney cancer diagnosed in January 2023.  He was found to have stage IV intermediate risk clear-cell with bone, pulmonary disease in addition to lymphadenopathy.   He  continues to be on a single agent Pembrolizumab is the preferred option for treatment based on his health issues and preference.  CT scan obtained on 12/25/2021 was personally reviewed and showed overall stable disease except for progression of the soft tissue involving the left hip.  Treatment options moving forward were discussed including adding oral targeted therapy such as Inlyta or Lenvima versus continuing single agent Pembrolizumab.  After discussion today, we opted to continue with single agent Pembrolizumab.  We will repeat CT scan in 3 months.   2.  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  Consideration for radiation therapy would be made if he develops worsening pain.  Pain is manageable currently.   3.  Bone pain: Predominantly related to his cancer and surgery.  Hydrocodone is managing his pain.   4.  Immune mediated complications: I continue to educate him about these complications including pneumonitis, colitis and thyroid disease.  5.  Anemia: Related to malignancy and surgery.  We will continue to monitor and transfuse as needed.  6.  Hypotension: His blood pressure is better after decreasing blood pressure medication.   7.  Follow-up: He will return in 3 weeks for the next cycle of therapy.   30  minutes were spent on this  encounter.  The time was dedicated to reviewing laboratory data, disease status update and outlining future plan of care discussion.   Zola Button, MD 12/30/2021 9:53 AM

## 2021-12-31 ENCOUNTER — Other Ambulatory Visit: Payer: Self-pay | Admitting: *Deleted

## 2021-12-31 MED ORDER — MAGIC MOUTHWASH W/LIDOCAINE: 5.0000 mL | Freq: Four times a day (QID) | ORAL | 0 refills | Status: AC

## 2022-01-04 ENCOUNTER — Other Ambulatory Visit (HOSPITAL_BASED_OUTPATIENT_CLINIC_OR_DEPARTMENT_OTHER): Payer: Self-pay

## 2022-01-04 ENCOUNTER — Other Ambulatory Visit: Payer: Self-pay | Admitting: Oncology

## 2022-01-04 ENCOUNTER — Other Ambulatory Visit: Payer: Self-pay

## 2022-01-04 DIAGNOSIS — C649 Malignant neoplasm of unspecified kidney, except renal pelvis: Secondary | ICD-10-CM

## 2022-01-04 MED ORDER — HYDROCODONE-ACETAMINOPHEN 10-325 MG PO TABS
1.0000 | ORAL_TABLET | Freq: Four times a day (QID) | ORAL | 0 refills | Status: DC | PRN
  Filled 2022-01-04 – 2022-01-05 (×2): qty 60, 15d supply, fill #0

## 2022-01-05 ENCOUNTER — Other Ambulatory Visit (HOSPITAL_BASED_OUTPATIENT_CLINIC_OR_DEPARTMENT_OTHER): Payer: Self-pay

## 2022-01-05 ENCOUNTER — Other Ambulatory Visit: Payer: Self-pay | Admitting: Oncology

## 2022-01-05 ENCOUNTER — Encounter: Payer: Medicare Other | Admitting: Physician Assistant

## 2022-01-05 DIAGNOSIS — C649 Malignant neoplasm of unspecified kidney, except renal pelvis: Secondary | ICD-10-CM

## 2022-01-05 MED ORDER — HYDROCODONE-ACETAMINOPHEN 10-325 MG PO TABS: 1.0000 | ORAL_TABLET | Freq: Four times a day (QID) | ORAL | 0 refills | Status: DC | PRN

## 2022-01-06 ENCOUNTER — Other Ambulatory Visit (HOSPITAL_BASED_OUTPATIENT_CLINIC_OR_DEPARTMENT_OTHER): Payer: Self-pay

## 2022-01-06 ENCOUNTER — Telehealth: Payer: Self-pay

## 2022-01-06 ENCOUNTER — Inpatient Hospital Stay: Payer: Medicare Other | Admitting: Physician Assistant

## 2022-01-06 NOTE — Telephone Encounter (Signed)
RN called pt regarding 1:30 appointment today. Per patient, he had a time conflict and was unable to confirm what time to come in today. Appointment rescheduled for tomorrow at 12p, patient aware of date and time.

## 2022-01-07 ENCOUNTER — Other Ambulatory Visit: Payer: Self-pay | Admitting: *Deleted

## 2022-01-07 ENCOUNTER — Telehealth: Payer: Self-pay | Admitting: *Deleted

## 2022-01-07 ENCOUNTER — Inpatient Hospital Stay: Payer: Medicare Other | Admitting: Physician Assistant

## 2022-01-07 DIAGNOSIS — C649 Malignant neoplasm of unspecified kidney, except renal pelvis: Secondary | ICD-10-CM

## 2022-01-07 MED ORDER — HYDROCODONE-ACETAMINOPHEN 10-325 MG PO TABS: 1.0000 | ORAL_TABLET | Freq: Four times a day (QID) | ORAL | 0 refills | Status: DC | PRN

## 2022-01-07 NOTE — Telephone Encounter (Signed)
Returned PC to patient, he reports a "major pain event" in his L leg which started yesterday & continues today, "feels like a vibration, very severe."  Patient states he is completely out of pain medication & is unable to come to his [facility] appointment today.  Rx refill for his norco sent to pharmacy, patient informed.  Advised patient to contact his orthopedic surgeon to make them aware of his increased pain, patient verbalizes understanding.

## 2022-01-11 ENCOUNTER — Telehealth: Payer: Self-pay | Admitting: *Deleted

## 2022-01-11 ENCOUNTER — Other Ambulatory Visit: Payer: Self-pay | Admitting: *Deleted

## 2022-01-11 DIAGNOSIS — M899 Disorder of bone, unspecified: Secondary | ICD-10-CM

## 2022-01-11 DIAGNOSIS — C649 Malignant neoplasm of unspecified kidney, except renal pelvis: Secondary | ICD-10-CM

## 2022-01-11 NOTE — Telephone Encounter (Signed)
Referral called to [facility]. Notes and referral faxed

## 2022-01-12 DIAGNOSIS — I959 Hypotension, unspecified: Secondary | ICD-10-CM | POA: Diagnosis not present

## 2022-01-12 DIAGNOSIS — Z79891 Long term (current) use of opiate analgesic: Secondary | ICD-10-CM | POA: Diagnosis not present

## 2022-01-12 DIAGNOSIS — G893 Neoplasm related pain (acute) (chronic): Secondary | ICD-10-CM | POA: Diagnosis not present

## 2022-01-12 DIAGNOSIS — Z9181 History of falling: Secondary | ICD-10-CM | POA: Diagnosis not present

## 2022-01-12 DIAGNOSIS — L89322 Pressure ulcer of left buttock, stage 2: Secondary | ICD-10-CM | POA: Diagnosis not present

## 2022-01-12 DIAGNOSIS — C7951 Secondary malignant neoplasm of bone: Secondary | ICD-10-CM | POA: Diagnosis not present

## 2022-01-12 DIAGNOSIS — E119 Type 2 diabetes mellitus without complications: Secondary | ICD-10-CM | POA: Diagnosis not present

## 2022-01-12 DIAGNOSIS — L89312 Pressure ulcer of right buttock, stage 2: Secondary | ICD-10-CM | POA: Diagnosis not present

## 2022-01-12 DIAGNOSIS — C779 Secondary and unspecified malignant neoplasm of lymph node, unspecified: Secondary | ICD-10-CM | POA: Diagnosis not present

## 2022-01-12 DIAGNOSIS — M84552D Pathological fracture in neoplastic disease, left femur, subsequent encounter for fracture with routine healing: Secondary | ICD-10-CM | POA: Diagnosis not present

## 2022-01-12 DIAGNOSIS — C7800 Secondary malignant neoplasm of unspecified lung: Secondary | ICD-10-CM | POA: Diagnosis not present

## 2022-01-12 DIAGNOSIS — C641 Malignant neoplasm of right kidney, except renal pelvis: Secondary | ICD-10-CM | POA: Diagnosis not present

## 2022-01-12 DIAGNOSIS — D630 Anemia in neoplastic disease: Secondary | ICD-10-CM | POA: Diagnosis not present

## 2022-01-12 DIAGNOSIS — I700 Atherosclerosis of aorta: Secondary | ICD-10-CM | POA: Diagnosis not present

## 2022-01-15 DIAGNOSIS — M84552D Pathological fracture in neoplastic disease, left femur, subsequent encounter for fracture with routine healing: Secondary | ICD-10-CM | POA: Diagnosis not present

## 2022-01-15 DIAGNOSIS — G893 Neoplasm related pain (acute) (chronic): Secondary | ICD-10-CM | POA: Diagnosis not present

## 2022-01-15 DIAGNOSIS — C7800 Secondary malignant neoplasm of unspecified lung: Secondary | ICD-10-CM | POA: Diagnosis not present

## 2022-01-15 DIAGNOSIS — C7951 Secondary malignant neoplasm of bone: Secondary | ICD-10-CM | POA: Diagnosis not present

## 2022-01-15 DIAGNOSIS — C779 Secondary and unspecified malignant neoplasm of lymph node, unspecified: Secondary | ICD-10-CM | POA: Diagnosis not present

## 2022-01-15 DIAGNOSIS — C641 Malignant neoplasm of right kidney, except renal pelvis: Secondary | ICD-10-CM | POA: Diagnosis not present

## 2022-01-18 ENCOUNTER — Other Ambulatory Visit: Payer: Self-pay | Admitting: Oncology

## 2022-01-18 DIAGNOSIS — C649 Malignant neoplasm of unspecified kidney, except renal pelvis: Secondary | ICD-10-CM

## 2022-01-18 MED ORDER — HYDROCODONE-ACETAMINOPHEN 10-325 MG PO TABS: 1.0000 | ORAL_TABLET | Freq: Four times a day (QID) | ORAL | 0 refills | Status: DC | PRN

## 2022-01-20 ENCOUNTER — Ambulatory Visit: Payer: Medicare Other

## 2022-01-20 ENCOUNTER — Ambulatory Visit: Payer: Medicare Other | Admitting: Oncology

## 2022-01-20 ENCOUNTER — Other Ambulatory Visit: Payer: Medicare Other

## 2022-01-20 DIAGNOSIS — G893 Neoplasm related pain (acute) (chronic): Secondary | ICD-10-CM | POA: Diagnosis not present

## 2022-01-20 DIAGNOSIS — M84552D Pathological fracture in neoplastic disease, left femur, subsequent encounter for fracture with routine healing: Secondary | ICD-10-CM | POA: Diagnosis not present

## 2022-01-20 DIAGNOSIS — C779 Secondary and unspecified malignant neoplasm of lymph node, unspecified: Secondary | ICD-10-CM | POA: Diagnosis not present

## 2022-01-20 DIAGNOSIS — C641 Malignant neoplasm of right kidney, except renal pelvis: Secondary | ICD-10-CM | POA: Diagnosis not present

## 2022-01-20 DIAGNOSIS — C7951 Secondary malignant neoplasm of bone: Secondary | ICD-10-CM | POA: Diagnosis not present

## 2022-01-20 DIAGNOSIS — C7800 Secondary malignant neoplasm of unspecified lung: Secondary | ICD-10-CM | POA: Diagnosis not present

## 2022-01-22 DIAGNOSIS — C7800 Secondary malignant neoplasm of unspecified lung: Secondary | ICD-10-CM | POA: Diagnosis not present

## 2022-01-22 DIAGNOSIS — C7951 Secondary malignant neoplasm of bone: Secondary | ICD-10-CM | POA: Diagnosis not present

## 2022-01-22 DIAGNOSIS — C779 Secondary and unspecified malignant neoplasm of lymph node, unspecified: Secondary | ICD-10-CM | POA: Diagnosis not present

## 2022-01-22 DIAGNOSIS — G893 Neoplasm related pain (acute) (chronic): Secondary | ICD-10-CM | POA: Diagnosis not present

## 2022-01-22 DIAGNOSIS — C641 Malignant neoplasm of right kidney, except renal pelvis: Secondary | ICD-10-CM | POA: Diagnosis not present

## 2022-01-22 DIAGNOSIS — M84552D Pathological fracture in neoplastic disease, left femur, subsequent encounter for fracture with routine healing: Secondary | ICD-10-CM | POA: Diagnosis not present

## 2022-01-26 ENCOUNTER — Inpatient Hospital Stay: Payer: Medicare Other | Attending: Physician Assistant

## 2022-01-26 ENCOUNTER — Other Ambulatory Visit (HOSPITAL_BASED_OUTPATIENT_CLINIC_OR_DEPARTMENT_OTHER): Payer: Self-pay

## 2022-01-26 ENCOUNTER — Inpatient Hospital Stay: Payer: Medicare Other

## 2022-01-26 ENCOUNTER — Other Ambulatory Visit: Payer: Self-pay | Admitting: *Deleted

## 2022-01-26 ENCOUNTER — Inpatient Hospital Stay (HOSPITAL_BASED_OUTPATIENT_CLINIC_OR_DEPARTMENT_OTHER): Payer: Medicare Other | Admitting: Oncology

## 2022-01-26 ENCOUNTER — Telehealth: Payer: Self-pay | Admitting: *Deleted

## 2022-01-26 ENCOUNTER — Other Ambulatory Visit: Payer: Self-pay

## 2022-01-26 ENCOUNTER — Encounter: Payer: Self-pay | Admitting: Oncology

## 2022-01-26 VITALS — BP 134/54 | HR 72 | Temp 97.8°F | Resp 17 | Ht 66.0 in | Wt 219.3 lb

## 2022-01-26 DIAGNOSIS — C649 Malignant neoplasm of unspecified kidney, except renal pelvis: Secondary | ICD-10-CM

## 2022-01-26 DIAGNOSIS — C641 Malignant neoplasm of right kidney, except renal pelvis: Secondary | ICD-10-CM | POA: Diagnosis not present

## 2022-01-26 DIAGNOSIS — D509 Iron deficiency anemia, unspecified: Secondary | ICD-10-CM | POA: Insufficient documentation

## 2022-01-26 DIAGNOSIS — E032 Hypothyroidism due to medicaments and other exogenous substances: Secondary | ICD-10-CM

## 2022-01-26 DIAGNOSIS — C7800 Secondary malignant neoplasm of unspecified lung: Secondary | ICD-10-CM | POA: Insufficient documentation

## 2022-01-26 DIAGNOSIS — Z5112 Encounter for antineoplastic immunotherapy: Secondary | ICD-10-CM | POA: Insufficient documentation

## 2022-01-26 DIAGNOSIS — Z905 Acquired absence of kidney: Secondary | ICD-10-CM | POA: Diagnosis not present

## 2022-01-26 DIAGNOSIS — Z7985 Long-term (current) use of injectable non-insulin antidiabetic drugs: Secondary | ICD-10-CM | POA: Insufficient documentation

## 2022-01-26 DIAGNOSIS — C7951 Secondary malignant neoplasm of bone: Secondary | ICD-10-CM | POA: Diagnosis not present

## 2022-01-26 DIAGNOSIS — Z79899 Other long term (current) drug therapy: Secondary | ICD-10-CM | POA: Diagnosis not present

## 2022-01-26 LAB — IRON AND IRON BINDING CAPACITY (CC-WL,HP ONLY)
Iron: 15 ug/dL — ABNORMAL LOW (ref 45–182)
Saturation Ratios: 5 % — ABNORMAL LOW (ref 17.9–39.5)
TIBC: 308 ug/dL (ref 250–450)
UIBC: 293 ug/dL (ref 117–376)

## 2022-01-26 LAB — CBC WITH DIFFERENTIAL (CANCER CENTER ONLY)
Abs Immature Granulocytes: 0.02 10*3/uL (ref 0.00–0.07)
Basophils Absolute: 0.1 10*3/uL (ref 0.0–0.1)
Basophils Relative: 1 %
Eosinophils Absolute: 0.2 10*3/uL (ref 0.0–0.5)
Eosinophils Relative: 3 %
HCT: 25.6 % — ABNORMAL LOW (ref 39.0–52.0)
Hemoglobin: 7.8 g/dL — ABNORMAL LOW (ref 13.0–17.0)
Immature Granulocytes: 0 %
Lymphocytes Relative: 14 %
Lymphs Abs: 1 10*3/uL (ref 0.7–4.0)
MCH: 23.2 pg — ABNORMAL LOW (ref 26.0–34.0)
MCHC: 30.5 g/dL (ref 30.0–36.0)
MCV: 76.2 fL — ABNORMAL LOW (ref 80.0–100.0)
Monocytes Absolute: 0.7 10*3/uL (ref 0.1–1.0)
Monocytes Relative: 10 %
Neutro Abs: 5 10*3/uL (ref 1.7–7.7)
Neutrophils Relative %: 72 %
Platelet Count: 301 10*3/uL (ref 150–400)
RBC: 3.36 MIL/uL — ABNORMAL LOW (ref 4.22–5.81)
RDW: 16.8 % — ABNORMAL HIGH (ref 11.5–15.5)
WBC Count: 6.9 10*3/uL (ref 4.0–10.5)
nRBC: 0 % (ref 0.0–0.2)

## 2022-01-26 LAB — CMP (CANCER CENTER ONLY)
ALT: 10 U/L (ref 0–44)
AST: 12 U/L — ABNORMAL LOW (ref 15–41)
Albumin: 3.4 g/dL — ABNORMAL LOW (ref 3.5–5.0)
Alkaline Phosphatase: 71 U/L (ref 38–126)
Anion gap: 6 (ref 5–15)
BUN: 10 mg/dL (ref 8–23)
CO2: 28 mmol/L (ref 22–32)
Calcium: 9 mg/dL (ref 8.9–10.3)
Chloride: 102 mmol/L (ref 98–111)
Creatinine: 1.08 mg/dL (ref 0.61–1.24)
GFR, Estimated: >60 mL/min (ref >60)
Glucose, Bld: 128 mg/dL — ABNORMAL HIGH (ref 70–99)
Potassium: 3.8 mmol/L (ref 3.5–5.1)
Sodium: 136 mmol/L (ref 135–145)
Total Bilirubin: 0.3 mg/dL (ref 0.3–1.2)
Total Protein: 7.2 g/dL (ref 6.5–8.1)

## 2022-01-26 LAB — TSH: TSH: 0.769 u[IU]/mL (ref 0.350–4.500)

## 2022-01-26 LAB — FERRITIN: Ferritin: 11 ng/mL — ABNORMAL LOW (ref 24–336)

## 2022-01-26 MED ORDER — SODIUM CHLORIDE 0.9 % IV SOLN: Freq: Once | INTRAVENOUS | Status: AC

## 2022-01-26 MED ORDER — FERROUS SULFATE 325 (65 FE) MG PO TABS: 325.0000 mg | ORAL_TABLET | Freq: Two times a day (BID) | ORAL | 3 refills | Status: AC

## 2022-01-26 MED ORDER — SODIUM CHLORIDE 0.9 % IV SOLN
200.0000 mg | Freq: Once | INTRAVENOUS | Status: AC
  Administered 2022-01-26: 200 mg via INTRAVENOUS
  Filled 2022-01-26: qty 200

## 2022-01-26 MED ORDER — FERROUS SULFATE 325 (65 FE) MG PO TABS
325.0000 mg | ORAL_TABLET | Freq: Two times a day (BID) | ORAL | 3 refills | Status: DC
  Filled 2022-01-26: qty 100, 50d supply, fill #0

## 2022-01-26 NOTE — Progress Notes (Signed)
Per Dr. Alen Blew, ok to treat with hgb 7.8

## 2022-01-26 NOTE — Patient Instructions (Signed)
[facility]  Discharge Instructions: ?Thank you for choosing [facility] to provide your oncology and hematology care.  ? ?If you have a lab appointment with the Cancer Center, please go directly to the Cancer Center and check in at the registration area. ?  ?Wear comfortable clothing and clothing appropriate for easy access to any Portacath or PICC line.  ? ?We strive to give you quality time with your provider. You may need to reschedule your appointment if you arrive late (15 or more minutes).  Arriving late affects you and other patients whose appointments are after yours.  Also, if you miss three or more appointments without notifying the office, you may be dismissed from the clinic at the provider?s discretion.    ?  ?For prescription refill requests, have your pharmacy contact our office and allow 72 hours for refills to be completed.   ? ?Today you received the following chemotherapy and/or immunotherapy agents: Keytruda ?  ?To help prevent nausea and vomiting after your treatment, we encourage you to take your nausea medication as directed. ? ?BELOW ARE SYMPTOMS THAT SHOULD BE REPORTED IMMEDIATELY: ?*FEVER GREATER THAN 100.4 F (38 ?C) OR HIGHER ?*CHILLS OR SWEATING ?*NAUSEA AND VOMITING THAT IS NOT CONTROLLED WITH YOUR NAUSEA MEDICATION ?*UNUSUAL SHORTNESS OF BREATH ?*UNUSUAL BRUISING OR BLEEDING ?*URINARY PROBLEMS (pain or burning when urinating, or frequent urination) ?*BOWEL PROBLEMS (unusual diarrhea, constipation, pain near the anus) ?TENDERNESS IN MOUTH AND THROAT WITH OR WITHOUT PRESENCE OF ULCERS (sore throat, sores in mouth, or a toothache) ?UNUSUAL RASH, SWELLING OR PAIN  ?UNUSUAL VAGINAL DISCHARGE OR ITCHING  ? ?Items with * indicate a potential emergency and should be followed up as soon as possible or go to the Emergency Department if any problems should occur. ? ?Please show the CHEMOTHERAPY ALERT CARD or IMMUNOTHERAPY ALERT CARD at check-in to the  Emergency Department and triage nurse. ? ?Should you have questions after your visit or need to cancel or reschedule your appointment, please contact [facility]  Dept: [phone]  and follow the prompts.  Office hours are 8:00 a.m. to 4:30 p.m. Monday - Friday. Please note that voicemails left after 4:00 p.m. may not be returned until the following business day.  We are closed weekends and major holidays. You have access to a nurse at all times for urgent questions. Please call the main number to the clinic Dept: [phone] and follow the prompts. ? ? ?For any non-urgent questions, you may also contact your provider using MyChart. We now offer e-Visits for anyone 18 and older to request care online for non-urgent symptoms. For details visit [email or web address]. ?  ?Also download the MyChart app! Go to the app store, search "MyChart", open the app, select [place], and log in with your MyChart username and password. ? ?Due to Covid, a mask is required upon entering the hospital/clinic. If you do not have a mask, one will be given to you upon arrival. For doctor visits, patients may have 1 support person aged 18 or older with them. For treatment visits, patients cannot have anyone with them due to current Covid guidelines and our immunocompromised population.  ? ?

## 2022-01-26 NOTE — Addendum Note (Signed)
Addended by: Wyatt Portela on: 01/26/2022 12:37 PM   Modules accepted: Orders

## 2022-01-26 NOTE — Telephone Encounter (Signed)
Notified of iron results. Prescription sent by Dr Alen Blew

## 2022-01-26 NOTE — Progress Notes (Signed)
Hematology and Oncology Follow Michael   Thomas Michael [id number] 07/03/1941 81 y.o. 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     Prior Therapy:   He is status post right radical nephrectomy in May 2012.  He was found to have stage Ib tumor.  He is status post open treatment of impending pathological fracture of the left proximal femur with intramedullary implant completed on October 01, 2021.   Current therapy: Pembrolizumab 200 mg every 3 weeks started on September 08, 2021.  He is here for cycle 7 of therapy.   Interim History: Thomas Michael returns today for repeat follow-Michael.  Since last visit, he reports no major changes in his health.  He continues to tolerate current treatment without any concerns.  He denies any nausea, vomiting or abdominal pain.  He denies any hospitalizations or illnesses.  His pain is manageable at this time currently using hydrocodone short acting medication.  He denies skin rashes or lesions.  He denies any respiratory issues.     Medications: Updated are reviewed. Current Outpatient Medications  Medication Sig Dispense Refill   Accu-Chek FastClix Lancets MISC check CBG     acetaminophen (TYLENOL) 500 MG tablet Take 1,000 mg by mouth every 8 (eight) hours as needed for moderate pain.     axitinib (INLYTA) 5 MG tablet Take 1 tablet (5 mg total) by mouth daily. 30 tablet 1   Blood Glucose Monitoring Suppl (ACCU-CHEK AVIVA PLUS) w/Device KIT check CBG     chlorpheniramine-HYDROcodone 10-8 MG/5ML Take 5 mLs by mouth at bedtime as needed for cough. 473 mL 0   Cholecalciferol 50 MCG (2000 UT) TABS Take 2,000 Units by mouth daily at 12 noon.     ciclopirox (PENLAC) 8 % solution Apply 1 application topically daily as needed (fungus (toes)). Apply over nail and surrounding skin.  Apply daily as needed over previous coat.     clopidogrel (PLAVIX) 75 MG tablet Take 75 mg by mouth at bedtime.     diphenhydrAMINE (BENADRYL) 25 MG tablet Take 50 mg by mouth at bedtime.     docusate sodium (COLACE) 100 MG capsule Take 200 mg by mouth daily as needed for mild constipation.     guaiFENesin (MUCINEX) 600 MG 12 hr tablet Take 1,200 mg by mouth 2 (two) times daily as needed for to loosen phlegm or cough.     HYDROcodone-acetaminophen (NORCO) 10-325 MG tablet Take 1 tablet by mouth every 6 (six) hours as needed. 60 tablet 0   levothyroxine (SYNTHROID, LEVOTHROID) 200 MCG tablet Take 200 mcg by mouth every evening.     magic mouthwash w/lidocaine SOLN Take 5 mLs by mouth 4 (four) times daily. 5 ml QID PRN mouth sores, swish and spit 240 mL 0   metoprolol (TOPROL-XL) 50 MG 24 hr tablet Take 50 mg by mouth in the morning.     Multiple Vitamin (MULTIVITAMIN WITH MINERALS) TABS tablet Take 1 tablet by mouth daily at 12 noon.     niacin 500 MG CR capsule Take 500 mg by mouth daily at 12 noon.     nystatin-triamcinolone ointment (MYCOLOG) Apply 1 application. topically 2 (two) times daily. 30 g 3   Omega-3 Fatty Acids (FISH OIL PO) Take 1,000 mg by mouth daily at 12 noon.     polyethylene glycol (MIRALAX MIX-IN   PAX) 17 g packet Take 17 g by mouth daily. 14 each 0   prochlorperazine (COMPAZINE) 10 MG tablet Take 1 tablet (10 mg total) by mouth every 6 (six) hours as needed for nausea or vomiting. 30 tablet 0   rOPINIRole (REQUIP) 0.25 MG tablet Take 0.25 mg by mouth 2 (two) times daily.     rosuvastatin (CRESTOR) 20 MG tablet Take 20 mg by mouth in the morning.     senna (SENOKOT) 8.6 MG TABS tablet Take 1 tablet (8.6 mg total) by mouth 2 (two) times daily. 60 tablet 0   trazodone (DESYREL) 300 MG tablet Take 300 mg by mouth at bedtime.     valsartan-hydrochlorothiazide (DIOVAN-HCT) 160-12.5 MG per tablet Take 1 tablet by mouth in the morning.     VICTOZA 18 MG/3ML SOPN Inject 1.2 mg into  the skin every evening.     vitamin E 400 UNIT capsule Take 400 Units by mouth daily at 12 noon.     No current facility-administered medications for this visit.   Facility-Administered Medications Ordered in Other Visits  Medication Dose Route Frequency Provider Last Rate Last Admin   pembrolizumab (KEYTRUDA) 200 mg in sodium chloride 0.9 % 50 mL chemo infusion  200 mg Intravenous Once Wyatt Portela, MD         Allergies:  Allergies  Allergen Reactions   Oxycodone     Other reaction(s): vomiting   Promethazine-Codeine Other (See Comments)    Pt does not remember reaction   Physical exam  Blood pressure (!) 134/54, pulse 72, temperature 97.8 F (36.6 C), temperature source Temporal, resp. rate 17, height 5' 6" (1.676 m), weight 219 lb 4.8 oz (99.5 kg), SpO2 98 %.      ECOG 1   General appearance: Alert, awake without any distress. Head: Atraumatic without abnormalities Oropharynx: Without any thrush or ulcers. Eyes: No scleral icterus. Lymph nodes: No lymphadenopathy noted in the cervical, supraclavicular, or axillary nodes Heart:regular rate and rhythm, without any murmurs or gallops.   Lung: Clear to auscultation without any rhonchi, wheezes or dullness to percussion. Abdomin: Soft, nontender without any shifting dullness or ascites. Musculoskeletal: No clubbing or cyanosis. Neurological: No motor or sensory deficits. Skin: No rashes or lesions.       Lab Results: Lab Results  Component Value Date   WBC 6.9 01/26/2022   HGB 7.8 (L) 01/26/2022   HCT 25.6 (L) 01/26/2022   MCV 76.2 (L) 01/26/2022   PLT 301 01/26/2022     Chemistry      Component Value Date/Time   NA 136 01/26/2022 1119   K 3.8 01/26/2022 1119   CL 102 01/26/2022 1119   CO2 28 01/26/2022 1119   BUN 10 01/26/2022 1119   CREATININE 1.08 01/26/2022 1119      Component Value Date/Time   CALCIUM 9.0 01/26/2022 1119   ALKPHOS 71 01/26/2022 1119   AST 12 (L) 01/26/2022 1119   ALT 10  01/26/2022 1119   BILITOT 0.3 01/26/2022 1119      I     Impression and Plan:  81 year old with:  1.  Stage IV intermediate risk clear-cell renal cell carcinoma with bone, pulmonary disease in addition to lymphadenopathy diagnosed in 2023.   The natural course of this disease was reviewed at this time and treatment choices were discussed.  He continues to be on Pembrolizumab without any major complications and overall stable disease.  Risks and benefits of continuing this treatment were discussed and he is agreeable  to proceed.  We will update his staging scans in the next 3 months.   2.  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  Radiation therapy could be considered if increased pain is noted.   3.  Bone pain: He is currently on hydrocodone with pain is manageable.  Discussed the role for long-acting pain medication and this has been deferred for the time being.   4.  Immune mediated complications: He does not have any problems with colitis, pneumonitis or hepatitis.  His TSH is mildly elevated and we will continue to monitor.  5.  Anemia: Related to iron deficiency I recommended iron supplementation at this time.  No need for transfusion currently.  6.  Hypotension: His blood pressure is close to normal range at this time and his hypotension has resolved.   7.  Follow-Michael: 3 months for repeat evaluation.   30  minutes were dedicated to this encounter.  The time was spent on reviewing laboratory data, disease status update and outlining future plan of care discussion.   Zola Button, MD 01/26/2022 12:21 PM

## 2022-01-27 DIAGNOSIS — C641 Malignant neoplasm of right kidney, except renal pelvis: Secondary | ICD-10-CM | POA: Diagnosis not present

## 2022-01-27 DIAGNOSIS — M84552D Pathological fracture in neoplastic disease, left femur, subsequent encounter for fracture with routine healing: Secondary | ICD-10-CM | POA: Diagnosis not present

## 2022-01-27 DIAGNOSIS — C7800 Secondary malignant neoplasm of unspecified lung: Secondary | ICD-10-CM | POA: Diagnosis not present

## 2022-01-27 DIAGNOSIS — C7951 Secondary malignant neoplasm of bone: Secondary | ICD-10-CM | POA: Diagnosis not present

## 2022-01-27 DIAGNOSIS — C779 Secondary and unspecified malignant neoplasm of lymph node, unspecified: Secondary | ICD-10-CM | POA: Diagnosis not present

## 2022-01-27 DIAGNOSIS — G893 Neoplasm related pain (acute) (chronic): Secondary | ICD-10-CM | POA: Diagnosis not present

## 2022-02-01 DIAGNOSIS — C7800 Secondary malignant neoplasm of unspecified lung: Secondary | ICD-10-CM | POA: Diagnosis not present

## 2022-02-01 DIAGNOSIS — C641 Malignant neoplasm of right kidney, except renal pelvis: Secondary | ICD-10-CM | POA: Diagnosis not present

## 2022-02-01 DIAGNOSIS — G893 Neoplasm related pain (acute) (chronic): Secondary | ICD-10-CM | POA: Diagnosis not present

## 2022-02-01 DIAGNOSIS — C779 Secondary and unspecified malignant neoplasm of lymph node, unspecified: Secondary | ICD-10-CM | POA: Diagnosis not present

## 2022-02-01 DIAGNOSIS — C7951 Secondary malignant neoplasm of bone: Secondary | ICD-10-CM | POA: Diagnosis not present

## 2022-02-01 DIAGNOSIS — M84552D Pathological fracture in neoplastic disease, left femur, subsequent encounter for fracture with routine healing: Secondary | ICD-10-CM | POA: Diagnosis not present

## 2022-02-02 ENCOUNTER — Other Ambulatory Visit: Payer: Self-pay | Admitting: Oncology

## 2022-02-02 DIAGNOSIS — C649 Malignant neoplasm of unspecified kidney, except renal pelvis: Secondary | ICD-10-CM

## 2022-02-02 MED ORDER — HYDROCODONE-ACETAMINOPHEN 10-325 MG PO TABS: 1.0000 | ORAL_TABLET | Freq: Four times a day (QID) | ORAL | 0 refills | Status: DC | PRN

## 2022-02-08 ENCOUNTER — Other Ambulatory Visit: Payer: Self-pay

## 2022-02-09 DIAGNOSIS — M84552D Pathological fracture in neoplastic disease, left femur, subsequent encounter for fracture with routine healing: Secondary | ICD-10-CM | POA: Diagnosis not present

## 2022-02-09 DIAGNOSIS — C641 Malignant neoplasm of right kidney, except renal pelvis: Secondary | ICD-10-CM | POA: Diagnosis not present

## 2022-02-09 DIAGNOSIS — G893 Neoplasm related pain (acute) (chronic): Secondary | ICD-10-CM | POA: Diagnosis not present

## 2022-02-09 DIAGNOSIS — C779 Secondary and unspecified malignant neoplasm of lymph node, unspecified: Secondary | ICD-10-CM | POA: Diagnosis not present

## 2022-02-09 DIAGNOSIS — C7951 Secondary malignant neoplasm of bone: Secondary | ICD-10-CM | POA: Diagnosis not present

## 2022-02-09 DIAGNOSIS — C7800 Secondary malignant neoplasm of unspecified lung: Secondary | ICD-10-CM | POA: Diagnosis not present

## 2022-02-10 ENCOUNTER — Inpatient Hospital Stay: Payer: Medicare Other

## 2022-02-10 ENCOUNTER — Inpatient Hospital Stay: Payer: Medicare Other | Admitting: Physician Assistant

## 2022-02-10 ENCOUNTER — Other Ambulatory Visit: Payer: Self-pay

## 2022-02-11 DIAGNOSIS — Z9181 History of falling: Secondary | ICD-10-CM | POA: Diagnosis not present

## 2022-02-11 DIAGNOSIS — G893 Neoplasm related pain (acute) (chronic): Secondary | ICD-10-CM | POA: Diagnosis not present

## 2022-02-11 DIAGNOSIS — L89322 Pressure ulcer of left buttock, stage 2: Secondary | ICD-10-CM | POA: Diagnosis not present

## 2022-02-11 DIAGNOSIS — I700 Atherosclerosis of aorta: Secondary | ICD-10-CM | POA: Diagnosis not present

## 2022-02-11 DIAGNOSIS — C641 Malignant neoplasm of right kidney, except renal pelvis: Secondary | ICD-10-CM | POA: Diagnosis not present

## 2022-02-11 DIAGNOSIS — C7800 Secondary malignant neoplasm of unspecified lung: Secondary | ICD-10-CM | POA: Diagnosis not present

## 2022-02-11 DIAGNOSIS — L89312 Pressure ulcer of right buttock, stage 2: Secondary | ICD-10-CM | POA: Diagnosis not present

## 2022-02-11 DIAGNOSIS — M84552D Pathological fracture in neoplastic disease, left femur, subsequent encounter for fracture with routine healing: Secondary | ICD-10-CM | POA: Diagnosis not present

## 2022-02-11 DIAGNOSIS — C7951 Secondary malignant neoplasm of bone: Secondary | ICD-10-CM | POA: Diagnosis not present

## 2022-02-11 DIAGNOSIS — D630 Anemia in neoplastic disease: Secondary | ICD-10-CM | POA: Diagnosis not present

## 2022-02-11 DIAGNOSIS — E119 Type 2 diabetes mellitus without complications: Secondary | ICD-10-CM | POA: Diagnosis not present

## 2022-02-11 DIAGNOSIS — I959 Hypotension, unspecified: Secondary | ICD-10-CM | POA: Diagnosis not present

## 2022-02-11 DIAGNOSIS — C779 Secondary and unspecified malignant neoplasm of lymph node, unspecified: Secondary | ICD-10-CM | POA: Diagnosis not present

## 2022-02-11 DIAGNOSIS — Z79891 Long term (current) use of opiate analgesic: Secondary | ICD-10-CM | POA: Diagnosis not present

## 2022-02-13 DIAGNOSIS — C7800 Secondary malignant neoplasm of unspecified lung: Secondary | ICD-10-CM | POA: Diagnosis not present

## 2022-02-13 DIAGNOSIS — C779 Secondary and unspecified malignant neoplasm of lymph node, unspecified: Secondary | ICD-10-CM | POA: Diagnosis not present

## 2022-02-13 DIAGNOSIS — G893 Neoplasm related pain (acute) (chronic): Secondary | ICD-10-CM | POA: Diagnosis not present

## 2022-02-13 DIAGNOSIS — M84552D Pathological fracture in neoplastic disease, left femur, subsequent encounter for fracture with routine healing: Secondary | ICD-10-CM | POA: Diagnosis not present

## 2022-02-13 DIAGNOSIS — C7951 Secondary malignant neoplasm of bone: Secondary | ICD-10-CM | POA: Diagnosis not present

## 2022-02-13 DIAGNOSIS — C641 Malignant neoplasm of right kidney, except renal pelvis: Secondary | ICD-10-CM | POA: Diagnosis not present

## 2022-02-14 ENCOUNTER — Other Ambulatory Visit: Payer: Self-pay

## 2022-02-15 ENCOUNTER — Other Ambulatory Visit: Payer: Self-pay

## 2022-02-17 ENCOUNTER — Ambulatory Visit: Payer: Medicare Other | Admitting: Oncology

## 2022-02-17 ENCOUNTER — Other Ambulatory Visit: Payer: Medicare Other

## 2022-02-17 ENCOUNTER — Ambulatory Visit: Payer: Medicare Other

## 2022-02-17 DIAGNOSIS — C641 Malignant neoplasm of right kidney, except renal pelvis: Secondary | ICD-10-CM | POA: Diagnosis not present

## 2022-02-17 DIAGNOSIS — C7800 Secondary malignant neoplasm of unspecified lung: Secondary | ICD-10-CM | POA: Diagnosis not present

## 2022-02-17 DIAGNOSIS — C7951 Secondary malignant neoplasm of bone: Secondary | ICD-10-CM | POA: Diagnosis not present

## 2022-02-17 DIAGNOSIS — G893 Neoplasm related pain (acute) (chronic): Secondary | ICD-10-CM | POA: Diagnosis not present

## 2022-02-17 DIAGNOSIS — M84552D Pathological fracture in neoplastic disease, left femur, subsequent encounter for fracture with routine healing: Secondary | ICD-10-CM | POA: Diagnosis not present

## 2022-02-17 DIAGNOSIS — C779 Secondary and unspecified malignant neoplasm of lymph node, unspecified: Secondary | ICD-10-CM | POA: Diagnosis not present

## 2022-02-18 ENCOUNTER — Inpatient Hospital Stay: Payer: Medicare Other

## 2022-02-18 ENCOUNTER — Inpatient Hospital Stay: Payer: Medicare Other | Attending: Physician Assistant

## 2022-02-18 ENCOUNTER — Other Ambulatory Visit: Payer: Self-pay

## 2022-02-18 ENCOUNTER — Inpatient Hospital Stay (HOSPITAL_BASED_OUTPATIENT_CLINIC_OR_DEPARTMENT_OTHER): Payer: Medicare Other | Admitting: Oncology

## 2022-02-18 VITALS — BP 142/64 | HR 71 | Temp 98.0°F | Resp 19 | Ht 66.0 in | Wt 218.8 lb

## 2022-02-18 DIAGNOSIS — Z5112 Encounter for antineoplastic immunotherapy: Secondary | ICD-10-CM | POA: Diagnosis not present

## 2022-02-18 DIAGNOSIS — Z85528 Personal history of other malignant neoplasm of kidney: Secondary | ICD-10-CM | POA: Insufficient documentation

## 2022-02-18 DIAGNOSIS — R7303 Prediabetes: Secondary | ICD-10-CM | POA: Diagnosis not present

## 2022-02-18 DIAGNOSIS — R634 Abnormal weight loss: Secondary | ICD-10-CM | POA: Diagnosis not present

## 2022-02-18 DIAGNOSIS — R600 Localized edema: Secondary | ICD-10-CM | POA: Insufficient documentation

## 2022-02-18 DIAGNOSIS — C7951 Secondary malignant neoplasm of bone: Secondary | ICD-10-CM | POA: Insufficient documentation

## 2022-02-18 DIAGNOSIS — Z905 Acquired absence of kidney: Secondary | ICD-10-CM | POA: Diagnosis not present

## 2022-02-18 DIAGNOSIS — L299 Pruritus, unspecified: Secondary | ICD-10-CM | POA: Diagnosis not present

## 2022-02-18 DIAGNOSIS — G893 Neoplasm related pain (acute) (chronic): Secondary | ICD-10-CM | POA: Diagnosis not present

## 2022-02-18 DIAGNOSIS — C7800 Secondary malignant neoplasm of unspecified lung: Secondary | ICD-10-CM | POA: Insufficient documentation

## 2022-02-18 DIAGNOSIS — C649 Malignant neoplasm of unspecified kidney, except renal pelvis: Secondary | ICD-10-CM

## 2022-02-18 DIAGNOSIS — C775 Secondary and unspecified malignant neoplasm of intrapelvic lymph nodes: Secondary | ICD-10-CM | POA: Diagnosis not present

## 2022-02-18 DIAGNOSIS — Z79891 Long term (current) use of opiate analgesic: Secondary | ICD-10-CM | POA: Diagnosis not present

## 2022-02-18 DIAGNOSIS — K5900 Constipation, unspecified: Secondary | ICD-10-CM | POA: Insufficient documentation

## 2022-02-18 DIAGNOSIS — D509 Iron deficiency anemia, unspecified: Secondary | ICD-10-CM | POA: Diagnosis not present

## 2022-02-18 DIAGNOSIS — I959 Hypotension, unspecified: Secondary | ICD-10-CM | POA: Insufficient documentation

## 2022-02-18 DIAGNOSIS — E032 Hypothyroidism due to medicaments and other exogenous substances: Secondary | ICD-10-CM

## 2022-02-18 LAB — CBC WITH DIFFERENTIAL (CANCER CENTER ONLY)
Abs Immature Granulocytes: 0.03 10*3/uL (ref 0.00–0.07)
Basophils Absolute: 0.1 10*3/uL (ref 0.0–0.1)
Basophils Relative: 1 %
Eosinophils Absolute: 0.2 10*3/uL (ref 0.0–0.5)
Eosinophils Relative: 3 %
HCT: 26.2 % — ABNORMAL LOW (ref 39.0–52.0)
Hemoglobin: 8 g/dL — ABNORMAL LOW (ref 13.0–17.0)
Immature Granulocytes: 0 %
Lymphocytes Relative: 16 %
Lymphs Abs: 1.2 10*3/uL (ref 0.7–4.0)
MCH: 23.1 pg — ABNORMAL LOW (ref 26.0–34.0)
MCHC: 30.5 g/dL (ref 30.0–36.0)
MCV: 75.7 fL — ABNORMAL LOW (ref 80.0–100.0)
Monocytes Absolute: 0.7 10*3/uL (ref 0.1–1.0)
Monocytes Relative: 10 %
Neutro Abs: 5.2 10*3/uL (ref 1.7–7.7)
Neutrophils Relative %: 70 %
Platelet Count: 351 10*3/uL (ref 150–400)
RBC: 3.46 MIL/uL — ABNORMAL LOW (ref 4.22–5.81)
RDW: 17.8 % — ABNORMAL HIGH (ref 11.5–15.5)
WBC Count: 7.3 10*3/uL (ref 4.0–10.5)
nRBC: 0 % (ref 0.0–0.2)

## 2022-02-18 LAB — CMP (CANCER CENTER ONLY)
ALT: 9 U/L (ref 0–44)
AST: 12 U/L — ABNORMAL LOW (ref 15–41)
Albumin: 3.6 g/dL (ref 3.5–5.0)
Alkaline Phosphatase: 82 U/L (ref 38–126)
Anion gap: 8 (ref 5–15)
BUN: 10 mg/dL (ref 8–23)
CO2: 27 mmol/L (ref 22–32)
Calcium: 8.8 mg/dL — ABNORMAL LOW (ref 8.9–10.3)
Chloride: 99 mmol/L (ref 98–111)
Creatinine: 1.25 mg/dL — ABNORMAL HIGH (ref 0.61–1.24)
GFR, Estimated: 58 mL/min — ABNORMAL LOW (ref >60)
Glucose, Bld: 140 mg/dL — ABNORMAL HIGH (ref 70–99)
Potassium: 3.9 mmol/L (ref 3.5–5.1)
Sodium: 134 mmol/L — ABNORMAL LOW (ref 135–145)
Total Bilirubin: 0.4 mg/dL (ref 0.3–1.2)
Total Protein: 7.4 g/dL (ref 6.5–8.1)

## 2022-02-18 LAB — IRON AND IRON BINDING CAPACITY (CC-WL,HP ONLY)
Iron: 18 ug/dL — ABNORMAL LOW (ref 45–182)
Saturation Ratios: 6 % — ABNORMAL LOW (ref 17.9–39.5)
TIBC: 315 ug/dL (ref 250–450)
UIBC: 297 ug/dL (ref 117–376)

## 2022-02-18 LAB — FERRITIN: Ferritin: 19 ng/mL — ABNORMAL LOW (ref 24–336)

## 2022-02-18 LAB — TSH: TSH: 0.931 u[IU]/mL (ref 0.350–4.500)

## 2022-02-18 MED ORDER — SODIUM CHLORIDE 0.9 % IV SOLN: Freq: Once | INTRAVENOUS | Status: AC

## 2022-02-18 MED ORDER — SODIUM CHLORIDE 0.9 % IV SOLN
200.0000 mg | Freq: Once | INTRAVENOUS | Status: AC
  Administered 2022-02-18: 200 mg via INTRAVENOUS
  Filled 2022-02-18: qty 200

## 2022-02-18 NOTE — Patient Instructions (Signed)
[facility]  Discharge Instructions: ?Thank you for choosing [facility] to provide your oncology and hematology care.  ? ?If you have a lab appointment with the Cancer Center, please go directly to the Cancer Center and check in at the registration area. ?  ?Wear comfortable clothing and clothing appropriate for easy access to any Portacath or PICC line.  ? ?We strive to give you quality time with your provider. You may need to reschedule your appointment if you arrive late (15 or more minutes).  Arriving late affects you and other patients whose appointments are after yours.  Also, if you miss three or more appointments without notifying the office, you may be dismissed from the clinic at the provider?s discretion.    ?  ?For prescription refill requests, have your pharmacy contact our office and allow 72 hours for refills to be completed.   ? ?Today you received the following chemotherapy and/or immunotherapy agents: Keytruda ?  ?To help prevent nausea and vomiting after your treatment, we encourage you to take your nausea medication as directed. ? ?BELOW ARE SYMPTOMS THAT SHOULD BE REPORTED IMMEDIATELY: ?*FEVER GREATER THAN 100.4 F (38 ?C) OR HIGHER ?*CHILLS OR SWEATING ?*NAUSEA AND VOMITING THAT IS NOT CONTROLLED WITH YOUR NAUSEA MEDICATION ?*UNUSUAL SHORTNESS OF BREATH ?*UNUSUAL BRUISING OR BLEEDING ?*URINARY PROBLEMS (pain or burning when urinating, or frequent urination) ?*BOWEL PROBLEMS (unusual diarrhea, constipation, pain near the anus) ?TENDERNESS IN MOUTH AND THROAT WITH OR WITHOUT PRESENCE OF ULCERS (sore throat, sores in mouth, or a toothache) ?UNUSUAL RASH, SWELLING OR PAIN  ?UNUSUAL VAGINAL DISCHARGE OR ITCHING  ? ?Items with * indicate a potential emergency and should be followed up as soon as possible or go to the Emergency Department if any problems should occur. ? ?Please show the CHEMOTHERAPY ALERT CARD or IMMUNOTHERAPY ALERT CARD at check-in to the  Emergency Department and triage nurse. ? ?Should you have questions after your visit or need to cancel or reschedule your appointment, please contact [facility]  Dept: [phone]  and follow the prompts.  Office hours are 8:00 a.m. to 4:30 p.m. Monday - Friday. Please note that voicemails left after 4:00 p.m. may not be returned until the following business day.  We are closed weekends and major holidays. You have access to a nurse at all times for urgent questions. Please call the main number to the clinic Dept: [phone] and follow the prompts. ? ? ?For any non-urgent questions, you may also contact your provider using MyChart. We now offer e-Visits for anyone 18 and older to request care online for non-urgent symptoms. For details visit [email or web address]. ?  ?Also download the MyChart app! Go to the app store, search "MyChart", open the app, select [facility], and log in with your MyChart username and password. ? ?Due to Covid, a mask is required upon entering the hospital/clinic. If you do not have a mask, one will be given to you upon arrival. For doctor visits, patients may have 1 support person aged 18 or older with them. For treatment visits, patients cannot have anyone with them due to current Covid guidelines and our immunocompromised population.  ? ?

## 2022-02-18 NOTE — Progress Notes (Signed)
Hematology and Oncology Follow Up   Thomas Michael [id number] 03/21/1941 81 y.o. 02/18/2022 1:08 PM Thomas Michael, MDWolters, [name], MD      Principle Diagnosis: 81 year old man with kidney cancer diagnosed in 2012.  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     Prior Therapy:   He is status post right radical nephrectomy in May 2012.  He was found to have stage Ib tumor.  He is status post open treatment of impending pathological fracture of the left proximal femur with intramedullary implant completed on October 01, 2021.   Current therapy: Pembrolizumab 200 mg every 3 weeks started on September 08, 2021.  He is here for cycle 8 of therapy.   Interim History: Thomas Michael returns today for repeat evaluation.  Since last visit, he reports no major changes in his health.  He continues to be on Pembrolizumab without any major complaints.  He denies any nausea, vomiting or abdominal pain.  He has reported mild lower extremity edema.  He is currently on hydrocodone which is managing his pain adequately.  He does have occasional constipation.     Medications: Updated on review. Current Outpatient Medications  Medication Sig Dispense Refill   Accu-Chek FastClix Lancets MISC check CBG     acetaminophen (TYLENOL) 500 MG tablet Take 1,000 mg by mouth every 8 (eight) hours as needed for moderate pain.     axitinib (INLYTA) 5 MG tablet Take 1 tablet (5 mg total) by mouth daily. 30 tablet 1   Blood Glucose Monitoring Suppl (ACCU-CHEK AVIVA PLUS) w/Device KIT check CBG     chlorpheniramine-HYDROcodone 10-8 MG/5ML Take 5 mLs by mouth at bedtime as needed for cough. 473 mL 0   Cholecalciferol 50 MCG (2000 UT) TABS Take 2,000 Units by mouth daily at 12 noon.     ciclopirox (PENLAC) 8 % solution Apply 1 application topically daily as needed (fungus (toes)). Apply over nail and surrounding skin. Apply daily as needed over previous coat.      clopidogrel (PLAVIX) 75 MG tablet Take 75 mg by mouth at bedtime.     diphenhydrAMINE (BENADRYL) 25 MG tablet Take 50 mg by mouth at bedtime.     docusate sodium (COLACE) 100 MG capsule Take 200 mg by mouth daily as needed for mild constipation.     ferrous sulfate 325 (65 FE) MG tablet Take 1 tablet (325 mg total) by mouth 2 (two) times daily. 60 tablet 3   guaiFENesin (MUCINEX) 600 MG 12 hr tablet Take 1,200 mg by mouth 2 (two) times daily as needed for to loosen phlegm or cough.     HYDROcodone-acetaminophen (NORCO) 10-325 MG tablet Take 1 tablet by mouth every 6 (six) hours as needed. 120 tablet 0   levothyroxine (SYNTHROID, LEVOTHROID) 200 MCG tablet Take 200 mcg by mouth every evening.     magic mouthwash w/lidocaine SOLN Take 5 mLs by mouth 4 (four) times daily. 5 ml QID PRN mouth sores, swish and spit 240 mL 0   metoprolol (TOPROL-XL) 50 MG 24 hr tablet Take 50 mg by mouth in the morning.     Multiple Vitamin (MULTIVITAMIN WITH MINERALS) TABS tablet Take 1 tablet by mouth daily at 12 noon.     niacin 500 MG CR capsule Take 500 mg by mouth daily at 12 noon.     nystatin-triamcinolone ointment (MYCOLOG) Apply 1 application. topically 2 (two) times daily. 30 g 3   Omega-3 Fatty Acids (  FISH OIL PO) Take 1,000 mg by mouth daily at 12 noon.     polyethylene glycol (MIRALAX MIX-IN PAX) 17 g packet Take 17 g by mouth daily. 14 each 0   prochlorperazine (COMPAZINE) 10 MG tablet Take 1 tablet (10 mg total) by mouth every 6 (six) hours as needed for nausea or vomiting. 30 tablet 0   rOPINIRole (REQUIP) 0.25 MG tablet Take 0.25 mg by mouth 2 (two) times daily.     rosuvastatin (CRESTOR) 20 MG tablet Take 20 mg by mouth in the morning.     senna (SENOKOT) 8.6 MG TABS tablet Take 1 tablet (8.6 mg total) by mouth 2 (two) times daily. 60 tablet 0   trazodone (DESYREL) 300 MG tablet Take 300 mg by mouth at bedtime.     valsartan-hydrochlorothiazide (DIOVAN-HCT) 160-12.5 MG per tablet Take 1 tablet by  mouth in the morning.     VICTOZA 18 MG/3ML SOPN Inject 1.2 mg into the skin every evening.     vitamin E 400 UNIT capsule Take 400 Units by mouth daily at 12 noon.     No current facility-administered medications for this visit.     Allergies:  Allergies  Allergen Reactions   Oxycodone     Other reaction(s): vomiting   Promethazine-Codeine Other (See Comments)    Pt does not remember reaction   Physical exam     Blood pressure (!) 142/64, pulse 71, temperature 98 F (36.7 C), temperature source Oral, resp. rate 19, height 5' 6"  (1.676 m), weight 218 lb 12.8 oz (99.2 kg), SpO2 97 %.    ECOG 1    General appearance: Comfortable appearing without any discomfort Head: Normocephalic without any trauma Oropharynx: Mucous membranes are moist and pink without any thrush or ulcers. Eyes: Pupils are equal and round reactive to light. Lymph nodes: No cervical, supraclavicular, inguinal or axillary lymphadenopathy.   Heart:regular rate and rhythm.  S1 and S2.  Trace edema over the ankle. Lung: Clear without any rhonchi or wheezes.  No dullness to percussion. Abdomin: Soft, nontender, nondistended with good bowel sounds.  No hepatosplenomegaly. Musculoskeletal: No joint deformity or effusion.  Full range of motion noted. Neurological: No deficits noted on motor, sensory and deep tendon reflex exam. Skin: No petechial rash or dryness.  Appeared moist.        Lab Results: Lab Results  Component Value Date   WBC 6.9 01/26/2022   HGB 7.8 (L) 01/26/2022   HCT 25.6 (L) 01/26/2022   MCV 76.2 (L) 01/26/2022   PLT 301 01/26/2022     Chemistry      Component Value Date/Time   NA 136 01/26/2022 1119   K 3.8 01/26/2022 1119   CL 102 01/26/2022 1119   CO2 28 01/26/2022 1119   BUN 10 01/26/2022 1119   CREATININE 1.08 01/26/2022 1119      Component Value Date/Time   CALCIUM 9.0 01/26/2022 1119   ALKPHOS 71 01/26/2022 1119   AST 12 (L) 01/26/2022 1119   ALT 10 01/26/2022  1119   BILITOT 0.3 01/26/2022 1119      I     Impression and Plan:  88 year old with:  1.  Kidney cancer diagnosed in 2012.  He developed stage IV intermediate risk clear-cell with bone, pulmonary disease in addition to lymphadenopathy in 2023.   The natural course of this disease was reviewed at this time and treatment choices were discussed.  He is currently on Pembrolizumab which she has tolerated very well without any major  issues.  Risks and benefits of continuing this treatment were discussed.  Autoimmune complications as well as dermatological issues and GI concerns were reviewed.  CT scan obtained in June 2023 showed overall stable disease.  He is agreeable to proceed and we will update his staging scans at the end of September.     2.  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  He is status post surgical fixation.   3.  Bone pain: He remains on hydrocodone with pain is manageable.   4.  Immune mediated complications: I continue to educate him about potential complication including pneumonitis, colitis and thyroid disease.  5.  Anemia: Multifactorial in nature with iron deficiency as a contributing factor.  He is on oral iron replacement.  Intravenous iron replacement could be considered if needed.  His hemoglobin is improving and iron studies will continue to be monitored.  He will continue oral iron for the time being.  6.  Hypotension: Blood pressure is back to close to normal range.   7.  Follow-up: He will return in 3 weeks for follow-up.   30  minutes were spent on this visit.  The time was dedicated to reviewing laboratory data, disease status update, treatment choices as well as complication related to cancer and cancer therapy.   Zola Button, MD 02/18/2022 1:08 PM

## 2022-02-19 ENCOUNTER — Other Ambulatory Visit: Payer: Self-pay

## 2022-02-20 ENCOUNTER — Other Ambulatory Visit: Payer: Self-pay

## 2022-02-20 DIAGNOSIS — C7800 Secondary malignant neoplasm of unspecified lung: Secondary | ICD-10-CM | POA: Diagnosis not present

## 2022-02-20 DIAGNOSIS — G893 Neoplasm related pain (acute) (chronic): Secondary | ICD-10-CM | POA: Diagnosis not present

## 2022-02-20 DIAGNOSIS — C7951 Secondary malignant neoplasm of bone: Secondary | ICD-10-CM | POA: Diagnosis not present

## 2022-02-20 DIAGNOSIS — C641 Malignant neoplasm of right kidney, except renal pelvis: Secondary | ICD-10-CM | POA: Diagnosis not present

## 2022-02-20 DIAGNOSIS — C779 Secondary and unspecified malignant neoplasm of lymph node, unspecified: Secondary | ICD-10-CM | POA: Diagnosis not present

## 2022-02-20 DIAGNOSIS — M84552D Pathological fracture in neoplastic disease, left femur, subsequent encounter for fracture with routine healing: Secondary | ICD-10-CM | POA: Diagnosis not present

## 2022-02-23 ENCOUNTER — Other Ambulatory Visit: Payer: Self-pay | Admitting: Oncology

## 2022-02-23 DIAGNOSIS — M84552D Pathological fracture in neoplastic disease, left femur, subsequent encounter for fracture with routine healing: Secondary | ICD-10-CM | POA: Diagnosis not present

## 2022-02-23 DIAGNOSIS — C641 Malignant neoplasm of right kidney, except renal pelvis: Secondary | ICD-10-CM | POA: Diagnosis not present

## 2022-02-23 DIAGNOSIS — C649 Malignant neoplasm of unspecified kidney, except renal pelvis: Secondary | ICD-10-CM

## 2022-02-23 DIAGNOSIS — G893 Neoplasm related pain (acute) (chronic): Secondary | ICD-10-CM | POA: Diagnosis not present

## 2022-02-23 DIAGNOSIS — C7800 Secondary malignant neoplasm of unspecified lung: Secondary | ICD-10-CM | POA: Diagnosis not present

## 2022-02-23 DIAGNOSIS — C7951 Secondary malignant neoplasm of bone: Secondary | ICD-10-CM | POA: Diagnosis not present

## 2022-02-23 DIAGNOSIS — C779 Secondary and unspecified malignant neoplasm of lymph node, unspecified: Secondary | ICD-10-CM | POA: Diagnosis not present

## 2022-02-23 MED ORDER — HYDROCODONE-ACETAMINOPHEN 10-325 MG PO TABS: 1.0000 | ORAL_TABLET | Freq: Four times a day (QID) | ORAL | 0 refills | Status: DC | PRN

## 2022-02-26 ENCOUNTER — Telehealth: Payer: Self-pay

## 2022-02-26 DIAGNOSIS — M84552D Pathological fracture in neoplastic disease, left femur, subsequent encounter for fracture with routine healing: Secondary | ICD-10-CM | POA: Diagnosis not present

## 2022-02-26 DIAGNOSIS — G893 Neoplasm related pain (acute) (chronic): Secondary | ICD-10-CM | POA: Diagnosis not present

## 2022-02-26 DIAGNOSIS — C7951 Secondary malignant neoplasm of bone: Secondary | ICD-10-CM | POA: Diagnosis not present

## 2022-02-26 DIAGNOSIS — C779 Secondary and unspecified malignant neoplasm of lymph node, unspecified: Secondary | ICD-10-CM | POA: Diagnosis not present

## 2022-02-26 DIAGNOSIS — C641 Malignant neoplasm of right kidney, except renal pelvis: Secondary | ICD-10-CM | POA: Diagnosis not present

## 2022-02-26 DIAGNOSIS — C7800 Secondary malignant neoplasm of unspecified lung: Secondary | ICD-10-CM | POA: Diagnosis not present

## 2022-02-26 NOTE — Telephone Encounter (Signed)
Pt called stating that he will run out of Norco on 8/13 and it cannot be refilled until 8/16 at [facility] in [place], [place]. Pt reports not taking outside of prescribed frequency. Patient spoke with pharmacy manager, Marjory Lies. I called pharmacy manager and he has agreed to refill early but requested that note be placed in chart referring to this conversation.

## 2022-02-28 ENCOUNTER — Other Ambulatory Visit: Payer: Self-pay | Admitting: Oncology

## 2022-02-28 DIAGNOSIS — C649 Malignant neoplasm of unspecified kidney, except renal pelvis: Secondary | ICD-10-CM

## 2022-03-01 ENCOUNTER — Telehealth: Payer: Self-pay | Admitting: Oncology

## 2022-03-01 NOTE — Telephone Encounter (Signed)
Scheduled per 08/03 los, patient has been called and notified. 

## 2022-03-02 DIAGNOSIS — C7951 Secondary malignant neoplasm of bone: Secondary | ICD-10-CM | POA: Diagnosis not present

## 2022-03-02 DIAGNOSIS — C641 Malignant neoplasm of right kidney, except renal pelvis: Secondary | ICD-10-CM | POA: Diagnosis not present

## 2022-03-02 DIAGNOSIS — C7800 Secondary malignant neoplasm of unspecified lung: Secondary | ICD-10-CM | POA: Diagnosis not present

## 2022-03-02 DIAGNOSIS — M84552D Pathological fracture in neoplastic disease, left femur, subsequent encounter for fracture with routine healing: Secondary | ICD-10-CM | POA: Diagnosis not present

## 2022-03-02 DIAGNOSIS — C779 Secondary and unspecified malignant neoplasm of lymph node, unspecified: Secondary | ICD-10-CM | POA: Diagnosis not present

## 2022-03-02 DIAGNOSIS — G893 Neoplasm related pain (acute) (chronic): Secondary | ICD-10-CM | POA: Diagnosis not present

## 2022-03-03 ENCOUNTER — Ambulatory Visit: Payer: Medicare Other

## 2022-03-03 ENCOUNTER — Ambulatory Visit: Payer: Medicare Other | Admitting: Oncology

## 2022-03-03 ENCOUNTER — Other Ambulatory Visit: Payer: Medicare Other

## 2022-03-04 NOTE — Progress Notes (Signed)
[facility] OFFICE PROGRESS NOTE  Thomas Jordan, MD [address] [place] [address]  DIAGNOSIS:  81 year old man with kidney cancer diagnosed in 2012.  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  PRIOR THERAPY: He is status post right radical nephrectomy in May 2012.  He was found to have stage Ib tumor.   He is status post open treatment of impending pathological fracture of the left proximal femur with intramedullary implant completed on October 01, 2021.  CURRENT THERAPY:  Pembrolizumab 200 mg every 3 weeks started on September 08, 2021.  He is here for cycle 9 of therapy.   INTERVAL HISTORY: Thomas Michael 81 y.o. male returns to the clinic today for a follow-up visit.  The patient is feeling fairly well today without any major changes in his health. The patient is still working and is pleased that he is able to have quality of life. The patient is currently undergoing treatment with immunotherapy with Keytruda.  He tolerates this well without any concerning adverse side effects except for itching. He uses Sarna lotion. He has a home health nurse come for wound care. They came to his house today and requested that he ask [place] to renew the referral for his home health to twice a week. They took his BP today which was reportedly in the "optimal range" and was around 626 systolic. His BP was read as 95/45 which is unusual for him. He is asymptomatic. He reports he takes valsartan for his BP. He took it today. Today he denies any new fever, chills, or night sweats. Since being diagnosed with his condition, he reports he lost over 50 lbs. He reports his weight has stabilitzed over the last few visits. However, his weight today looks like he lost 10 lbs. He denies changes with his diet. He notes he is a picky eater though and he has been having taste changes. He reports he has small frequent meals. When we  discussed protein supplements, he mentions it has been suggested in the past. He has borderline diabetes. Denies any cough or shortness of breath.  Denies any nausea, vomiting, diarrhea, or constipation.  Denies any abdominal pain.  Denies any changes with his urinary habits.  He currently continues to take Norco for left leg pain.  He is also on oral iron supplement for his iron deficiency anemia.  He states he is compliant with this and takes it at least once a day.   He is here today for evaluation repeat blood work before undergoing cycle #9.    MEDICAL HISTORY: Past Medical History:  Diagnosis Date   CAD (coronary artery disease)    Diabetes mellitus without complication ([facility])    Dyspnea    Facial nerve disorder, unspecified    HLD (hyperlipidemia)    HTN (hypertension)    Jan 2010 He had a 95% mid LAD lesion. There was 99% stenosis at the distal edge of the stented area in the right coronary artery. The EF was 65%. He had drug-eluting stents placed in both the LAD and the right coronary artery.   Hypothyroidism    Obesity    Other facial nerve disorders    Renal cell carcinoma of right kidney (HCC)    Syncope    Unspecified ptosis of eyelid     ALLERGIES:  is allergic to oxycodone and promethazine-codeine.  MEDICATIONS:  Current Outpatient Medications  Medication Sig Dispense Refill  Accu-Chek FastClix Lancets MISC check CBG     acetaminophen (TYLENOL) 500 MG tablet Take 1,000 mg by mouth every 8 (eight) hours as needed for moderate pain.     axitinib (INLYTA) 5 MG tablet Take 1 tablet (5 mg total) by mouth daily. 30 tablet 1   Blood Glucose Monitoring Suppl (ACCU-CHEK AVIVA PLUS) w/Device KIT check CBG     chlorpheniramine-HYDROcodone 10-8 MG/5ML Take 5 mLs by mouth at bedtime as needed for cough. 473 mL 0   Cholecalciferol 50 MCG (2000 UT) TABS Take 2,000 Units by mouth daily at 12 noon.     ciclopirox (PENLAC) 8 % solution Apply 1 application topically daily as needed  (fungus (toes)). Apply over nail and surrounding skin. Apply daily as needed over previous coat.     clopidogrel (PLAVIX) 75 MG tablet Take 75 mg by mouth at bedtime.     diphenhydrAMINE (BENADRYL) 25 MG tablet Take 50 mg by mouth at bedtime.     docusate sodium (COLACE) 100 MG capsule Take 200 mg by mouth daily as needed for mild constipation.     ferrous sulfate 325 (65 FE) MG tablet Take 1 tablet (325 mg total) by mouth 2 (two) times daily. 60 tablet 3   guaiFENesin (MUCINEX) 600 MG 12 hr tablet Take 1,200 mg by mouth 2 (two) times daily as needed for to loosen phlegm or cough.     HYDROcodone-acetaminophen (NORCO) 10-325 MG tablet Take 1 tablet by mouth every 6 (six) hours as needed. 120 tablet 0   levothyroxine (SYNTHROID, LEVOTHROID) 200 MCG tablet Take 200 mcg by mouth every evening.     magic mouthwash w/lidocaine SOLN Take 5 mLs by mouth 4 (four) times daily. 5 ml QID PRN mouth sores, swish and spit 240 mL 0   metoprolol (TOPROL-XL) 50 MG 24 hr tablet Take 50 mg by mouth in the morning.     Multiple Vitamin (MULTIVITAMIN WITH MINERALS) TABS tablet Take 1 tablet by mouth daily at 12 noon.     niacin 500 MG CR capsule Take 500 mg by mouth daily at 12 noon.     nystatin-triamcinolone ointment (MYCOLOG) Apply 1 application. topically 2 (two) times daily. 30 g 3   Omega-3 Fatty Acids (FISH OIL PO) Take 1,000 mg by mouth daily at 12 noon.     polyethylene glycol (MIRALAX MIX-IN PAX) 17 g packet Take 17 g by mouth daily. 14 each 0   prochlorperazine (COMPAZINE) 10 MG tablet Take 1 tablet (10 mg total) by mouth every 6 (six) hours as needed for nausea or vomiting. 30 tablet 0   rOPINIRole (REQUIP) 0.25 MG tablet Take 0.25 mg by mouth 2 (two) times daily.     rosuvastatin (CRESTOR) 20 MG tablet Take 20 mg by mouth in the morning.     senna (SENOKOT) 8.6 MG TABS tablet Take 1 tablet (8.6 mg total) by mouth 2 (two) times daily. 60 tablet 0   trazodone (DESYREL) 300 MG tablet Take 300 mg by mouth at  bedtime.     valsartan-hydrochlorothiazide (DIOVAN-HCT) 160-12.5 MG per tablet Take 1 tablet by mouth in the morning.     VICTOZA 18 MG/3ML SOPN Inject 1.2 mg into the skin every evening.     vitamin E 400 UNIT capsule Take 400 Units by mouth daily at 12 noon.     No current facility-administered medications for this visit.    SURGICAL HISTORY:  Past Surgical History:  Procedure Laterality Date   CHOLECYSTECTOMY  CHOLECYSTECTOMY     CORONARY STENT PLACEMENT     FEMUR IM NAIL Left 10/01/2021   Procedure: left intertrochanteric intramedullary nail;  Surgeon: Leandrew Koyanagi, MD;  Location: [facility];  Service: Orthopedics;  Laterality: Left;   MENISCECTOMY Right    NEPHRECTOMY RADICAL Right 2012   TONSILLECTOMY      REVIEW OF SYSTEMS:   Review of Systems  Constitutional: Positive for weight loss. Negative for appetite change, chills, fatigue, and fever.  HENT: Negative for mouth sores, nosebleeds, sore throat and trouble swallowing.   Eyes: Negative for eye problems and icterus.  Respiratory: Negative for cough, hemoptysis, shortness of breath and wheezing.   Cardiovascular: Negative for chest pain and leg swelling.  Gastrointestinal: Negative for abdominal pain, constipation, diarrhea, nausea and vomiting.  Genitourinary: Negative for bladder incontinence, difficulty urinating, dysuria, frequency and hematuria.   Musculoskeletal: Positive for left leg pain. Negative for back pain, gait problem, neck pain and neck stiffness.  Skin: Positive for itching. Positive for extremity bruising.  Neurological: Negative for dizziness, extremity weakness, gait problem, headaches, light-headedness and seizures.  Hematological: Negative for adenopathy. Does not bleed easily. Positive for bruising.  Psychiatric/Behavioral: Negative for confusion, depression and sleep disturbance. The patient is not nervous/anxious.     PHYSICAL EXAMINATION:  Blood pressure (!) 95/45, pulse 64, temperature 97.8 F  (36.6 C), temperature source Oral, resp. rate 16, height 5' 6"  (1.676 m), weight 208 lb 12.8 oz (94.7 kg), SpO2 99 %. BP rechecked at 112/55.   ECOG PERFORMANCE STATUS: 2  Physical Exam  Constitutional: Oriented to person, place, and time and well-developed, well-nourished, and in no distress.  HENT:  Head: Normocephalic and atraumatic.  Mouth/Throat: Oropharynx is clear and moist. No oropharyngeal exudate.  Eyes: Conjunctivae are normal. Right eye exhibits no discharge. Left eye exhibits no discharge. No scleral icterus.  Neck: Normal range of motion. Neck supple.  Cardiovascular: Normal rate, regular rhythm, normal heart sounds and intact distal pulses.   Pulmonary/Chest: Effort normal and breath sounds normal. No respiratory distress. No wheezes. No rales.  Abdominal: Soft. Bowel sounds are normal. Exhibits no distension and no mass. There is no tenderness.  Musculoskeletal: Normal range of motion. Exhibits no edema.  Lymphadenopathy:    No cervical adenopathy.  Neurological: Alert and oriented to person, place, and time. Exhibits normal muscle tone. Examined in the wheelchair.  Skin: Skin is warm and dry. No rash noted. Not diaphoretic. No erythema. No pallor. Positive for extremity bruising.  Psychiatric: Mood, memory and judgment normal.  Vitals reviewed.  LABORATORY DATA: Lab Results  Component Value Date   WBC 6.8 03/09/2022   HGB 7.9 (L) 03/09/2022   HCT 25.0 (L) 03/09/2022   MCV 72.5 (L) 03/09/2022   PLT 347 03/09/2022      Chemistry      Component Value Date/Time   NA 130 (L) 03/09/2022 1416   K 3.8 03/09/2022 1416   CL 95 (L) 03/09/2022 1416   CO2 29 03/09/2022 1416   BUN 15 03/09/2022 1416   CREATININE 1.22 03/09/2022 1416      Component Value Date/Time   CALCIUM 9.7 03/09/2022 1416   ALKPHOS 79 03/09/2022 1416   AST 15 03/09/2022 1416   ALT 15 03/09/2022 1416   BILITOT 0.4 03/09/2022 1416       RADIOGRAPHIC STUDIES:  No results  found.   ASSESSMENT/PLAN:  21 year old with:  1.  Kidney cancer diagnosed in 2012.  He developed stage IV intermediate risk clear-cell with bone, pulmonary  disease in addition to lymphadenopathy in 2023.   He is currently on Pembrolizumab which she has tolerated very well without any major issues except itching.  Labs were reviewed. He has stable anemia. Recommend that he proceed with cycle #9 today as scheduled. The patient is scheduled to see Dr. Alen Blew in 3 weeks. Per his last note, Dr. Alen Blew is planning on ordering a restaging scan at the end of September.  Dr. Alen Blew will likely order this at his next appointment.    2.  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  He is status post surgical fixation.    3.  Bone pain: He remains on hydrocodone with pain is manageable.    4.  Immune mediated complications: we will continue to educate him about potential complication including pneumonitis, colitis and thyroid disease.   5.  Anemia: Per Dr. Hazeline Junker last note, felt to be multifactorial in nature with iron deficiency as a contributing factor.  He is on oral iron replacement. His iron studies continue to be low. I encouraged the patient to take his iron with vitamin C. I also gave him a handout of iron rich food on his AVS. Discussed him and Dr. Alen Blew can consider intravenous iron replacement at his next appointment if no improvement.  He will continue oral iron for the time being.  His hemoglobin is stable around 8 (7.9 today).    6.  Hypotension: His BP today initially read as 95/44. Rechecked at 112/55. The patient is on valsartan. Advised the patient to check at home daily and keep a log of his readings. If his systolic BP is <269-485, advised to skip his BP dose. If his BP continues to be low/normal, encouraged him to talk to his PCP about discontinuing or reducing the dose of valsartan.  Patient was encouraged to hydrate.  7. Wound Care: The patient  is requesting renewal of the orders for home health/wound care. We will renew this service.   8. Itching: Could be related to immunotherapy. Encouraged to use hydrocortisone cream. For generalized itching, encouraged to take anti-histamine. Discussed benadryl will cause drowsiness.  The patient does not like taking Benadryl.  Advised to take Zyrtec, Claritin, or Allegra.  If he needs any additional creams for itching, if hydrocortisone cream does not work can consider Kenalog cream.  9.  Weight loss: The patient apparently lost 10 pounds since his last appointment.  The patient denies any changes in his diet although he generally has a hard time finding foods that are appetizing.  He eats small frequent meals.  Encouraged the patient to pick up supplemental protein drinks.  Because he is borderline diabetic, he may want to consider drinking the diabetic supplemental drinks.  We will continue to monitor his weight closely.   10.  Follow-up: He will return in 3 weeks for follow-up.     No orders of the defined types were placed in this encounter.     The total time spent in the appointment was 20-29 minutes   Aaryanna Hyden L Diavian Furgason, PA-C 03/09/22

## 2022-03-05 ENCOUNTER — Other Ambulatory Visit (HOSPITAL_COMMUNITY): Payer: Self-pay

## 2022-03-05 DIAGNOSIS — C779 Secondary and unspecified malignant neoplasm of lymph node, unspecified: Secondary | ICD-10-CM | POA: Diagnosis not present

## 2022-03-05 DIAGNOSIS — G893 Neoplasm related pain (acute) (chronic): Secondary | ICD-10-CM | POA: Diagnosis not present

## 2022-03-05 DIAGNOSIS — M84552D Pathological fracture in neoplastic disease, left femur, subsequent encounter for fracture with routine healing: Secondary | ICD-10-CM | POA: Diagnosis not present

## 2022-03-05 DIAGNOSIS — C641 Malignant neoplasm of right kidney, except renal pelvis: Secondary | ICD-10-CM | POA: Diagnosis not present

## 2022-03-05 DIAGNOSIS — C7800 Secondary malignant neoplasm of unspecified lung: Secondary | ICD-10-CM | POA: Diagnosis not present

## 2022-03-05 DIAGNOSIS — C7951 Secondary malignant neoplasm of bone: Secondary | ICD-10-CM | POA: Diagnosis not present

## 2022-03-09 ENCOUNTER — Ambulatory Visit: Payer: Medicare Other | Admitting: Physician Assistant

## 2022-03-09 ENCOUNTER — Other Ambulatory Visit: Payer: Self-pay

## 2022-03-09 ENCOUNTER — Inpatient Hospital Stay: Payer: Medicare Other

## 2022-03-09 ENCOUNTER — Ambulatory Visit: Payer: Medicare Other

## 2022-03-09 ENCOUNTER — Inpatient Hospital Stay (HOSPITAL_BASED_OUTPATIENT_CLINIC_OR_DEPARTMENT_OTHER): Payer: Medicare Other | Admitting: Physician Assistant

## 2022-03-09 ENCOUNTER — Other Ambulatory Visit: Payer: Medicare Other

## 2022-03-09 VITALS — BP 95/45 | HR 64 | Temp 97.8°F | Resp 16 | Ht 66.0 in | Wt 208.8 lb

## 2022-03-09 VITALS — BP 112/55 | HR 63

## 2022-03-09 DIAGNOSIS — M84552D Pathological fracture in neoplastic disease, left femur, subsequent encounter for fracture with routine healing: Secondary | ICD-10-CM | POA: Diagnosis not present

## 2022-03-09 DIAGNOSIS — C649 Malignant neoplasm of unspecified kidney, except renal pelvis: Secondary | ICD-10-CM | POA: Diagnosis not present

## 2022-03-09 DIAGNOSIS — Z905 Acquired absence of kidney: Secondary | ICD-10-CM | POA: Diagnosis not present

## 2022-03-09 DIAGNOSIS — C775 Secondary and unspecified malignant neoplasm of intrapelvic lymph nodes: Secondary | ICD-10-CM | POA: Diagnosis not present

## 2022-03-09 DIAGNOSIS — C641 Malignant neoplasm of right kidney, except renal pelvis: Secondary | ICD-10-CM | POA: Diagnosis not present

## 2022-03-09 DIAGNOSIS — C779 Secondary and unspecified malignant neoplasm of lymph node, unspecified: Secondary | ICD-10-CM | POA: Diagnosis not present

## 2022-03-09 DIAGNOSIS — G893 Neoplasm related pain (acute) (chronic): Secondary | ICD-10-CM | POA: Diagnosis not present

## 2022-03-09 DIAGNOSIS — C7951 Secondary malignant neoplasm of bone: Secondary | ICD-10-CM | POA: Diagnosis not present

## 2022-03-09 DIAGNOSIS — Z85528 Personal history of other malignant neoplasm of kidney: Secondary | ICD-10-CM | POA: Diagnosis not present

## 2022-03-09 DIAGNOSIS — Z5112 Encounter for antineoplastic immunotherapy: Secondary | ICD-10-CM | POA: Diagnosis not present

## 2022-03-09 DIAGNOSIS — C7800 Secondary malignant neoplasm of unspecified lung: Secondary | ICD-10-CM | POA: Diagnosis not present

## 2022-03-09 LAB — CBC WITH DIFFERENTIAL (CANCER CENTER ONLY)
Abs Immature Granulocytes: 0.01 10*3/uL (ref 0.00–0.07)
Basophils Absolute: 0.1 10*3/uL (ref 0.0–0.1)
Basophils Relative: 1 %
Eosinophils Absolute: 0.3 10*3/uL (ref 0.0–0.5)
Eosinophils Relative: 4 %
HCT: 25 % — ABNORMAL LOW (ref 39.0–52.0)
Hemoglobin: 7.9 g/dL — ABNORMAL LOW (ref 13.0–17.0)
Immature Granulocytes: 0 %
Lymphocytes Relative: 14 %
Lymphs Abs: 1 10*3/uL (ref 0.7–4.0)
MCH: 22.9 pg — ABNORMAL LOW (ref 26.0–34.0)
MCHC: 31.6 g/dL (ref 30.0–36.0)
MCV: 72.5 fL — ABNORMAL LOW (ref 80.0–100.0)
Monocytes Absolute: 0.7 10*3/uL (ref 0.1–1.0)
Monocytes Relative: 10 %
Neutro Abs: 4.8 10*3/uL (ref 1.7–7.7)
Neutrophils Relative %: 71 %
Platelet Count: 347 10*3/uL (ref 150–400)
RBC: 3.45 MIL/uL — ABNORMAL LOW (ref 4.22–5.81)
RDW: 18 % — ABNORMAL HIGH (ref 11.5–15.5)
WBC Count: 6.8 10*3/uL (ref 4.0–10.5)
nRBC: 0 % (ref 0.0–0.2)

## 2022-03-09 LAB — CMP (CANCER CENTER ONLY)
ALT: 15 U/L (ref 0–44)
AST: 15 U/L (ref 15–41)
Albumin: 3.5 g/dL (ref 3.5–5.0)
Alkaline Phosphatase: 79 U/L (ref 38–126)
Anion gap: 6 (ref 5–15)
BUN: 15 mg/dL (ref 8–23)
CO2: 29 mmol/L (ref 22–32)
Calcium: 9.7 mg/dL (ref 8.9–10.3)
Chloride: 95 mmol/L — ABNORMAL LOW (ref 98–111)
Creatinine: 1.22 mg/dL (ref 0.61–1.24)
GFR, Estimated: 60 mL/min — ABNORMAL LOW (ref >60)
Glucose, Bld: 129 mg/dL — ABNORMAL HIGH (ref 70–99)
Potassium: 3.8 mmol/L (ref 3.5–5.1)
Sodium: 130 mmol/L — ABNORMAL LOW (ref 135–145)
Total Bilirubin: 0.4 mg/dL (ref 0.3–1.2)
Total Protein: 7.2 g/dL (ref 6.5–8.1)

## 2022-03-09 MED ORDER — SODIUM CHLORIDE 0.9 % IV SOLN
200.0000 mg | Freq: Once | INTRAVENOUS | Status: AC
  Administered 2022-03-09: 200 mg via INTRAVENOUS
  Filled 2022-03-09: qty 8

## 2022-03-09 MED ORDER — SODIUM CHLORIDE 0.9 % IV SOLN: Freq: Once | INTRAVENOUS | Status: AC

## 2022-03-10 LAB — T4: T4, Total: 10 ug/dL (ref 4.5–12.0)

## 2022-03-10 LAB — TSH: TSH: 0.187 u[IU]/mL — ABNORMAL LOW (ref 0.350–4.500)

## 2022-03-12 ENCOUNTER — Telehealth: Payer: Self-pay | Admitting: *Deleted

## 2022-03-12 DIAGNOSIS — C779 Secondary and unspecified malignant neoplasm of lymph node, unspecified: Secondary | ICD-10-CM | POA: Diagnosis not present

## 2022-03-12 DIAGNOSIS — C7951 Secondary malignant neoplasm of bone: Secondary | ICD-10-CM | POA: Diagnosis not present

## 2022-03-12 DIAGNOSIS — M84552D Pathological fracture in neoplastic disease, left femur, subsequent encounter for fracture with routine healing: Secondary | ICD-10-CM | POA: Diagnosis not present

## 2022-03-12 DIAGNOSIS — C641 Malignant neoplasm of right kidney, except renal pelvis: Secondary | ICD-10-CM | POA: Diagnosis not present

## 2022-03-12 DIAGNOSIS — G893 Neoplasm related pain (acute) (chronic): Secondary | ICD-10-CM | POA: Diagnosis not present

## 2022-03-12 DIAGNOSIS — C7800 Secondary malignant neoplasm of unspecified lung: Secondary | ICD-10-CM | POA: Diagnosis not present

## 2022-03-12 NOTE — Telephone Encounter (Signed)
Verbal OK to continue wound care for pt 2x weekly for 4 weeks with [facility]

## 2022-03-13 DIAGNOSIS — Z9181 History of falling: Secondary | ICD-10-CM | POA: Diagnosis not present

## 2022-03-13 DIAGNOSIS — C7800 Secondary malignant neoplasm of unspecified lung: Secondary | ICD-10-CM | POA: Diagnosis not present

## 2022-03-13 DIAGNOSIS — Z7985 Long-term (current) use of injectable non-insulin antidiabetic drugs: Secondary | ICD-10-CM | POA: Diagnosis not present

## 2022-03-13 DIAGNOSIS — Z7902 Long term (current) use of antithrombotics/antiplatelets: Secondary | ICD-10-CM | POA: Diagnosis not present

## 2022-03-13 DIAGNOSIS — Z79891 Long term (current) use of opiate analgesic: Secondary | ICD-10-CM | POA: Diagnosis not present

## 2022-03-13 DIAGNOSIS — L89312 Pressure ulcer of right buttock, stage 2: Secondary | ICD-10-CM | POA: Diagnosis not present

## 2022-03-13 DIAGNOSIS — M84552D Pathological fracture in neoplastic disease, left femur, subsequent encounter for fracture with routine healing: Secondary | ICD-10-CM | POA: Diagnosis not present

## 2022-03-13 DIAGNOSIS — D630 Anemia in neoplastic disease: Secondary | ICD-10-CM | POA: Diagnosis not present

## 2022-03-13 DIAGNOSIS — C7951 Secondary malignant neoplasm of bone: Secondary | ICD-10-CM | POA: Diagnosis not present

## 2022-03-13 DIAGNOSIS — I700 Atherosclerosis of aorta: Secondary | ICD-10-CM | POA: Diagnosis not present

## 2022-03-13 DIAGNOSIS — E119 Type 2 diabetes mellitus without complications: Secondary | ICD-10-CM | POA: Diagnosis not present

## 2022-03-13 DIAGNOSIS — C779 Secondary and unspecified malignant neoplasm of lymph node, unspecified: Secondary | ICD-10-CM | POA: Diagnosis not present

## 2022-03-13 DIAGNOSIS — G893 Neoplasm related pain (acute) (chronic): Secondary | ICD-10-CM | POA: Diagnosis not present

## 2022-03-13 DIAGNOSIS — I959 Hypotension, unspecified: Secondary | ICD-10-CM | POA: Diagnosis not present

## 2022-03-13 DIAGNOSIS — L89322 Pressure ulcer of left buttock, stage 2: Secondary | ICD-10-CM | POA: Diagnosis not present

## 2022-03-13 DIAGNOSIS — C641 Malignant neoplasm of right kidney, except renal pelvis: Secondary | ICD-10-CM | POA: Diagnosis not present

## 2022-03-16 DIAGNOSIS — C779 Secondary and unspecified malignant neoplasm of lymph node, unspecified: Secondary | ICD-10-CM | POA: Diagnosis not present

## 2022-03-16 DIAGNOSIS — G893 Neoplasm related pain (acute) (chronic): Secondary | ICD-10-CM | POA: Diagnosis not present

## 2022-03-16 DIAGNOSIS — C7951 Secondary malignant neoplasm of bone: Secondary | ICD-10-CM | POA: Diagnosis not present

## 2022-03-16 DIAGNOSIS — M84552D Pathological fracture in neoplastic disease, left femur, subsequent encounter for fracture with routine healing: Secondary | ICD-10-CM | POA: Diagnosis not present

## 2022-03-16 DIAGNOSIS — C641 Malignant neoplasm of right kidney, except renal pelvis: Secondary | ICD-10-CM | POA: Diagnosis not present

## 2022-03-16 DIAGNOSIS — C7800 Secondary malignant neoplasm of unspecified lung: Secondary | ICD-10-CM | POA: Diagnosis not present

## 2022-03-17 ENCOUNTER — Telehealth: Payer: Self-pay | Admitting: *Deleted

## 2022-03-17 ENCOUNTER — Other Ambulatory Visit: Payer: Self-pay | Admitting: Oncology

## 2022-03-17 DIAGNOSIS — C649 Malignant neoplasm of unspecified kidney, except renal pelvis: Secondary | ICD-10-CM

## 2022-03-17 MED ORDER — HYDROCODONE-ACETAMINOPHEN 10-325 MG PO TABS: 1.0000 | ORAL_TABLET | ORAL | 0 refills | Status: DC | PRN

## 2022-03-17 NOTE — Telephone Encounter (Signed)
PC to patient, informed him Dr Alen Blew has sent in new rx for his hydrocodone, he verbalizes understanding.

## 2022-03-17 NOTE — Telephone Encounter (Signed)
-----   Message from Thomas Portela, MD sent at 03/17/2022 12:23 PM EDT ----- Regarding: RE: Pain med I will send a new RX for every 4 hours.  ----- Message ----- From: Rolene Course, RN Sent: 03/17/2022  12:11 PM EDT To: Thomas Portela, MD Subject: Pain med                                       Thomas Michael called today & has some concerns about his hydrocodone rx.  He says his pain has increased in the last two weeks, he isn't sure why.  He is also very fearful every month of running out of his pain med especially over a weekend or a holiday.  He can take it every 6 hours but starts having significant pain at around 5 hours.  Sometimes he tries taking 1/2 a tablet but that isn't working.  His current rx should last him until 9/8 but he says he will definitely be out before then but says he is only taking it every 6 hours.  He is very concerned about the upcoming Labor Day long weekend.  He wants to know if he can possibly have a new rx to take the oxycodone more frequently.  Please advise.  Thanks, Bethena Roys

## 2022-03-19 ENCOUNTER — Telehealth: Payer: Self-pay | Admitting: *Deleted

## 2022-03-19 DIAGNOSIS — C779 Secondary and unspecified malignant neoplasm of lymph node, unspecified: Secondary | ICD-10-CM | POA: Diagnosis not present

## 2022-03-19 DIAGNOSIS — C641 Malignant neoplasm of right kidney, except renal pelvis: Secondary | ICD-10-CM | POA: Diagnosis not present

## 2022-03-19 DIAGNOSIS — M84552D Pathological fracture in neoplastic disease, left femur, subsequent encounter for fracture with routine healing: Secondary | ICD-10-CM | POA: Diagnosis not present

## 2022-03-19 DIAGNOSIS — G893 Neoplasm related pain (acute) (chronic): Secondary | ICD-10-CM | POA: Diagnosis not present

## 2022-03-19 DIAGNOSIS — C7800 Secondary malignant neoplasm of unspecified lung: Secondary | ICD-10-CM | POA: Diagnosis not present

## 2022-03-19 DIAGNOSIS — C7951 Secondary malignant neoplasm of bone: Secondary | ICD-10-CM | POA: Diagnosis not present

## 2022-03-19 NOTE — Telephone Encounter (Signed)
[facility] orders received for wound care for this patient, signed by Dr. Alen Blew & faxed back [phone].  Fax confirmation received.

## 2022-03-20 ENCOUNTER — Other Ambulatory Visit: Payer: Self-pay

## 2022-03-23 DIAGNOSIS — M84552D Pathological fracture in neoplastic disease, left femur, subsequent encounter for fracture with routine healing: Secondary | ICD-10-CM | POA: Diagnosis not present

## 2022-03-23 DIAGNOSIS — C7951 Secondary malignant neoplasm of bone: Secondary | ICD-10-CM | POA: Diagnosis not present

## 2022-03-23 DIAGNOSIS — C7800 Secondary malignant neoplasm of unspecified lung: Secondary | ICD-10-CM | POA: Diagnosis not present

## 2022-03-23 DIAGNOSIS — C779 Secondary and unspecified malignant neoplasm of lymph node, unspecified: Secondary | ICD-10-CM | POA: Diagnosis not present

## 2022-03-23 DIAGNOSIS — C641 Malignant neoplasm of right kidney, except renal pelvis: Secondary | ICD-10-CM | POA: Diagnosis not present

## 2022-03-23 DIAGNOSIS — G893 Neoplasm related pain (acute) (chronic): Secondary | ICD-10-CM | POA: Diagnosis not present

## 2022-03-25 DIAGNOSIS — E1122 Type 2 diabetes mellitus with diabetic chronic kidney disease: Secondary | ICD-10-CM | POA: Diagnosis not present

## 2022-03-25 DIAGNOSIS — C641 Malignant neoplasm of right kidney, except renal pelvis: Secondary | ICD-10-CM | POA: Diagnosis not present

## 2022-03-25 DIAGNOSIS — U071 COVID-19: Secondary | ICD-10-CM | POA: Diagnosis not present

## 2022-03-29 ENCOUNTER — Telehealth: Payer: Self-pay | Admitting: *Deleted

## 2022-03-29 NOTE — Telephone Encounter (Signed)
Yvone Neu left a message stating he needs to cancel Tuesday's appts. Tested positive for Covid last week.

## 2022-03-30 ENCOUNTER — Inpatient Hospital Stay: Payer: Medicare Other

## 2022-03-30 ENCOUNTER — Inpatient Hospital Stay: Payer: Medicare Other | Admitting: Oncology

## 2022-03-31 DIAGNOSIS — C641 Malignant neoplasm of right kidney, except renal pelvis: Secondary | ICD-10-CM | POA: Diagnosis not present

## 2022-03-31 DIAGNOSIS — M84552D Pathological fracture in neoplastic disease, left femur, subsequent encounter for fracture with routine healing: Secondary | ICD-10-CM | POA: Diagnosis not present

## 2022-03-31 DIAGNOSIS — C7951 Secondary malignant neoplasm of bone: Secondary | ICD-10-CM | POA: Diagnosis not present

## 2022-03-31 DIAGNOSIS — C779 Secondary and unspecified malignant neoplasm of lymph node, unspecified: Secondary | ICD-10-CM | POA: Diagnosis not present

## 2022-03-31 DIAGNOSIS — G893 Neoplasm related pain (acute) (chronic): Secondary | ICD-10-CM | POA: Diagnosis not present

## 2022-03-31 DIAGNOSIS — C7800 Secondary malignant neoplasm of unspecified lung: Secondary | ICD-10-CM | POA: Diagnosis not present

## 2022-04-06 DIAGNOSIS — C641 Malignant neoplasm of right kidney, except renal pelvis: Secondary | ICD-10-CM | POA: Diagnosis not present

## 2022-04-06 DIAGNOSIS — M84552D Pathological fracture in neoplastic disease, left femur, subsequent encounter for fracture with routine healing: Secondary | ICD-10-CM | POA: Diagnosis not present

## 2022-04-06 DIAGNOSIS — C779 Secondary and unspecified malignant neoplasm of lymph node, unspecified: Secondary | ICD-10-CM | POA: Diagnosis not present

## 2022-04-06 DIAGNOSIS — G893 Neoplasm related pain (acute) (chronic): Secondary | ICD-10-CM | POA: Diagnosis not present

## 2022-04-06 DIAGNOSIS — C7951 Secondary malignant neoplasm of bone: Secondary | ICD-10-CM | POA: Diagnosis not present

## 2022-04-06 DIAGNOSIS — C7800 Secondary malignant neoplasm of unspecified lung: Secondary | ICD-10-CM | POA: Diagnosis not present

## 2022-04-12 DIAGNOSIS — M84552D Pathological fracture in neoplastic disease, left femur, subsequent encounter for fracture with routine healing: Secondary | ICD-10-CM | POA: Diagnosis not present

## 2022-04-12 DIAGNOSIS — Z7985 Long-term (current) use of injectable non-insulin antidiabetic drugs: Secondary | ICD-10-CM | POA: Diagnosis not present

## 2022-04-12 DIAGNOSIS — Z79891 Long term (current) use of opiate analgesic: Secondary | ICD-10-CM | POA: Diagnosis not present

## 2022-04-12 DIAGNOSIS — D630 Anemia in neoplastic disease: Secondary | ICD-10-CM | POA: Diagnosis not present

## 2022-04-12 DIAGNOSIS — L89312 Pressure ulcer of right buttock, stage 2: Secondary | ICD-10-CM | POA: Diagnosis not present

## 2022-04-12 DIAGNOSIS — C779 Secondary and unspecified malignant neoplasm of lymph node, unspecified: Secondary | ICD-10-CM | POA: Diagnosis not present

## 2022-04-12 DIAGNOSIS — L89322 Pressure ulcer of left buttock, stage 2: Secondary | ICD-10-CM | POA: Diagnosis not present

## 2022-04-12 DIAGNOSIS — Z7902 Long term (current) use of antithrombotics/antiplatelets: Secondary | ICD-10-CM | POA: Diagnosis not present

## 2022-04-12 DIAGNOSIS — E119 Type 2 diabetes mellitus without complications: Secondary | ICD-10-CM | POA: Diagnosis not present

## 2022-04-12 DIAGNOSIS — I959 Hypotension, unspecified: Secondary | ICD-10-CM | POA: Diagnosis not present

## 2022-04-12 DIAGNOSIS — I700 Atherosclerosis of aorta: Secondary | ICD-10-CM | POA: Diagnosis not present

## 2022-04-12 DIAGNOSIS — C7951 Secondary malignant neoplasm of bone: Secondary | ICD-10-CM | POA: Diagnosis not present

## 2022-04-12 DIAGNOSIS — G893 Neoplasm related pain (acute) (chronic): Secondary | ICD-10-CM | POA: Diagnosis not present

## 2022-04-12 DIAGNOSIS — Z9181 History of falling: Secondary | ICD-10-CM | POA: Diagnosis not present

## 2022-04-12 DIAGNOSIS — C7800 Secondary malignant neoplasm of unspecified lung: Secondary | ICD-10-CM | POA: Diagnosis not present

## 2022-04-12 DIAGNOSIS — C641 Malignant neoplasm of right kidney, except renal pelvis: Secondary | ICD-10-CM | POA: Diagnosis not present

## 2022-04-14 DIAGNOSIS — M84552D Pathological fracture in neoplastic disease, left femur, subsequent encounter for fracture with routine healing: Secondary | ICD-10-CM | POA: Diagnosis not present

## 2022-04-14 DIAGNOSIS — C641 Malignant neoplasm of right kidney, except renal pelvis: Secondary | ICD-10-CM | POA: Diagnosis not present

## 2022-04-14 DIAGNOSIS — C779 Secondary and unspecified malignant neoplasm of lymph node, unspecified: Secondary | ICD-10-CM | POA: Diagnosis not present

## 2022-04-14 DIAGNOSIS — G893 Neoplasm related pain (acute) (chronic): Secondary | ICD-10-CM | POA: Diagnosis not present

## 2022-04-14 DIAGNOSIS — C7951 Secondary malignant neoplasm of bone: Secondary | ICD-10-CM | POA: Diagnosis not present

## 2022-04-14 DIAGNOSIS — C7800 Secondary malignant neoplasm of unspecified lung: Secondary | ICD-10-CM | POA: Diagnosis not present

## 2022-04-15 ENCOUNTER — Other Ambulatory Visit: Payer: Self-pay | Admitting: Oncology

## 2022-04-15 ENCOUNTER — Telehealth: Payer: Self-pay

## 2022-04-15 DIAGNOSIS — C649 Malignant neoplasm of unspecified kidney, except renal pelvis: Secondary | ICD-10-CM

## 2022-04-15 MED ORDER — HYDROCODONE-ACETAMINOPHEN 5-325 MG PO TABS: 2.0000 | ORAL_TABLET | ORAL | 0 refills | Status: DC | PRN

## 2022-04-15 MED ORDER — HYDROCODONE-ACETAMINOPHEN 10-325 MG PO TABS: 1.0000 | ORAL_TABLET | ORAL | 0 refills | Status: DC | PRN

## 2022-04-15 NOTE — Telephone Encounter (Signed)
T/C from pt stating [facility] is out of the 10-325 mg of hydrocodone-acetaminophen and it will be several months before they have any in stock.  They do have the 5-325 mg and  he is asking if we can re send his prescription.  Confirmed with [facility] that they do have enough of the 5 mg for a 30 day supply.

## 2022-04-15 NOTE — Telephone Encounter (Signed)
T/C from pt requesting a refill on his hydrocodone-acetaminophen 10-325 mg to be sent to [facility].

## 2022-04-16 ENCOUNTER — Telehealth: Payer: Self-pay

## 2022-04-16 ENCOUNTER — Telehealth: Payer: Self-pay | Admitting: *Deleted

## 2022-04-16 NOTE — Telephone Encounter (Signed)
Patient notified of prior authorization approval for Hydrocodone 5/'325mg'$  tablets quantity of 360 per 30 days. Authorization is approved until further notice. No other needs or concerns voiced at this time.

## 2022-04-16 NOTE — Telephone Encounter (Signed)
Spoke with pharmacist at [facility]. Medicare will not approve Hydrocodone 5-325 for more than 8 tablets per day. Needs to be changed to 2 tablets every 6 hours. I spoke with Yvone Neu and he says that will work for his pain.

## 2022-04-16 NOTE — Telephone Encounter (Signed)
Notified Yvone Neu that we got approval for Oxycodone 5-325 2 tablets every 4 hours as needed

## 2022-04-20 ENCOUNTER — Other Ambulatory Visit: Payer: Self-pay

## 2022-04-20 ENCOUNTER — Inpatient Hospital Stay: Payer: Medicare Other

## 2022-04-20 ENCOUNTER — Inpatient Hospital Stay (HOSPITAL_BASED_OUTPATIENT_CLINIC_OR_DEPARTMENT_OTHER): Payer: Medicare Other | Admitting: Oncology

## 2022-04-20 ENCOUNTER — Inpatient Hospital Stay: Payer: Medicare Other | Attending: Physician Assistant

## 2022-04-20 VITALS — BP 121/56 | HR 69 | Resp 18

## 2022-04-20 VITALS — BP 104/50 | HR 78 | Temp 97.7°F | Resp 18 | Ht 66.0 in | Wt 200.7 lb

## 2022-04-20 DIAGNOSIS — D630 Anemia in neoplastic disease: Secondary | ICD-10-CM | POA: Insufficient documentation

## 2022-04-20 DIAGNOSIS — C649 Malignant neoplasm of unspecified kidney, except renal pelvis: Secondary | ICD-10-CM | POA: Diagnosis not present

## 2022-04-20 DIAGNOSIS — G893 Neoplasm related pain (acute) (chronic): Secondary | ICD-10-CM | POA: Diagnosis not present

## 2022-04-20 DIAGNOSIS — Z79899 Other long term (current) drug therapy: Secondary | ICD-10-CM | POA: Diagnosis not present

## 2022-04-20 DIAGNOSIS — N2889 Other specified disorders of kidney and ureter: Secondary | ICD-10-CM | POA: Diagnosis not present

## 2022-04-20 DIAGNOSIS — C7951 Secondary malignant neoplasm of bone: Secondary | ICD-10-CM

## 2022-04-20 DIAGNOSIS — E611 Iron deficiency: Secondary | ICD-10-CM | POA: Diagnosis not present

## 2022-04-20 DIAGNOSIS — Z905 Acquired absence of kidney: Secondary | ICD-10-CM | POA: Diagnosis not present

## 2022-04-20 DIAGNOSIS — C779 Secondary and unspecified malignant neoplasm of lymph node, unspecified: Secondary | ICD-10-CM | POA: Diagnosis not present

## 2022-04-20 DIAGNOSIS — R918 Other nonspecific abnormal finding of lung field: Secondary | ICD-10-CM

## 2022-04-20 DIAGNOSIS — Z85528 Personal history of other malignant neoplasm of kidney: Secondary | ICD-10-CM | POA: Diagnosis not present

## 2022-04-20 DIAGNOSIS — C7800 Secondary malignant neoplasm of unspecified lung: Secondary | ICD-10-CM | POA: Insufficient documentation

## 2022-04-20 DIAGNOSIS — Z5112 Encounter for antineoplastic immunotherapy: Secondary | ICD-10-CM | POA: Insufficient documentation

## 2022-04-20 DIAGNOSIS — D509 Iron deficiency anemia, unspecified: Secondary | ICD-10-CM | POA: Insufficient documentation

## 2022-04-20 DIAGNOSIS — D508 Other iron deficiency anemias: Secondary | ICD-10-CM

## 2022-04-20 LAB — IRON AND IRON BINDING CAPACITY (CC-WL,HP ONLY)
Iron: <5 ug/dL — ABNORMAL LOW (ref 45–182)
Saturation Ratios: 1 % — ABNORMAL LOW (ref 17.9–39.5)
TIBC: 294 ug/dL (ref 250–450)
UIBC: UNDETERMINED ug/dL (ref 117–376)

## 2022-04-20 LAB — CMP (CANCER CENTER ONLY)
ALT: 18 U/L (ref 0–44)
AST: 17 U/L (ref 15–41)
Albumin: 3.2 g/dL — ABNORMAL LOW (ref 3.5–5.0)
Alkaline Phosphatase: 81 U/L (ref 38–126)
Anion gap: 7 (ref 5–15)
BUN: 12 mg/dL (ref 8–23)
CO2: 25 mmol/L (ref 22–32)
Calcium: 8.9 mg/dL (ref 8.9–10.3)
Chloride: 99 mmol/L (ref 98–111)
Creatinine: 0.98 mg/dL (ref 0.61–1.24)
GFR, Estimated: >60 mL/min (ref >60)
Glucose, Bld: 132 mg/dL — ABNORMAL HIGH (ref 70–99)
Potassium: 4.2 mmol/L (ref 3.5–5.1)
Sodium: 131 mmol/L — ABNORMAL LOW (ref 135–145)
Total Bilirubin: 0.4 mg/dL (ref 0.3–1.2)
Total Protein: 6.9 g/dL (ref 6.5–8.1)

## 2022-04-20 LAB — CBC WITH DIFFERENTIAL (CANCER CENTER ONLY)
Abs Immature Granulocytes: 0.04 10*3/uL (ref 0.00–0.07)
Basophils Absolute: 0.1 10*3/uL (ref 0.0–0.1)
Basophils Relative: 1 %
Eosinophils Absolute: 0.3 10*3/uL (ref 0.0–0.5)
Eosinophils Relative: 3 %
HCT: 20 % — ABNORMAL LOW (ref 39.0–52.0)
Hemoglobin: 6.3 g/dL — CL (ref 13.0–17.0)
Immature Granulocytes: 0 %
Lymphocytes Relative: 9 %
Lymphs Abs: 0.8 10*3/uL (ref 0.7–4.0)
MCH: 23.4 pg — ABNORMAL LOW (ref 26.0–34.0)
MCHC: 31.5 g/dL (ref 30.0–36.0)
MCV: 74.3 fL — ABNORMAL LOW (ref 80.0–100.0)
Monocytes Absolute: 0.9 10*3/uL (ref 0.1–1.0)
Monocytes Relative: 9 %
Neutro Abs: 7.5 10*3/uL (ref 1.7–7.7)
Neutrophils Relative %: 78 %
Platelet Count: 325 10*3/uL (ref 150–400)
RBC: 2.69 MIL/uL — ABNORMAL LOW (ref 4.22–5.81)
RDW: 19.9 % — ABNORMAL HIGH (ref 11.5–15.5)
WBC Count: 9.6 10*3/uL (ref 4.0–10.5)
nRBC: 0 % (ref 0.0–0.2)

## 2022-04-20 LAB — TSH: TSH: 13.871 u[IU]/mL — ABNORMAL HIGH (ref 0.350–4.500)

## 2022-04-20 LAB — FERRITIN: Ferritin: 27 ng/mL (ref 24–336)

## 2022-04-20 MED ORDER — SODIUM CHLORIDE 0.9 % IV SOLN
200.0000 mg | Freq: Once | INTRAVENOUS | Status: AC
  Administered 2022-04-20: 200 mg via INTRAVENOUS
  Filled 2022-04-20: qty 200

## 2022-04-20 MED ORDER — SODIUM CHLORIDE 0.9 % IV SOLN: Freq: Once | INTRAVENOUS | Status: AC

## 2022-04-20 NOTE — Patient Instructions (Signed)
[facility] MEDICAL [facility]COLOG[facility]  Discharge Instructions: Thank you for choosing [facility] to provide your oncology and hematology care.   If you have a lab appointment with the Cancer Center, please go directly to the Cancer Center and check in at the registration area.   Wear comfortable clothing and clothing appropriate for easy access to any Portacath or PICC line.   We strive to give you quality time with your provider. You may need to reschedule your appointment if you arrive late (15 or more minutes).  Arriving late affects you and other patients whose appointments are after yours.  Also, if you miss three or more appointments without notifying the office, you may be dismissed from the clinic at the provider's discretion.      For prescription refill requests, have your pharmacy contact our office and allow 72 hours for refills to be completed.    Today you received the following chemotherapy and/or immunotherapy agents: Keytruda.       To help prevent nausea and vomiting after your treatment, we encourage you to take your nausea medication as directed.  BELOW ARE SYMPTOMS THAT SHOULD BE REPORTED IMMEDIATELY: *FEVER GREATER THAN 100.4 F (38 C) OR HIGHER *CHILLS OR SWEATING *NAUSEA AND VOMITING THAT IS NOT CONTROLLED WITH YOUR NAUSEA MEDICATION *UNUSUAL SHORTNESS OF BREATH *UNUSUAL BRUISING OR BLEEDING *URINARY PROBLEMS (pain or burning when urinating, or frequent urination) *BOWEL PROBLEMS (unusual diarrhea, constipation, pain near the anus) TENDERNESS IN MOUTH AND THROAT WITH OR WITHOUT PRESENCE OF ULCERS (sore throat, sores in mouth, or a toothache) UNUSUAL RASH, SWELLING OR PAIN  UNUSUAL VAGINAL DISCHARGE OR ITCHING   Items with * indicate a potential emergency and should be followed up as soon as possible or go to the Emergency Department if any problems should occur.  Please show the CHEMOTHERAPY ALERT CARD or IMMUNOTHERAPY ALERT CARD at check-in to  the Emergency Department and triage nurse.  Should you have questions after your visit or need to cancel or reschedule your appointment, please contact [facility]COL[facility]  Dept: [phone]  and follow the prompts.  Office hours are 8:00 a.m. to 4:30 p.m. Monday - Friday. Please note that voicemails left after 4:00 p.m. may not be returned until the following business day.  We are closed weekends and major holidays. You have access to a nurse at all times for urgent questions. Please call the main number to the clinic Dept: [phone] and follow the prompts.   For any non-urgent questions, you may also contact your provider using MyChart. We now offer e-Visits for anyone 18 and older to request care online for non-urgent symptoms. For details visit [email or web address].   Also download the MyChart app! Go to the app store, search "MyChart", open the app, select [facility], and log in with your MyChart username and password.  Masks are optional in the cancer centers. If you would like for your care team to wear a mask while they are taking care of you, please let them know. You may have one support person who is at least 80 years old accompany you for your appointments. 

## 2022-04-20 NOTE — Progress Notes (Signed)
Hematology and Oncology Follow Up   Thomas Michael [id number] 09-12-1940 81 y.o. 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     Prior Therapy:   He is status post right radical nephrectomy in May 2012.  He was found to have stage Ib tumor.  He is status post open treatment of impending pathological fracture of the left proximal femur with intramedullary implant completed on October 01, 2021.   Current therapy: Pembrolizumab 200 mg every 3 weeks started on September 08, 2021.  He is here for cycle 10 of therapy.   Interim History: [name] returns today for a follow-up.  Since last visit, he reports no major complaints.  He denies any excessive fatigue or tiredness.  He denies any weakness or dyspnea on exertion.  He did have episode of COVID exposure although never tested positive.  He did Paxlovid prophylactically.  He denies any abdominal pain or discomfort.  His appetite went down slightly but has improved at this time.     Medications: Reviewed without changes. Current Outpatient Medications  Medication Sig Dispense Refill   Accu-Chek FastClix Lancets MISC check CBG     acetaminophen (TYLENOL) 500 MG tablet Take 1,000 mg by mouth every 8 (eight) hours as needed for moderate pain.     axitinib (INLYTA) 5 MG tablet Take 1 tablet (5 mg total) by mouth daily. 30 tablet 1   Blood Glucose Monitoring Suppl (ACCU-CHEK AVIVA PLUS) w/Device KIT check CBG     chlorpheniramine-HYDROcodone 10-8 MG/5ML Take 5 mLs by mouth at bedtime as needed for cough. 473 mL 0   Cholecalciferol 50 MCG (2000 UT) TABS Take 2,000 Units by mouth daily at 12 noon.     ciclopirox (PENLAC) 8 % solution Apply 1 application topically daily as needed (fungus (toes)). Apply over nail and surrounding skin. Apply daily as needed over previous coat.      clopidogrel (PLAVIX) 75 MG tablet Take 75 mg by mouth at bedtime.     diphenhydrAMINE (BENADRYL) 25 MG tablet Take 50 mg by mouth at bedtime.     docusate sodium (COLACE) 100 MG capsule Take 200 mg by mouth daily as needed for mild constipation.     ferrous sulfate 325 (65 FE) MG tablet Take 1 tablet (325 mg total) by mouth 2 (two) times daily. 60 tablet 3   guaiFENesin (MUCINEX) 600 MG 12 hr tablet Take 1,200 mg by mouth 2 (two) times daily as needed for to loosen phlegm or cough.     HYDROcodone-acetaminophen (NORCO) 5-325 MG tablet Take 2 tablets by mouth every 4 (four) hours as needed for moderate pain. 360 tablet 0   levothyroxine (SYNTHROID, LEVOTHROID) 200 MCG tablet Take 200 mcg by mouth every evening.     magic mouthwash w/lidocaine SOLN Take 5 mLs by mouth 4 (four) times daily. 5 ml QID PRN mouth sores, swish and spit 240 mL 0   metoprolol (TOPROL-XL) 50 MG 24 hr tablet Take 50 mg by mouth in the morning.     Multiple Vitamin (MULTIVITAMIN WITH MINERALS) TABS tablet Take 1 tablet by mouth daily at 12 noon.     niacin 500 MG CR capsule Take 500 mg by mouth daily at 12 noon.     nystatin-triamcinolone ointment (MYCOLOG) Apply 1 application. topically 2 (two) times daily. 30 g 3   Omega-3 Fatty  Acids (FISH OIL PO) Take 1,000 mg by mouth daily at 12 noon.     polyethylene glycol (MIRALAX MIX-IN PAX) 17 g packet Take 17 g by mouth daily. 14 each 0   prochlorperazine (COMPAZINE) 10 MG tablet Take 1 tablet (10 mg total) by mouth every 6 (six) hours as needed for nausea or vomiting. 30 tablet 0   rOPINIRole (REQUIP) 0.25 MG tablet Take 0.25 mg by mouth 2 (two) times daily.     rosuvastatin (CRESTOR) 20 MG tablet Take 20 mg by mouth in the morning.     senna (SENOKOT) 8.6 MG TABS tablet Take 1 tablet (8.6 mg total) by mouth 2 (two) times daily. 60 tablet 0   trazodone (DESYREL) 300 MG tablet Take 300 mg by mouth at bedtime.     valsartan-hydrochlorothiazide (DIOVAN-HCT) 160-12.5 MG per tablet  Take 1 tablet by mouth in the morning.     VICTOZA 18 MG/3ML SOPN Inject 1.2 mg into the skin every evening.     vitamin E 400 UNIT capsule Take 400 Units by mouth daily at 12 noon.     No current facility-administered medications for this visit.     Allergies:  Allergies  Allergen Reactions   Oxycodone     Other reaction(s): vomiting   Promethazine-Codeine Other (See Comments)    Pt does not remember reaction   Physical exam     Blood pressure (!) 104/50, pulse 78, temperature 97.7 F (36.5 C), temperature source Temporal, resp. rate 18, height $RemoveBe'5\' 6"'mAVgXdgLD$  (1.676 m), weight 200 lb 11.2 oz (91 kg), SpO2 97 %.     ECOG 1   General appearance: Alert, awake without any distress. Head: Atraumatic without abnormalities Oropharynx: Without any thrush or ulcers. Eyes: No scleral icterus. Lymph nodes: No lymphadenopathy noted in the cervical, supraclavicular, or axillary nodes Heart:regular rate and rhythm, without any murmurs or gallops.   Lung: Clear to auscultation without any rhonchi, wheezes or dullness to percussion. Abdomin: Soft, nontender without any shifting dullness or ascites. Musculoskeletal: No clubbing or cyanosis. Neurological: No motor or sensory deficits. Skin: No rashes or lesions.        Lab Results: Lab Results  Component Value Date   WBC 6.8 03/09/2022   HGB 7.9 (L) 03/09/2022   HCT 25.0 (L) 03/09/2022   MCV 72.5 (L) 03/09/2022   PLT 347 03/09/2022     Chemistry      Component Value Date/Time   NA 130 (L) 03/09/2022 1416   K 3.8 03/09/2022 1416   CL 95 (L) 03/09/2022 1416   CO2 29 03/09/2022 1416   BUN 15 03/09/2022 1416   CREATININE 1.22 03/09/2022 1416      Component Value Date/Time   CALCIUM 9.7 03/09/2022 1416   ALKPHOS 79 03/09/2022 1416   AST 15 03/09/2022 1416   ALT 15 03/09/2022 1416   BILITOT 0.4 03/09/2022 1416      I     Impression and Plan:  81 year old with:  1. Stage IV intermediate risk clear-cell renal cell  carcinoma with bone, pulmonary and lymph node involvement diagnosed in 2023.    His disease status was updated at this time and treatment choices were reviewed.  Risks and benefits of continuing the current treatment were discussed.  Alternative treatment choices including combination immunotherapy or adding axitinib to Pembrolizumab.  He is agreeable to proceed with treatment today and I recommended updating staging scan before the next visit.    2.  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   3.  Chronic pain: Related to metastatic cancer into the bone.   4.  Immune mediated complications: He has not experienced any complications.  These include pneumonitis, colitis and thyroid disease.   5.  Anemia: Related to iron deficiency as well as malignancy.  His hemoglobin is down at this time although he is mildly symptomatic.  We have discussed packed red cell transfusion versus iron infusion and he is agreeable to proceed with IV iron.  Risks and benefits of Venofer were discussed.  These complications include arthralgias, myalgias and rarely anaphylaxis.  6.  Weight loss: Likely related to his recent illness and improved at this time.  We will continue to monitor on subsequent cycles.   7.  Follow-up: In 3 weeks for the next cycle of treatment.   30  minutes were dedicated to this encounter.  The time was spent on reviewing his laboratory data, treatment choices and outlining future plan of care review.   Zola Button, MD 04/20/2022 11:58 AM

## 2022-04-20 NOTE — Progress Notes (Signed)
CRITICAL VALUE STICKER  CRITICAL VALUE: Hgb 6.3  MD NOTIFIED: Dr Alen Blew   TIME OF NOTIFICATION: 1200  RESPONSE:  Dr Alen Blew to see patient

## 2022-04-20 NOTE — Progress Notes (Signed)
Per Alen Blew MD, ok to treat with HGB 6.3 today.

## 2022-04-21 ENCOUNTER — Other Ambulatory Visit: Payer: Self-pay

## 2022-04-21 DIAGNOSIS — C779 Secondary and unspecified malignant neoplasm of lymph node, unspecified: Secondary | ICD-10-CM | POA: Diagnosis not present

## 2022-04-21 DIAGNOSIS — C7951 Secondary malignant neoplasm of bone: Secondary | ICD-10-CM | POA: Diagnosis not present

## 2022-04-21 DIAGNOSIS — G893 Neoplasm related pain (acute) (chronic): Secondary | ICD-10-CM | POA: Diagnosis not present

## 2022-04-21 DIAGNOSIS — C7800 Secondary malignant neoplasm of unspecified lung: Secondary | ICD-10-CM | POA: Diagnosis not present

## 2022-04-21 DIAGNOSIS — C641 Malignant neoplasm of right kidney, except renal pelvis: Secondary | ICD-10-CM | POA: Diagnosis not present

## 2022-04-21 DIAGNOSIS — M84552D Pathological fracture in neoplastic disease, left femur, subsequent encounter for fracture with routine healing: Secondary | ICD-10-CM | POA: Diagnosis not present

## 2022-04-22 LAB — T4: T4, Total: 5.3 ug/dL (ref 4.5–12.0)

## 2022-04-23 ENCOUNTER — Telehealth: Payer: Self-pay | Admitting: Oncology

## 2022-04-23 NOTE — Telephone Encounter (Signed)
Scheduled per 10/03 los, patient has been called and notified.

## 2022-04-27 DIAGNOSIS — C7800 Secondary malignant neoplasm of unspecified lung: Secondary | ICD-10-CM | POA: Diagnosis not present

## 2022-04-27 DIAGNOSIS — G893 Neoplasm related pain (acute) (chronic): Secondary | ICD-10-CM | POA: Diagnosis not present

## 2022-04-27 DIAGNOSIS — C641 Malignant neoplasm of right kidney, except renal pelvis: Secondary | ICD-10-CM | POA: Diagnosis not present

## 2022-04-27 DIAGNOSIS — C779 Secondary and unspecified malignant neoplasm of lymph node, unspecified: Secondary | ICD-10-CM | POA: Diagnosis not present

## 2022-04-27 DIAGNOSIS — C7951 Secondary malignant neoplasm of bone: Secondary | ICD-10-CM | POA: Diagnosis not present

## 2022-04-27 DIAGNOSIS — M84552D Pathological fracture in neoplastic disease, left femur, subsequent encounter for fracture with routine healing: Secondary | ICD-10-CM | POA: Diagnosis not present

## 2022-04-30 ENCOUNTER — Other Ambulatory Visit: Payer: Self-pay

## 2022-04-30 ENCOUNTER — Inpatient Hospital Stay: Payer: Medicare Other

## 2022-04-30 VITALS — BP 122/61 | HR 73 | Temp 99.2°F | Resp 18 | Ht 66.0 in | Wt 204.8 lb

## 2022-04-30 DIAGNOSIS — C7951 Secondary malignant neoplasm of bone: Secondary | ICD-10-CM | POA: Diagnosis not present

## 2022-04-30 DIAGNOSIS — Z5112 Encounter for antineoplastic immunotherapy: Secondary | ICD-10-CM | POA: Diagnosis not present

## 2022-04-30 DIAGNOSIS — D508 Other iron deficiency anemias: Secondary | ICD-10-CM

## 2022-04-30 DIAGNOSIS — C779 Secondary and unspecified malignant neoplasm of lymph node, unspecified: Secondary | ICD-10-CM | POA: Diagnosis not present

## 2022-04-30 DIAGNOSIS — C7800 Secondary malignant neoplasm of unspecified lung: Secondary | ICD-10-CM | POA: Diagnosis not present

## 2022-04-30 DIAGNOSIS — Z85528 Personal history of other malignant neoplasm of kidney: Secondary | ICD-10-CM | POA: Diagnosis not present

## 2022-04-30 DIAGNOSIS — G893 Neoplasm related pain (acute) (chronic): Secondary | ICD-10-CM | POA: Diagnosis not present

## 2022-04-30 MED ORDER — SODIUM CHLORIDE 0.9 % IV SOLN
200.0000 mg | Freq: Once | INTRAVENOUS | Status: AC
  Administered 2022-04-30: 200 mg via INTRAVENOUS
  Filled 2022-04-30: qty 200

## 2022-04-30 MED ORDER — SODIUM CHLORIDE 0.9 % IV SOLN: Freq: Once | INTRAVENOUS | Status: AC

## 2022-04-30 NOTE — Progress Notes (Signed)
Patient monitored for 30 minutes post venofer infusion. VSS and patient ambulatory at discharge.

## 2022-04-30 NOTE — Patient Instructions (Signed)

## 2022-05-03 DIAGNOSIS — G893 Neoplasm related pain (acute) (chronic): Secondary | ICD-10-CM | POA: Diagnosis not present

## 2022-05-03 DIAGNOSIS — C779 Secondary and unspecified malignant neoplasm of lymph node, unspecified: Secondary | ICD-10-CM | POA: Diagnosis not present

## 2022-05-03 DIAGNOSIS — M84552D Pathological fracture in neoplastic disease, left femur, subsequent encounter for fracture with routine healing: Secondary | ICD-10-CM | POA: Diagnosis not present

## 2022-05-03 DIAGNOSIS — C7951 Secondary malignant neoplasm of bone: Secondary | ICD-10-CM | POA: Diagnosis not present

## 2022-05-03 DIAGNOSIS — C641 Malignant neoplasm of right kidney, except renal pelvis: Secondary | ICD-10-CM | POA: Diagnosis not present

## 2022-05-03 DIAGNOSIS — C7800 Secondary malignant neoplasm of unspecified lung: Secondary | ICD-10-CM | POA: Diagnosis not present

## 2022-05-04 ENCOUNTER — Telehealth: Payer: Self-pay | Admitting: *Deleted

## 2022-05-04 ENCOUNTER — Ambulatory Visit (HOSPITAL_COMMUNITY)
Admission: RE | Admit: 2022-05-04 | Discharge: 2022-05-04 | Disposition: A | Discharge status: Home / Self Care | Payer: Medicare Other | Source: Ambulatory Visit | Attending: Oncology | Admitting: Oncology

## 2022-05-04 DIAGNOSIS — N2889 Other specified disorders of kidney and ureter: Secondary | ICD-10-CM | POA: Insufficient documentation

## 2022-05-04 DIAGNOSIS — C7951 Secondary malignant neoplasm of bone: Secondary | ICD-10-CM | POA: Insufficient documentation

## 2022-05-04 DIAGNOSIS — R918 Other nonspecific abnormal finding of lung field: Secondary | ICD-10-CM | POA: Diagnosis not present

## 2022-05-04 DIAGNOSIS — C649 Malignant neoplasm of unspecified kidney, except renal pelvis: Secondary | ICD-10-CM | POA: Insufficient documentation

## 2022-05-04 MED ORDER — IOHEXOL 300 MG/ML  SOLN
100.0000 mL | Freq: Once | INTRAMUSCULAR | Status: AC | PRN
  Administered 2022-05-04: 100 mL via INTRAVENOUS

## 2022-05-04 NOTE — Telephone Encounter (Signed)
Home health orders signed by Dr Alen Blew, returned by fax to [facility], fax confirmation received.

## 2022-05-05 ENCOUNTER — Other Ambulatory Visit: Payer: Self-pay | Admitting: Oncology

## 2022-05-05 MED ORDER — LORAZEPAM 1 MG PO TABS: 1.0000 mg | ORAL_TABLET | Freq: Three times a day (TID) | ORAL | 0 refills | Status: DC

## 2022-05-06 ENCOUNTER — Telehealth: Payer: Self-pay | Admitting: *Deleted

## 2022-05-06 NOTE — Telephone Encounter (Signed)
Home health orders signed by Dr Alen Blew, faxed to [facility], fax confirmation received.

## 2022-05-07 ENCOUNTER — Inpatient Hospital Stay: Payer: Medicare Other

## 2022-05-07 ENCOUNTER — Other Ambulatory Visit: Payer: Self-pay

## 2022-05-07 ENCOUNTER — Telehealth: Payer: Self-pay

## 2022-05-07 NOTE — Telephone Encounter (Signed)
Pt presented to the office. His sister spoke for him advising that pt seems different and increasingly easily agitated and aggressive at times. She states she wonders if he has brain mets. She states the pt is depressed and sometimes states he "wishes someone would give him something to take him away". She states he cannot care for himself and is mostly alone at this point. He is unable to adequately bathe himself and pt wants to know if he should be taken to [facility] place.  I then asked the pt directly if he is requesting to be placed on hospice or if he wants to speak with Dr. Alen Blew about this option at his 05/11/22 office visit. Pt verbalized to me that he wants to think about things over the weekend, then discuss it with Dr. Alen Blew about this directly.

## 2022-05-10 ENCOUNTER — Telehealth: Payer: Self-pay | Admitting: *Deleted

## 2022-05-10 DIAGNOSIS — G893 Neoplasm related pain (acute) (chronic): Secondary | ICD-10-CM | POA: Diagnosis not present

## 2022-05-10 DIAGNOSIS — C779 Secondary and unspecified malignant neoplasm of lymph node, unspecified: Secondary | ICD-10-CM | POA: Diagnosis not present

## 2022-05-10 DIAGNOSIS — C641 Malignant neoplasm of right kidney, except renal pelvis: Secondary | ICD-10-CM | POA: Diagnosis not present

## 2022-05-10 DIAGNOSIS — C7951 Secondary malignant neoplasm of bone: Secondary | ICD-10-CM | POA: Diagnosis not present

## 2022-05-10 DIAGNOSIS — C7800 Secondary malignant neoplasm of unspecified lung: Secondary | ICD-10-CM | POA: Diagnosis not present

## 2022-05-10 DIAGNOSIS — M84552D Pathological fracture in neoplastic disease, left femur, subsequent encounter for fracture with routine healing: Secondary | ICD-10-CM | POA: Diagnosis not present

## 2022-05-10 NOTE — Telephone Encounter (Signed)
Received PC from patient, he is C/O weakness, feels he is dehydrated, states he is unable to walk due to weakness, can only stand.  He also states he fell two nights ago but doesn't think he is injured.  He denies any vomiting or diarrhea, appetite has been decreased. He feels he needs to come in for IV fluids but would need ambulance transportation due to not being able to walk.  Anda Kraft PA informed, she feels patient needs higher level of care than [facility], advises that patient go to ED.  Returned PC to patient, informed him of PA's advice, patient has MD appointment here tomorrow, he states he is trying to drink fluids & thinks he can wait for his appointment to be seen.  Instructed patient to call 911 for transport to ED if his weakness becomes worse, if he is unable to hold down fluids, if he has a fever, or if his condition deteriorates in any way.  Patient verbalizes understanding.

## 2022-05-11 ENCOUNTER — Inpatient Hospital Stay (HOSPITAL_BASED_OUTPATIENT_CLINIC_OR_DEPARTMENT_OTHER): Payer: Medicare Other | Admitting: Oncology

## 2022-05-11 ENCOUNTER — Inpatient Hospital Stay: Payer: Medicare Other

## 2022-05-11 ENCOUNTER — Other Ambulatory Visit: Payer: Self-pay

## 2022-05-11 VITALS — BP 110/55 | HR 80 | Temp 97.8°F | Resp 18 | Ht 66.0 in | Wt 200.7 lb

## 2022-05-11 VITALS — BP 100/80 | HR 70 | Resp 18

## 2022-05-11 DIAGNOSIS — C7800 Secondary malignant neoplasm of unspecified lung: Secondary | ICD-10-CM | POA: Diagnosis not present

## 2022-05-11 DIAGNOSIS — C649 Malignant neoplasm of unspecified kidney, except renal pelvis: Secondary | ICD-10-CM

## 2022-05-11 DIAGNOSIS — D508 Other iron deficiency anemias: Secondary | ICD-10-CM

## 2022-05-11 DIAGNOSIS — C7951 Secondary malignant neoplasm of bone: Secondary | ICD-10-CM | POA: Diagnosis not present

## 2022-05-11 DIAGNOSIS — C779 Secondary and unspecified malignant neoplasm of lymph node, unspecified: Secondary | ICD-10-CM | POA: Diagnosis not present

## 2022-05-11 DIAGNOSIS — Z5112 Encounter for antineoplastic immunotherapy: Secondary | ICD-10-CM | POA: Diagnosis not present

## 2022-05-11 DIAGNOSIS — G893 Neoplasm related pain (acute) (chronic): Secondary | ICD-10-CM | POA: Diagnosis not present

## 2022-05-11 DIAGNOSIS — Z85528 Personal history of other malignant neoplasm of kidney: Secondary | ICD-10-CM | POA: Diagnosis not present

## 2022-05-11 LAB — CMP (CANCER CENTER ONLY)
ALT: 19 U/L (ref 0–44)
AST: 18 U/L (ref 15–41)
Albumin: 3.2 g/dL — ABNORMAL LOW (ref 3.5–5.0)
Alkaline Phosphatase: 88 U/L (ref 38–126)
Anion gap: 10 (ref 5–15)
BUN: 13 mg/dL (ref 8–23)
CO2: 26 mmol/L (ref 22–32)
Calcium: 9.5 mg/dL (ref 8.9–10.3)
Chloride: 96 mmol/L — ABNORMAL LOW (ref 98–111)
Creatinine: 1.15 mg/dL (ref 0.61–1.24)
GFR, Estimated: >60 mL/min (ref >60)
Glucose, Bld: 168 mg/dL — ABNORMAL HIGH (ref 70–99)
Potassium: 3.8 mmol/L (ref 3.5–5.1)
Sodium: 132 mmol/L — ABNORMAL LOW (ref 135–145)
Total Bilirubin: 0.5 mg/dL (ref 0.3–1.2)
Total Protein: 7.6 g/dL (ref 6.5–8.1)

## 2022-05-11 LAB — CBC WITH DIFFERENTIAL (CANCER CENTER ONLY)
Abs Immature Granulocytes: 0.05 10*3/uL (ref 0.00–0.07)
Basophils Absolute: 0.1 10*3/uL (ref 0.0–0.1)
Basophils Relative: 1 %
Eosinophils Absolute: 0.3 10*3/uL (ref 0.0–0.5)
Eosinophils Relative: 3 %
HCT: 23.8 % — ABNORMAL LOW (ref 39.0–52.0)
Hemoglobin: 7.1 g/dL — ABNORMAL LOW (ref 13.0–17.0)
Immature Granulocytes: 1 %
Lymphocytes Relative: 8 %
Lymphs Abs: 0.7 10*3/uL (ref 0.7–4.0)
MCH: 22.7 pg — ABNORMAL LOW (ref 26.0–34.0)
MCHC: 29.8 g/dL — ABNORMAL LOW (ref 30.0–36.0)
MCV: 76 fL — ABNORMAL LOW (ref 80.0–100.0)
Monocytes Absolute: 0.7 10*3/uL (ref 0.1–1.0)
Monocytes Relative: 7 %
Neutro Abs: 8.1 10*3/uL — ABNORMAL HIGH (ref 1.7–7.7)
Neutrophils Relative %: 80 %
Platelet Count: 384 10*3/uL (ref 150–400)
RBC: 3.13 MIL/uL — ABNORMAL LOW (ref 4.22–5.81)
RDW: 20.6 % — ABNORMAL HIGH (ref 11.5–15.5)
WBC Count: 9.9 10*3/uL (ref 4.0–10.5)
nRBC: 0 % (ref 0.0–0.2)

## 2022-05-11 LAB — TSH: TSH: 17.966 u[IU]/mL — ABNORMAL HIGH (ref 0.350–4.500)

## 2022-05-11 MED ORDER — CLONAZEPAM 1 MG PO TABS: 1.0000 mg | ORAL_TABLET | Freq: Every day | ORAL | 0 refills | Status: AC

## 2022-05-11 MED ORDER — SODIUM CHLORIDE 0.9 % IV SOLN
400.0000 mg | Freq: Once | INTRAVENOUS | Status: AC
  Administered 2022-05-11: 400 mg via INTRAVENOUS
  Filled 2022-05-11: qty 16

## 2022-05-11 MED ORDER — SODIUM CHLORIDE 0.9 % IV SOLN
200.0000 mg | Freq: Once | INTRAVENOUS | Status: AC
  Administered 2022-05-11: 200 mg via INTRAVENOUS
  Filled 2022-05-11: qty 200

## 2022-05-11 MED ORDER — SODIUM CHLORIDE 0.9 % IV SOLN: Freq: Once | INTRAVENOUS | Status: AC

## 2022-05-11 NOTE — Progress Notes (Signed)
Hematology and Oncology Follow Up   Thomas Michael [id number] Dec 12, 1940 81 y.o. 05/11/2022 1:09 PM Thomas Michael, MDShadad, Mathis Dad, MD      Principle Diagnosis: 31 year old man with kidney cancer diagnosed in 2023.  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  He was found to have stage Ib tumor.  He is status post open treatment of impending pathological fracture of the left proximal femur with intramedullary implant completed on October 01, 2021.   Current therapy: Pembrolizumab 200 mg every 3 weeks started on September 08, 2021.  He returns for the next cycle of therapy.   Interim History: Thomas Michael is here for a repeat evaluation.  Since the last visit, he reports no major changes in his health.  He has reported more fatigue and tiredness and weakness in his lower extremity as well as 1 episode of fall.  He has also reported overall mental lethargy and periodic confusion according to his partner.  He denies any nausea, vomiting or abdominal pain.  He denies any constipation or diarrhea.  His performance status is limited and has had issues with overall decline.      Medications: Updated on review. Current Outpatient Medications  Medication Sig Dispense Refill   Accu-Chek FastClix Lancets MISC check CBG     acetaminophen (TYLENOL) 500 MG tablet Take 1,000 mg by mouth every 8 (eight) hours as needed for moderate pain.     axitinib (INLYTA) 5 MG tablet Take 1 tablet (5 mg total) by mouth daily. 30 tablet 1   Blood Glucose Monitoring Suppl (ACCU-CHEK AVIVA PLUS) w/Device KIT check CBG     chlorpheniramine-HYDROcodone 10-8 MG/5ML Take 5 mLs by mouth at bedtime as needed for cough. 473 mL 0   Cholecalciferol 50 MCG (2000 UT) TABS Take 2,000 Units by mouth daily at 12 noon.     ciclopirox (PENLAC) 8 % solution Apply 1 application topically  daily as needed (fungus (toes)). Apply over nail and surrounding skin. Apply daily as needed over previous coat.     clopidogrel (PLAVIX) 75 MG tablet Take 75 mg by mouth at bedtime.     diphenhydrAMINE (BENADRYL) 25 MG tablet Take 50 mg by mouth at bedtime.     docusate sodium (COLACE) 100 MG capsule Take 200 mg by mouth daily as needed for mild constipation.     ferrous sulfate 325 (65 FE) MG tablet Take 1 tablet (325 mg total) by mouth 2 (two) times daily. 60 tablet 3   guaiFENesin (MUCINEX) 600 MG 12 hr tablet Take 1,200 mg by mouth 2 (two) times daily as needed for to loosen phlegm or cough.     HYDROcodone-acetaminophen (NORCO) 5-325 MG tablet Take 2 tablets by mouth every 4 (four) hours as needed for moderate pain. 360 tablet 0   levothyroxine (SYNTHROID, LEVOTHROID) 200 MCG tablet Take 200 mcg by mouth every evening.     LORazepam (ATIVAN) 1 MG tablet Take 1 tablet (1 mg total) by mouth every 8 (eight) hours. 30 tablet 0   magic mouthwash w/lidocaine SOLN Take 5 mLs by mouth 4 (four) times daily. 5 ml QID PRN mouth sores, swish and spit 240 mL 0   metoprolol (TOPROL-XL) 50 MG 24 hr tablet Take 50 mg by mouth in the morning.     Multiple Vitamin (MULTIVITAMIN WITH MINERALS) TABS tablet Take  1 tablet by mouth daily at 12 noon.     niacin 500 MG CR capsule Take 500 mg by mouth daily at 12 noon.     nystatin-triamcinolone ointment (MYCOLOG) Apply 1 application. topically 2 (two) times daily. 30 g 3   Omega-3 Fatty Acids (FISH OIL PO) Take 1,000 mg by mouth daily at 12 noon.     polyethylene glycol (MIRALAX MIX-IN PAX) 17 g packet Take 17 g by mouth daily. 14 each 0   prochlorperazine (COMPAZINE) 10 MG tablet Take 1 tablet (10 mg total) by mouth every 6 (six) hours as needed for nausea or vomiting. 30 tablet 0   rOPINIRole (REQUIP) 0.25 MG tablet Take 0.25 mg by mouth 2 (two) times daily.     rosuvastatin (CRESTOR) 20 MG tablet Take 20 mg by mouth in the morning.     senna (SENOKOT) 8.6 MG TABS  tablet Take 1 tablet (8.6 mg total) by mouth 2 (two) times daily. 60 tablet 0   trazodone (DESYREL) 300 MG tablet Take 300 mg by mouth at bedtime.     valsartan-hydrochlorothiazide (DIOVAN-HCT) 160-12.5 MG per tablet Take 1 tablet by mouth in the morning.     VICTOZA 18 MG/3ML SOPN Inject 1.2 mg into the skin every evening.     vitamin E 400 UNIT capsule Take 400 Units by mouth daily at 12 noon.     No current facility-administered medications for this visit.     Allergies:  Allergies  Allergen Reactions   Oxycodone     Other reaction(s): vomiting   Promethazine-Codeine Other (See Comments)    Pt does not remember reaction   Physical exam      Blood pressure (!) 110/55, pulse 80, temperature 97.8 F (36.6 C), temperature source Temporal, resp. rate 18, height 5' 6" (1.676 m), weight 200 lb 11.2 oz (91 kg), SpO2 98 %.     ECOG 2   General appearance: Comfortable appearing without any discomfort Head: Normocephalic without any trauma Oropharynx: Mucous membranes are moist and pink without any thrush or ulcers. Eyes: Pupils are equal and round reactive to light. Lymph nodes: No cervical, supraclavicular, inguinal or axillary lymphadenopathy.   Heart:regular rate and rhythm.  S1 and S2 without leg edema. Lung: Clear without any rhonchi or wheezes.  No dullness to percussion. Abdomin: Soft, nontender, nondistended with good bowel sounds.  No hepatosplenomegaly. Musculoskeletal: No joint deformity or effusion.  Full range of motion noted. Neurological: No deficits noted on motor, sensory and deep tendon reflex exam. Skin: No petechial rash or dryness.  Appeared moist.          Lab Results: Lab Results  Component Value Date   WBC 9.6 04/20/2022   HGB 6.3 (LL) 04/20/2022   HCT 20.0 (L) 04/20/2022   MCV 74.3 (L) 04/20/2022   PLT 325 04/20/2022     Chemistry      Component Value Date/Time   NA 131 (L) 04/20/2022 1145   K 4.2 04/20/2022 1145   CL 99 04/20/2022  1145   CO2 25 04/20/2022 1145   BUN 12 04/20/2022 1145   CREATININE 0.98 04/20/2022 1145      Component Value Date/Time   CALCIUM 8.9 04/20/2022 1145   ALKPHOS 81 04/20/2022 1145   AST 17 04/20/2022 1145   ALT 18 04/20/2022 1145   BILITOT 0.4 04/20/2022 1145      I  IMPRESSION: 1. Status post right nephrectomy. No suspicious soft tissue or contrast enhancement in the nephrectomy bed. 2. Multiple  unchanged small bilateral pulmonary nodules. 3. Unchanged enlarged AP window and left hilar lymph nodes. 4. Multiple unchanged bilateral adrenal nodules. 5. There has however been significant interval enlargement of a soft tissue mass arising from the intratrochanteric left femur and proximal left femoral diaphysis consistent with a worsened metastasis. 6. Unchanged enhancing soft tissue nodule in the subcutaneous fat overlying the left scapula. 7. No evidence of new metastatic disease in the chest, abdomen, or pelvis. 8. Cardiomegaly and coronary artery disease.   Impression and Plan:  89 year old with:  1.  Kidney cancer diagnosed in 2023.  He presented with stage IV intermediate risk with pulmonary and bone involvement.    His disease status was updated at this time and treatment choices were reviewed.  CT scan obtained on May 04, 2022 showed no evidence of disease progression with overall stable imaging.  Risks and benefits of continuing Pembrolizumab versus switching to supportive care only were discussed.  He is agreeable to continue at this time and we will switch to every 6-week regimen for better scheduling inconvenience.   2.  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  His pain is manageable overall.   3.  Mental lethargy and mental status changes: He appears to be slightly worse than baseline but not dramatically different.  We will obtain MRI of the brain to rule out metastasis.  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  He understands that he prognosis associated with diet and likely will not proceed any additional treatment if he has CNS metastasis.   4.  Immune mediated complications: Autoimmune complications including pneumonitis, colitis and thyroid disease were reiterated.   5.  Anemia: He has received intravenous iron infusion for iron deficiency.  Hemoglobin is slightly improved currently.  I recommended continued oral iron therapy.  6.  Weight loss: We have discussed strategies to improve this at this time  7.  Prognosis and goals of care: His prognosis is guarded at this time given his overall functional decline and advanced disease.  He is still desiring aggressive measures at this time.   8.  Follow-up: He will return in 6 weeks for the next cycle of therapy.   30  minutes were spent on this visit.  The time was dedicated to reviewing laboratory data, disease status update and outlining future plan of care reviewed.   Zola Button, MD 05/11/2022 1:09 PM

## 2022-05-12 LAB — T4: T4, Total: 5.1 ug/dL (ref 4.5–12.0)

## 2022-05-13 ENCOUNTER — Other Ambulatory Visit: Payer: Self-pay | Admitting: *Deleted

## 2022-05-13 DIAGNOSIS — C649 Malignant neoplasm of unspecified kidney, except renal pelvis: Secondary | ICD-10-CM

## 2022-05-13 MED ORDER — HYDROCODONE-ACETAMINOPHEN 5-325 MG PO TABS: 2.0000 | ORAL_TABLET | ORAL | 0 refills | Status: DC | PRN

## 2022-05-14 ENCOUNTER — Ambulatory Visit: Payer: Medicare Other

## 2022-05-16 ENCOUNTER — Other Ambulatory Visit: Payer: Self-pay | Admitting: Oncology

## 2022-05-16 DIAGNOSIS — C649 Malignant neoplasm of unspecified kidney, except renal pelvis: Secondary | ICD-10-CM

## 2022-05-17 ENCOUNTER — Other Ambulatory Visit: Payer: Self-pay

## 2022-05-19 ENCOUNTER — Encounter: Payer: Self-pay | Admitting: Oncology

## 2022-05-19 MED ORDER — HYDROCODONE-ACETAMINOPHEN 5-325 MG PO TABS: 2.0000 | ORAL_TABLET | ORAL | 0 refills | Status: DC | PRN

## 2022-05-21 ENCOUNTER — Inpatient Hospital Stay: Payer: Medicare Other | Attending: Physician Assistant

## 2022-05-24 ENCOUNTER — Emergency Department (HOSPITAL_COMMUNITY): Payer: Medicare Other

## 2022-05-24 ENCOUNTER — Observation Stay (HOSPITAL_COMMUNITY): Payer: Medicare Other

## 2022-05-24 ENCOUNTER — Other Ambulatory Visit: Payer: Self-pay

## 2022-05-24 ENCOUNTER — Encounter (HOSPITAL_COMMUNITY): Payer: Self-pay

## 2022-05-24 ENCOUNTER — Telehealth: Payer: Self-pay

## 2022-05-24 ENCOUNTER — Inpatient Hospital Stay (HOSPITAL_COMMUNITY)
Admission: EM | Admit: 2022-05-24 | Discharge: 2022-05-27 | DRG: 812 | Disposition: A | Discharge status: Hospice | Payer: Medicare Other | Attending: Family Medicine | Admitting: Family Medicine

## 2022-05-24 DIAGNOSIS — G939 Disorder of brain, unspecified: Secondary | ICD-10-CM

## 2022-05-24 DIAGNOSIS — E871 Hypo-osmolality and hyponatremia: Secondary | ICD-10-CM | POA: Diagnosis present

## 2022-05-24 DIAGNOSIS — C649 Malignant neoplasm of unspecified kidney, except renal pelvis: Secondary | ICD-10-CM

## 2022-05-24 DIAGNOSIS — I119 Hypertensive heart disease without heart failure: Secondary | ICD-10-CM | POA: Diagnosis present

## 2022-05-24 DIAGNOSIS — K5900 Constipation, unspecified: Secondary | ICD-10-CM | POA: Diagnosis present

## 2022-05-24 DIAGNOSIS — I959 Hypotension, unspecified: Secondary | ICD-10-CM | POA: Diagnosis not present

## 2022-05-24 DIAGNOSIS — T68XXXA Hypothermia, initial encounter: Secondary | ICD-10-CM | POA: Diagnosis not present

## 2022-05-24 DIAGNOSIS — Z79890 Hormone replacement therapy (postmenopausal): Secondary | ICD-10-CM | POA: Diagnosis not present

## 2022-05-24 DIAGNOSIS — F419 Anxiety disorder, unspecified: Secondary | ICD-10-CM | POA: Diagnosis present

## 2022-05-24 DIAGNOSIS — C7800 Secondary malignant neoplasm of unspecified lung: Secondary | ICD-10-CM | POA: Diagnosis present

## 2022-05-24 DIAGNOSIS — L8990 Pressure ulcer of unspecified site, unspecified stage: Secondary | ICD-10-CM | POA: Insufficient documentation

## 2022-05-24 DIAGNOSIS — R410 Disorientation, unspecified: Secondary | ICD-10-CM | POA: Diagnosis not present

## 2022-05-24 DIAGNOSIS — L89151 Pressure ulcer of sacral region, stage 1: Secondary | ICD-10-CM | POA: Diagnosis present

## 2022-05-24 DIAGNOSIS — Z7902 Long term (current) use of antithrombotics/antiplatelets: Secondary | ICD-10-CM

## 2022-05-24 DIAGNOSIS — E119 Type 2 diabetes mellitus without complications: Secondary | ICD-10-CM | POA: Diagnosis present

## 2022-05-24 DIAGNOSIS — Z9221 Personal history of antineoplastic chemotherapy: Secondary | ICD-10-CM | POA: Diagnosis not present

## 2022-05-24 DIAGNOSIS — Z955 Presence of coronary angioplasty implant and graft: Secondary | ICD-10-CM | POA: Diagnosis not present

## 2022-05-24 DIAGNOSIS — Z7401 Bed confinement status: Secondary | ICD-10-CM | POA: Diagnosis not present

## 2022-05-24 DIAGNOSIS — Z515 Encounter for palliative care: Secondary | ICD-10-CM | POA: Diagnosis not present

## 2022-05-24 DIAGNOSIS — Z85528 Personal history of other malignant neoplasm of kidney: Secondary | ICD-10-CM

## 2022-05-24 DIAGNOSIS — E785 Hyperlipidemia, unspecified: Secondary | ICD-10-CM | POA: Diagnosis present

## 2022-05-24 DIAGNOSIS — I159 Secondary hypertension, unspecified: Secondary | ICD-10-CM | POA: Diagnosis not present

## 2022-05-24 DIAGNOSIS — E039 Hypothyroidism, unspecified: Secondary | ICD-10-CM | POA: Diagnosis not present

## 2022-05-24 DIAGNOSIS — C7951 Secondary malignant neoplasm of bone: Secondary | ICD-10-CM | POA: Diagnosis present

## 2022-05-24 DIAGNOSIS — C779 Secondary and unspecified malignant neoplasm of lymph node, unspecified: Secondary | ICD-10-CM | POA: Diagnosis present

## 2022-05-24 DIAGNOSIS — Z66 Do not resuscitate: Secondary | ICD-10-CM | POA: Diagnosis present

## 2022-05-24 DIAGNOSIS — D6481 Anemia due to antineoplastic chemotherapy: Secondary | ICD-10-CM | POA: Diagnosis present

## 2022-05-24 DIAGNOSIS — G9389 Other specified disorders of brain: Secondary | ICD-10-CM | POA: Diagnosis not present

## 2022-05-24 DIAGNOSIS — I2510 Atherosclerotic heart disease of native coronary artery without angina pectoris: Secondary | ICD-10-CM | POA: Diagnosis present

## 2022-05-24 DIAGNOSIS — Z905 Acquired absence of kidney: Secondary | ICD-10-CM

## 2022-05-24 DIAGNOSIS — R531 Weakness: Secondary | ICD-10-CM | POA: Diagnosis not present

## 2022-05-24 DIAGNOSIS — R4182 Altered mental status, unspecified: Secondary | ICD-10-CM | POA: Diagnosis not present

## 2022-05-24 DIAGNOSIS — Z9049 Acquired absence of other specified parts of digestive tract: Secondary | ICD-10-CM

## 2022-05-24 DIAGNOSIS — Z7985 Long-term (current) use of injectable non-insulin antidiabetic drugs: Secondary | ICD-10-CM

## 2022-05-24 DIAGNOSIS — D649 Anemia, unspecified: Secondary | ICD-10-CM | POA: Diagnosis not present

## 2022-05-24 DIAGNOSIS — I10 Essential (primary) hypertension: Secondary | ICD-10-CM | POA: Diagnosis present

## 2022-05-24 DIAGNOSIS — Z79891 Long term (current) use of opiate analgesic: Secondary | ICD-10-CM

## 2022-05-24 DIAGNOSIS — Z885 Allergy status to narcotic agent status: Secondary | ICD-10-CM

## 2022-05-24 DIAGNOSIS — R0689 Other abnormalities of breathing: Secondary | ICD-10-CM | POA: Diagnosis not present

## 2022-05-24 DIAGNOSIS — Z6832 Body mass index (BMI) 32.0-32.9, adult: Secondary | ICD-10-CM

## 2022-05-24 DIAGNOSIS — Z87891 Personal history of nicotine dependence: Secondary | ICD-10-CM

## 2022-05-24 DIAGNOSIS — C641 Malignant neoplasm of right kidney, except renal pelvis: Secondary | ICD-10-CM | POA: Diagnosis not present

## 2022-05-24 DIAGNOSIS — Z79899 Other long term (current) drug therapy: Secondary | ICD-10-CM

## 2022-05-24 DIAGNOSIS — R442 Other hallucinations: Secondary | ICD-10-CM | POA: Diagnosis not present

## 2022-05-24 DIAGNOSIS — Z803 Family history of malignant neoplasm of breast: Secondary | ICD-10-CM

## 2022-05-24 DIAGNOSIS — T451X5A Adverse effect of antineoplastic and immunosuppressive drugs, initial encounter: Secondary | ICD-10-CM | POA: Diagnosis present

## 2022-05-24 LAB — CBC WITH DIFFERENTIAL/PLATELET
Abs Immature Granulocytes: 0.03 10*3/uL (ref 0.00–0.07)
Basophils Absolute: 0.1 10*3/uL (ref 0.0–0.1)
Basophils Relative: 1 %
Eosinophils Absolute: 0.2 10*3/uL (ref 0.0–0.5)
Eosinophils Relative: 2 %
HCT: 23.1 % — ABNORMAL LOW (ref 39.0–52.0)
Hemoglobin: 6.5 g/dL — CL (ref 13.0–17.0)
Immature Granulocytes: 0 %
Lymphocytes Relative: 11 %
Lymphs Abs: 0.9 10*3/uL (ref 0.7–4.0)
MCH: 22.3 pg — ABNORMAL LOW (ref 26.0–34.0)
MCHC: 28.1 g/dL — ABNORMAL LOW (ref 30.0–36.0)
MCV: 79.4 fL — ABNORMAL LOW (ref 80.0–100.0)
Monocytes Absolute: 0.6 10*3/uL (ref 0.1–1.0)
Monocytes Relative: 8 %
Neutro Abs: 6 10*3/uL (ref 1.7–7.7)
Neutrophils Relative %: 78 %
Platelets: 350 10*3/uL (ref 150–400)
RBC: 2.91 MIL/uL — ABNORMAL LOW (ref 4.22–5.81)
RDW: 20.5 % — ABNORMAL HIGH (ref 11.5–15.5)
WBC: 7.8 10*3/uL (ref 4.0–10.5)
nRBC: 0 % (ref 0.0–0.2)

## 2022-05-24 LAB — COMPREHENSIVE METABOLIC PANEL
ALT: 25 U/L (ref 0–44)
AST: 29 U/L (ref 15–41)
Albumin: 2.4 g/dL — ABNORMAL LOW (ref 3.5–5.0)
Alkaline Phosphatase: 83 U/L (ref 38–126)
Anion gap: 7 (ref 5–15)
BUN: 17 mg/dL (ref 8–23)
CO2: 25 mmol/L (ref 22–32)
Calcium: 8.7 mg/dL — ABNORMAL LOW (ref 8.9–10.3)
Chloride: 102 mmol/L (ref 98–111)
Creatinine, Ser: 1.21 mg/dL (ref 0.61–1.24)
GFR, Estimated: >60 mL/min (ref >60)
Glucose, Bld: 117 mg/dL — ABNORMAL HIGH (ref 70–99)
Potassium: 4.3 mmol/L (ref 3.5–5.1)
Sodium: 134 mmol/L — ABNORMAL LOW (ref 135–145)
Total Bilirubin: 0.5 mg/dL (ref 0.3–1.2)
Total Protein: 6.5 g/dL (ref 6.5–8.1)

## 2022-05-24 LAB — GLUCOSE, CAPILLARY: Glucose-Capillary: 99 mg/dL (ref 70–99)

## 2022-05-24 LAB — URINALYSIS, ROUTINE W REFLEX MICROSCOPIC
Bilirubin Urine: NEGATIVE
Glucose, UA: NEGATIVE mg/dL
Hgb urine dipstick: NEGATIVE
Ketones, ur: NEGATIVE mg/dL
Leukocytes,Ua: NEGATIVE
Nitrite: NEGATIVE
Protein, ur: NEGATIVE mg/dL
Specific Gravity, Urine: 1.015 (ref 1.005–1.030)
pH: 5 (ref 5.0–8.0)

## 2022-05-24 LAB — PREPARE RBC (CROSSMATCH)

## 2022-05-24 LAB — TROPONIN I (HIGH SENSITIVITY)
Troponin I (High Sensitivity): 6 ng/L (ref <18)
Troponin I (High Sensitivity): 6 ng/L (ref <18)

## 2022-05-24 LAB — LACTIC ACID, PLASMA: Lactic Acid, Venous: 1.3 mmol/L (ref 0.5–1.9)

## 2022-05-24 LAB — MAGNESIUM: Magnesium: 2.1 mg/dL (ref 1.7–2.4)

## 2022-05-24 MED ORDER — VITAMIN E 45 MG (100 UNIT) PO CAPS
400.0000 [IU] | ORAL_CAPSULE | Freq: Every day | ORAL | Status: DC
  Administered 2022-05-25: 400 [IU] via ORAL
  Filled 2022-05-24: qty 4

## 2022-05-24 MED ORDER — TRAZODONE HCL 100 MG PO TABS
300.0000 mg | ORAL_TABLET | Freq: Every day | ORAL | Status: DC
  Administered 2022-05-24 – 2022-05-26 (×3): 300 mg via ORAL
  Filled 2022-05-24: qty 3
  Filled 2022-05-24 (×2): qty 6

## 2022-05-24 MED ORDER — VITAMIN D 25 MCG (1000 UNIT) PO TABS
2000.0000 [IU] | ORAL_TABLET | Freq: Every day | ORAL | Status: DC
  Administered 2022-05-25: 2000 [IU] via ORAL
  Filled 2022-05-24: qty 2

## 2022-05-24 MED ORDER — CLONAZEPAM 1 MG PO TABS
1.0000 mg | ORAL_TABLET | Freq: Every day | ORAL | Status: DC
  Administered 2022-05-24: 1 mg via ORAL
  Filled 2022-05-24: qty 1

## 2022-05-24 MED ORDER — ROPINIROLE HCL 0.25 MG PO TABS
0.2500 mg | ORAL_TABLET | Freq: Two times a day (BID) | ORAL | Status: DC
  Administered 2022-05-24 – 2022-05-25 (×2): 0.25 mg via ORAL
  Filled 2022-05-24 (×2): qty 1

## 2022-05-24 MED ORDER — FERROUS SULFATE 325 (65 FE) MG PO TABS
325.0000 mg | ORAL_TABLET | Freq: Two times a day (BID) | ORAL | Status: DC
  Administered 2022-05-24 – 2022-05-25 (×2): 325 mg via ORAL
  Filled 2022-05-24 (×2): qty 1

## 2022-05-24 MED ORDER — ACETAMINOPHEN 325 MG PO TABS
650.0000 mg | ORAL_TABLET | Freq: Four times a day (QID) | ORAL | Status: DC | PRN
  Administered 2022-05-25 – 2022-05-27 (×3): 650 mg via ORAL
  Filled 2022-05-24 (×3): qty 2

## 2022-05-24 MED ORDER — SODIUM CHLORIDE 0.9% IV SOLUTION: Freq: Once | INTRAVENOUS | Status: AC

## 2022-05-24 MED ORDER — MORPHINE SULFATE (PF) 2 MG/ML IV SOLN
1.0000 mg | INTRAVENOUS | Status: DC | PRN
  Administered 2022-05-25: 1 mg via INTRAVENOUS
  Filled 2022-05-24 (×2): qty 1

## 2022-05-24 MED ORDER — LEVOTHYROXINE SODIUM 100 MCG PO TABS
200.0000 ug | ORAL_TABLET | Freq: Every day | ORAL | Status: DC
  Administered 2022-05-25: 200 ug via ORAL
  Filled 2022-05-24: qty 2

## 2022-05-24 MED ORDER — OMEGA-3-ACID ETHYL ESTERS 1 G PO CAPS
1.0000 g | ORAL_CAPSULE | Freq: Every day | ORAL | Status: DC
  Administered 2022-05-25: 1 g via ORAL
  Filled 2022-05-24: qty 1

## 2022-05-24 MED ORDER — SODIUM CHLORIDE 0.9 % IV BOLUS
1000.0000 mL | Freq: Once | INTRAVENOUS | Status: AC
  Administered 2022-05-24: 1000 mL via INTRAVENOUS

## 2022-05-24 MED ORDER — DOCUSATE SODIUM 100 MG PO CAPS: 200.0000 mg | ORAL_CAPSULE | Freq: Every day | ORAL | Status: DC | PRN

## 2022-05-24 MED ORDER — HYDROCODONE-ACETAMINOPHEN 5-325 MG PO TABS
2.0000 | ORAL_TABLET | ORAL | Status: DC | PRN
  Administered 2022-05-24: 2 via ORAL
  Filled 2022-05-24: qty 2

## 2022-05-24 MED ORDER — NIACIN ER (ANTIHYPERLIPIDEMIC) 500 MG PO TBCR
500.0000 mg | EXTENDED_RELEASE_TABLET | Freq: Every day | ORAL | Status: DC
  Administered 2022-05-25: 500 mg via ORAL
  Filled 2022-05-24: qty 1

## 2022-05-24 MED ORDER — GADOBUTROL 1 MMOL/ML IV SOLN
9.0000 mL | Freq: Once | INTRAVENOUS | Status: AC | PRN
  Administered 2022-05-24: 9 mL via INTRAVENOUS

## 2022-05-24 NOTE — ED Provider Notes (Signed)
[facility] DEPT Provider Note   CSN: [id number] Arrival date & time: 05/24/22  1609     History {Add pertinent medical, surgical, social history, OB history to HPI:1} Chief Complaint  Patient presents with   Weakness    Thomas Michael is a 81 y.o. male.  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  He had an intramedullary rod placed in his left femur this spring.  He is currently on chemotherapy.  His daughter is bringing him in today for a steady decline over the past week.  She said he has been more confused.  Very weak and unable to stand or transfer now.  Had been able to transfer and use of walker to get around.  Not eating or drinking very much.  He denies headache chest pain cough shortness of breath abdominal pain vomiting diarrhea.  No dysuria but has been urinating less.  Daughter tells me they are making referral to hospice in [place].  She does not think he is going to be able to remain at home as he currently is.  The history is provided by the patient and a relative.  Weakness Severity:  Severe Onset quality:  Gradual Duration:  1 week Timing:  Constant Progression:  Worsening Chronicity:  New Relieved by:  Nothing Worsened by:  Activity Ineffective treatments:  Rest Associated symptoms: difficulty walking, lethargy and nausea   Associated symptoms: no abdominal pain, no chest pain, no cough, no diarrhea, no dysuria, no fever, no frequency, no shortness of breath and no vomiting        Home Medications Prior to Admission medications   Medication Sig Start Date End Date Taking? Authorizing Provider  Accu-Chek FastClix Lancets MISC check CBG 02/07/17   [provider]  acetaminophen (TYLENOL) 500 MG tablet Take 1,000 mg by mouth every 8 (eight) hours as needed for moderate pain.    [provider]  axitinib (INLYTA) 5 MG tablet Take 1 tablet (5 mg  total) by mouth daily. 08/28/21   Wyatt Portela, MD  Blood Glucose Monitoring Suppl (ACCU-CHEK AVIVA PLUS) w/Device KIT check CBG 02/07/17   [provider]  chlorpheniramine-HYDROcodone 10-8 MG/5ML Take 5 mLs by mouth at bedtime as needed for cough. 11/27/21   Wyatt Portela, MD  Cholecalciferol 50 MCG (2000 UT) TABS Take 2,000 Units by mouth daily at 12 noon.    [provider]  ciclopirox (PENLAC) 8 % solution Apply 1 application topically daily as needed (fungus (toes)). Apply over nail and surrounding skin. Apply daily as needed over previous coat.    [provider]  clonazePAM (KLONOPIN) 1 MG tablet Take 1 tablet (1 mg total) by mouth at bedtime. 05/11/22   Wyatt Portela, MD  clopidogrel (PLAVIX) 75 MG tablet Take 75 mg by mouth at bedtime.    [provider]  diphenhydrAMINE (BENADRYL) 25 MG tablet Take 50 mg by mouth at bedtime.    [provider]  docusate sodium (COLACE) 100 MG capsule Take 200 mg by mouth daily as needed for mild constipation.    [provider]  ferrous sulfate 325 (65 FE) MG tablet Take 1 tablet (325 mg total) by mouth 2 (two) times daily. 01/26/22   Wyatt Portela, MD  guaiFENesin (MUCINEX) 600 MG 12 hr tablet Take 1,200 mg by mouth 2 (two) times daily as needed for to loosen phlegm or cough.  [provider]  HYDROcodone-acetaminophen (NORCO) 5-325 MG tablet Take 2 tablets by mouth every 4 (four) hours as needed for moderate pain. 05/19/22   Wyatt Portela, MD  levothyroxine (SYNTHROID, LEVOTHROID) 200 MCG tablet Take 200 mcg by mouth every evening.    [provider]  magic mouthwash w/lidocaine SOLN Take 5 mLs by mouth 4 (four) times daily. 5 ml QID PRN mouth sores, swish and spit 12/31/21   Wyatt Portela, MD  metoprolol (TOPROL-XL) 50 MG 24 hr tablet Take 50 mg by mouth in the morning.    [provider]  Multiple Vitamin (MULTIVITAMIN WITH MINERALS) TABS tablet Take 1 tablet by  mouth daily at 12 noon.    [provider]  niacin 500 MG CR capsule Take 500 mg by mouth daily at 12 noon.    [provider]  nystatin-triamcinolone ointment (MYCOLOG) Apply 1 application. topically 2 (two) times daily. 11/19/21   Wyatt Portela, MD  Omega-3 Fatty Acids (FISH OIL PO) Take 1,000 mg by mouth daily at 12 noon.    [provider]  polyethylene glycol (MIRALAX MIX-IN PAX) 17 g packet Take 17 g by mouth daily. 10/08/21   Shelly Coss, MD  prochlorperazine (COMPAZINE) 10 MG tablet Take 1 tablet (10 mg total) by mouth every 6 (six) hours as needed for nausea or vomiting. 08/28/21   Wyatt Portela, MD  rOPINIRole (REQUIP) 0.25 MG tablet Take 0.25 mg by mouth 2 (two) times daily.    [provider]  rosuvastatin (CRESTOR) 20 MG tablet Take 20 mg by mouth in the morning.    [provider]  senna (SENOKOT) 8.6 MG TABS tablet Take 1 tablet (8.6 mg total) by mouth 2 (two) times daily. 10/08/21   Shelly Coss, MD  trazodone (DESYREL) 300 MG tablet Take 300 mg by mouth at bedtime.    [provider]  valsartan-hydrochlorothiazide (DIOVAN-HCT) 160-12.5 MG per tablet Take 1 tablet by mouth in the morning. 05/24/14   [provider]  VICTOZA 18 MG/3ML SOPN Inject 1.2 mg into the skin every evening. 03/30/17   [provider]  vitamin E 400 UNIT capsule Take 400 Units by mouth daily at 12 noon.    [provider]      Allergies    Oxycodone and Promethazine-codeine    Review of Systems   Review of Systems  Constitutional:  Negative for fever.  HENT:  Negative for sore throat.   Respiratory:  Negative for cough and shortness of breath.   Cardiovascular:  Negative for chest pain.  Gastrointestinal:  Positive for nausea. Negative for abdominal pain, diarrhea and vomiting.  Genitourinary:  Negative for dysuria and frequency.  Skin:  Negative for rash.  Neurological:  Positive for weakness.    Physical  Exam Updated Vital Signs BP (!) 112/55   Pulse (!) 56   Temp 98.2 F (36.8 C)   Resp (!) 7   SpO2 96%  Physical Exam Vitals and nursing note reviewed.  Constitutional:      General: He is not in acute distress.    Appearance: Normal appearance. He is well-developed.  HENT:     Head: Normocephalic and atraumatic.  Eyes:     Conjunctiva/sclera: Conjunctivae normal.  Cardiovascular:     Rate and Rhythm: Normal rate and regular rhythm.     Heart sounds: No murmur heard. Pulmonary:     Effort: Pulmonary effort is normal. No respiratory distress.     Breath sounds: Normal  breath sounds.  Abdominal:     Palpations: Abdomen is soft.     Tenderness: There is no abdominal tenderness. There is no guarding or rebound.  Musculoskeletal:     Cervical back: Neck supple.  Skin:    General: Skin is warm and dry.     Capillary Refill: Capillary refill takes less than 2 seconds.  Neurological:     General: No focal deficit present.     Mental Status: He is alert.     ED Results / Procedures / Treatments   Labs (all labs ordered are listed, but only abnormal results are displayed) Labs Reviewed - No data to display  EKG None  Radiology No results found.  Procedures Procedures  {Document cardiac monitor, telemetry assessment procedure when appropriate:1}  Medications Ordered in ED Medications  sodium chloride 0.9 % bolus 1,000 mL (has no administration in time range)    ED Course/ Medical Decision Making/ A&P                           Medical Decision Making Amount and/or Complexity of Data Reviewed Labs: ordered. Radiology: ordered.   This patient complains of ***; this involves an extensive number of treatment Options and is a complaint that carries with it a high risk of complications and morbidity. The differential includes ***  I ordered, reviewed and interpreted labs, which included *** I ordered medication *** and reviewed PMP when indicated. I ordered  imaging studies which included *** and I independently    visualized and interpreted imaging which showed *** Additional history obtained from *** Previous records obtained and reviewed *** I consulted *** and discussed lab and imaging findings and discussed disposition.  Cardiac monitoring reviewed, *** Social determinants considered, *** Critical Interventions: ***  After the interventions stated above, I reevaluated the patient and found *** Admission and further testing considered, ***   {Document critical care time when appropriate:1} {Document review of labs and clinical decision tools ie heart score, Chads2Vasc2 etc:1}  {Document your independent review of radiology images, and any outside records:1} {Document your discussion with family members, caretakers, and with consultants:1} {Document social determinants of health affecting pt's care:1} {Document your decision making why or why not admission, treatments were needed:1} Final Clinical Impression(s) / ED Diagnoses Final diagnoses:  None    Rx / DC Orders ED Discharge Orders     None

## 2022-05-24 NOTE — Assessment & Plan Note (Signed)
-  for the past has had gradual worsening of his TSH most likely due to immunotherapy.  -TSH of 13.871 (10/13) -17.966 (10/24) -pt no longer wants to continue immunotherapy so will not increase his levothyroxine dose

## 2022-05-24 NOTE — Telephone Encounter (Signed)
T/C from pt's daughter, Precious Bard, stating her father has had a decline mentally and physically.  He is very weak, anxious and having severe depression and hopelessness. He and his wife are about to be separated. He is asking to  stop treatment and go to a Hospice facility  ( in [facility])  Daughter is asking for advise and recommendations

## 2022-05-24 NOTE — ED Triage Notes (Addendum)
Pt BIB EMS from home. Pt has kidney and bone cancer, pt c/o increased weakness and confusion per family x1 week. Pt family c/o decreased eating and drinking x1 week. Pt family c/o foul odor urine x1 week.  CBG 170 114/86 HR 62 700 mL NS given by EMS 18G R FA 16 Resp

## 2022-05-24 NOTE — Assessment & Plan Note (Signed)
Stable.  Continue home meds. ?

## 2022-05-24 NOTE — Assessment & Plan Note (Signed)
Has been following with Dr. Alen Blew and receiving immunotherapy with Pembrolizumab -pt no longer wants treatment going forward -needs palliative care consult

## 2022-05-24 NOTE — Assessment & Plan Note (Signed)
-  CT of the head is concerning for new left intraventricular mass of 1 cm  -pt is okay with proceeding with MRI brain with contrast just to know if his cancer has spread but he absolutely does not want to pursue anymore treatment

## 2022-05-24 NOTE — Assessment & Plan Note (Signed)
Baseline between 7-8. Hgb of 6.5 on presentation likely secondary to cancer treatment -transfuse 1u pRBC and follow post H/H -transfuse threshold of <7

## 2022-05-24 NOTE — Assessment & Plan Note (Signed)
BMI of 32 noted

## 2022-05-24 NOTE — Telephone Encounter (Signed)
T/C received from pt's daughter and pt stating pt needs help and is not able to walk w/out assistance, having mental status changes and severe fatigue.   Pt and daughter were advised to go to the ED for evaluation.  Both were in agreement with this plan.   *Hospice referral has been faxed to [facility]  [phone]  per pt and daughter's request

## 2022-05-24 NOTE — H&P (Signed)
History and Physical    Patient: Thomas Michael DQQ:[id number] DOB: [birth year] DOA: 05/24/2022 DOS: the patient was seen and examined on 05/24/2022 PCP: [name], MD  Patient coming from: Home  Chief Complaint:  Chief Complaint  Patient presents with   Weakness   HPI: AXIEL FJELD is a 81 y.o. 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   Pt has been having gradual decline in his health that has worsened over the past week. He is so weak he is no longer able to get up from his recliner.Has felt cognitive slowing. Has poor appetite. He does not want to continue to chemotherapy because he feels it will make no difference as he knows his condition is terminal.   In the ED, he was afebrile normotensive heart rate 58.  WBC of 7.8, hemoglobin of 6.5 with baseline around 7-8  Sodium of 134, K of 4.3, creatinine of 1.21, CBG of 117  CT of the head is concerning for new left intraventricular mass of 1 cm with MRI recommended.  Chest x-ray cardiomegaly and vascular congestion. Review of Systems: As mentioned in the history of present illness. All other systems reviewed and are negative. Past Medical History:  Diagnosis Date   CAD (coronary artery disease)    Diabetes mellitus without complication ([facility])    Dyspnea    Facial nerve disorder, unspecified    HLD (hyperlipidemia)    HTN (hypertension)    Jan 2010 He had a 95% mid LAD lesion. There was 99% stenosis at the distal edge of the stented area in the right coronary artery. The EF was 65%. He had drug-eluting stents placed in both the LAD and the right coronary artery.   Hypothyroidism    Obesity    Other facial nerve disorders    Renal cell carcinoma of right kidney  (HCC)    Syncope    Unspecified ptosis of eyelid    Past Surgical History:  Procedure Laterality Date   CHOLECYSTECTOMY     CHOLECYSTECTOMY     CORONARY STENT PLACEMENT     FEMUR IM NAIL Left 10/01/2021   Procedure: left intertrochanteric intramedullary nail;  Surgeon: Leandrew Koyanagi, MD;  Location: [facility];  Service: Orthopedics;  Laterality: Left;   MENISCECTOMY Right    NEPHRECTOMY RADICAL Right 2012   TONSILLECTOMY     Social History:  reports that he quit smoking about 53 years ago. His smoking use included cigarettes. He has a 24.00 pack-year smoking history. He has never used smokeless tobacco. He reports current alcohol use of about 2.0 standard drinks of alcohol per week. He reports that he does not use drugs.  Allergies  Allergen Reactions   Oxycodone     Other reaction(s): vomiting   Promethazine-Codeine Other (See Comments)    Pt does not remember reaction    Family History  Problem Relation Age of Onset   Gallbladder disease Mother    Heart disease Father    Breast cancer Sister 77   High blood pressure Sister     Prior to Admission medications   Medication Sig Start Date End Date Taking? Authorizing Provider  acetaminophen (TYLENOL) 500 MG tablet Take 1,000 mg by mouth every 8 (eight) hours as needed for moderate pain.   Yes [provider]  camphor-menthol (SARNA) lotion Apply 1 Application topically as needed for itching.   Yes [provider]  Cholecalciferol 50 MCG (2000 UT) TABS Take 2,000 Units by mouth daily at 12 noon.   Yes [provider]  ciclopirox (PENLAC) 8 % solution Apply 1 application topically daily as needed (fungus (toes)). Apply over nail and surrounding skin. Apply daily as needed over previous coat.   Yes [provider]  clonazePAM (KLONOPIN) 1 MG tablet Take 1 tablet (1 mg total) by mouth at bedtime. 05/11/22  Yes Wyatt Portela, MD  diphenhydrAMINE (BENADRYL) 25 MG tablet Take 50 mg by mouth at bedtime.  For itching   Yes [provider]  docusate sodium (COLACE) 100 MG capsule Take 200 mg by mouth daily as needed for mild constipation.   Yes [provider]  ferrous sulfate 325 (65 FE) MG tablet Take 1 tablet (325 mg total) by mouth 2 (two) times daily. 01/26/22  Yes Wyatt Portela, MD  HYDROcodone-acetaminophen (NORCO) 5-325 MG tablet Take 2 tablets by mouth every 4 (four) hours as needed for moderate pain. 05/19/22  Yes Wyatt Portela, MD  levothyroxine (SYNTHROID, LEVOTHROID) 200 MCG tablet Take 200 mcg by mouth every evening.   Yes [provider]  lidocaine (XYLOCAINE) 4 % external solution Apply topically as needed for mild pain or moderate pain. ZO109 -- 4% Lidocaine HLC spray   Yes [provider]  magic mouthwash w/lidocaine SOLN Take 5 mLs by mouth 4 (four) times daily. 5 ml QID PRN mouth sores, swish and spit 12/31/21  Yes Shadad, Mathis Dad, MD  Multiple Vitamin (MULTIVITAMIN WITH MINERALS) TABS tablet Take 1 tablet by mouth daily at 12 noon.   Yes [provider]  niacin 500 MG CR capsule Take 500 mg by mouth daily at 12 noon.   Yes [provider]  nystatin-triamcinolone ointment (MYCOLOG) Apply 1 application. topically 2 (two) times daily. 11/19/21  Yes Wyatt Portela, MD  Omega-3 Fatty Acids (FISH OIL PO) Take 1,000 mg by mouth daily at 12 noon.   Yes [provider]  polyethylene glycol (MIRALAX MIX-IN PAX) 17 g packet Take 17 g by mouth daily. 10/08/21  Yes Shelly Coss, MD  rOPINIRole (REQUIP) 0.25 MG tablet Take 0.25 mg by mouth 2 (two) times daily.   Yes [provider]  senna (SENOKOT) 8.6 MG TABS tablet Take 1 tablet (8.6 mg total) by mouth 2 (two) times daily. 10/08/21  Yes Shelly Coss, MD  trazodone (DESYREL) 300 MG tablet Take 300 mg by mouth at bedtime.   Yes [provider]  VICTOZA 18 MG/3ML SOPN Inject 1.2 mg into the skin every evening. 03/30/17  Yes [provider]  vitamin E 400  UNIT capsule Take 400 Units by mouth daily at 12 noon.   Yes [provider]  Accu-Chek FastClix Lancets MISC check CBG 02/07/17   [provider]  axitinib (INLYTA) 5 MG tablet Take 1 tablet (5 mg total) by mouth daily. 08/28/21   Wyatt Portela, MD  Blood Glucose Monitoring Suppl (ACCU-CHEK AVIVA PLUS) w/Device KIT check CBG 02/07/17   [provider]  chlorpheniramine-HYDROcodone 10-8 MG/5ML Take 5 mLs by mouth at bedtime as needed for cough. Patient not taking: Reported on 05/24/2022 11/27/21   Wyatt Portela, MD  metoprolol (TOPROL-XL) 50 MG 24 hr tablet Take 50 mg by mouth in the morning. Patient not taking: Reported on 05/24/2022    [provider]  valsartan-hydrochlorothiazide (  DIOVAN-HCT) 160-12.5 MG per tablet Take 1 tablet by mouth in the morning. Patient not taking: Reported on 05/24/2022 05/24/14   [provider]    Physical Exam: Vitals:   05/24/22 1845 05/24/22 1930 05/24/22 2000 05/24/22 2031  BP: (!) 114/103 (!) 131/56  (!) 123/57  Pulse: 65 84  71  Resp: 14 (!) 21  18  Temp:   98.2 F (36.8 C) 98.5 F (36.9 C)  TempSrc:    Oral  SpO2: 99% 94%  98%  Weight:    90.2 kg   Constitutional: NAD, calm, comfortable, chronically ill-appearing elderly male laying flat in bed Eyes:  lids and conjunctivae normal ENMT: Mucous membranes are moist. Neck: normal, supple Respiratory: clear to auscultation bilaterally, no wheezing, no crackles. Normal respiratory effort. No accessory muscle use.  Cardiovascular: Regular rate and rhythm, no murmurs / rubs / gallops. No extremity edema.  Abdomen: no tenderness, no masses palpated. Bowel sounds positive.  Musculoskeletal: no clubbing / cyanosis. No joint deformity upper and lower extremities. Good ROM, no contractures. Normal muscle tone.  Skin: no rashes, lesions, ulcers. No induration Neurologic: CN 2-12 grossly intact.  Strength 3/5 in lower extremity. Psychiatric: Normal judgment and  insight. Alert and oriented x 3. Normal mood. Data Reviewed:  See HPI  Assessment and Plan: * Symptomatic anemia Baseline between 7-8. Hgb of 6.5 on presentation likely secondary to cancer treatment -transfuse 1u pRBC and follow post H/H -transfuse threshold of <7   Essential hypertension Stable. Continue home meds.  Morbid obesity (HCC) BMI of 32 noted  Brain lesion -CT of the head is concerning for new left intraventricular mass of 1 cm  -pt is okay with proceeding with MRI brain with contrast just to know if his cancer has spread but he absolutely does not want to pursue anymore treatment  Kidney cancer, primary, with metastasis from kidney to other site [facility]) Has been following with Dr. Alen Blew and receiving immunotherapy with Pembrolizumab -pt no longer wants treatment going forward -needs palliative care consult   Hypothyroidism -for the past has had gradual worsening of his TSH most likely due to immunotherapy.  -TSH of 13.871 (10/13) -17.966 (10/24) -pt no longer wants to continue immunotherapy so will not increase his levothyroxine dose      Advance Care Planning:   Code Status: DNR   Consults: none  Family Communication: Pt does not want me to update family. He would like to do this himself  Severity of Illness: The appropriate patient status for this patient is OBSERVATION. Observation status is judged to be reasonable and necessary in order to provide the required intensity of service to ensure the patient's safety. The patient's presenting symptoms, physical exam findings, and initial radiographic and laboratory data in the context of their medical condition is felt to place them at decreased risk for further clinical deterioration. Furthermore, it is anticipated that the patient will be medically stable for discharge from the hospital within 2 midnights of admission.   Author: Orene Desanctis, DO 05/24/2022 10:35 PM  For on call review [email or web address].

## 2022-05-25 ENCOUNTER — Other Ambulatory Visit: Payer: Self-pay

## 2022-05-25 DIAGNOSIS — Z955 Presence of coronary angioplasty implant and graft: Secondary | ICD-10-CM | POA: Diagnosis not present

## 2022-05-25 DIAGNOSIS — E871 Hypo-osmolality and hyponatremia: Secondary | ICD-10-CM | POA: Diagnosis present

## 2022-05-25 DIAGNOSIS — Z7902 Long term (current) use of antithrombotics/antiplatelets: Secondary | ICD-10-CM | POA: Diagnosis not present

## 2022-05-25 DIAGNOSIS — C649 Malignant neoplasm of unspecified kidney, except renal pelvis: Secondary | ICD-10-CM | POA: Diagnosis not present

## 2022-05-25 DIAGNOSIS — K5900 Constipation, unspecified: Secondary | ICD-10-CM | POA: Diagnosis present

## 2022-05-25 DIAGNOSIS — C641 Malignant neoplasm of right kidney, except renal pelvis: Secondary | ICD-10-CM | POA: Diagnosis not present

## 2022-05-25 DIAGNOSIS — L89151 Pressure ulcer of sacral region, stage 1: Secondary | ICD-10-CM | POA: Diagnosis present

## 2022-05-25 DIAGNOSIS — C7951 Secondary malignant neoplasm of bone: Secondary | ICD-10-CM | POA: Diagnosis present

## 2022-05-25 DIAGNOSIS — G939 Disorder of brain, unspecified: Secondary | ICD-10-CM | POA: Diagnosis present

## 2022-05-25 DIAGNOSIS — D6481 Anemia due to antineoplastic chemotherapy: Secondary | ICD-10-CM | POA: Diagnosis present

## 2022-05-25 DIAGNOSIS — I159 Secondary hypertension, unspecified: Secondary | ICD-10-CM | POA: Diagnosis not present

## 2022-05-25 DIAGNOSIS — Z87891 Personal history of nicotine dependence: Secondary | ICD-10-CM | POA: Diagnosis not present

## 2022-05-25 DIAGNOSIS — Z515 Encounter for palliative care: Secondary | ICD-10-CM | POA: Diagnosis not present

## 2022-05-25 DIAGNOSIS — Z66 Do not resuscitate: Secondary | ICD-10-CM | POA: Diagnosis present

## 2022-05-25 DIAGNOSIS — C7800 Secondary malignant neoplasm of unspecified lung: Secondary | ICD-10-CM | POA: Diagnosis present

## 2022-05-25 DIAGNOSIS — Z9221 Personal history of antineoplastic chemotherapy: Secondary | ICD-10-CM | POA: Diagnosis not present

## 2022-05-25 DIAGNOSIS — E119 Type 2 diabetes mellitus without complications: Secondary | ICD-10-CM | POA: Diagnosis present

## 2022-05-25 DIAGNOSIS — Z79890 Hormone replacement therapy (postmenopausal): Secondary | ICD-10-CM | POA: Diagnosis not present

## 2022-05-25 DIAGNOSIS — Z7401 Bed confinement status: Secondary | ICD-10-CM | POA: Diagnosis not present

## 2022-05-25 DIAGNOSIS — D649 Anemia, unspecified: Secondary | ICD-10-CM | POA: Diagnosis present

## 2022-05-25 DIAGNOSIS — E039 Hypothyroidism, unspecified: Secondary | ICD-10-CM | POA: Diagnosis present

## 2022-05-25 DIAGNOSIS — R531 Weakness: Secondary | ICD-10-CM | POA: Diagnosis not present

## 2022-05-25 DIAGNOSIS — Z85528 Personal history of other malignant neoplasm of kidney: Secondary | ICD-10-CM | POA: Diagnosis not present

## 2022-05-25 DIAGNOSIS — F419 Anxiety disorder, unspecified: Secondary | ICD-10-CM | POA: Diagnosis present

## 2022-05-25 DIAGNOSIS — E785 Hyperlipidemia, unspecified: Secondary | ICD-10-CM | POA: Diagnosis present

## 2022-05-25 DIAGNOSIS — I2510 Atherosclerotic heart disease of native coronary artery without angina pectoris: Secondary | ICD-10-CM | POA: Diagnosis present

## 2022-05-25 DIAGNOSIS — C779 Secondary and unspecified malignant neoplasm of lymph node, unspecified: Secondary | ICD-10-CM | POA: Diagnosis present

## 2022-05-25 DIAGNOSIS — L8990 Pressure ulcer of unspecified site, unspecified stage: Secondary | ICD-10-CM | POA: Insufficient documentation

## 2022-05-25 DIAGNOSIS — I119 Hypertensive heart disease without heart failure: Secondary | ICD-10-CM | POA: Diagnosis present

## 2022-05-25 DIAGNOSIS — Z6832 Body mass index (BMI) 32.0-32.9, adult: Secondary | ICD-10-CM | POA: Diagnosis not present

## 2022-05-25 LAB — CBC WITH DIFFERENTIAL/PLATELET
Abs Immature Granulocytes: 0.04 10*3/uL (ref 0.00–0.07)
Basophils Absolute: 0.1 10*3/uL (ref 0.0–0.1)
Basophils Relative: 1 %
Eosinophils Absolute: 0.2 10*3/uL (ref 0.0–0.5)
Eosinophils Relative: 3 %
HCT: 23.4 % — ABNORMAL LOW (ref 39.0–52.0)
Hemoglobin: 6.8 g/dL — CL (ref 13.0–17.0)
Immature Granulocytes: 1 %
Lymphocytes Relative: 11 %
Lymphs Abs: 0.8 10*3/uL (ref 0.7–4.0)
MCH: 23.2 pg — ABNORMAL LOW (ref 26.0–34.0)
MCHC: 29.1 g/dL — ABNORMAL LOW (ref 30.0–36.0)
MCV: 79.9 fL — ABNORMAL LOW (ref 80.0–100.0)
Monocytes Absolute: 0.7 10*3/uL (ref 0.1–1.0)
Monocytes Relative: 9 %
Neutro Abs: 5.6 10*3/uL (ref 1.7–7.7)
Neutrophils Relative %: 75 %
Platelets: 380 10*3/uL (ref 150–400)
RBC: 2.93 MIL/uL — ABNORMAL LOW (ref 4.22–5.81)
RDW: 20 % — ABNORMAL HIGH (ref 11.5–15.5)
WBC: 7.5 10*3/uL (ref 4.0–10.5)
nRBC: 0 % (ref 0.0–0.2)

## 2022-05-25 LAB — GLUCOSE, CAPILLARY: Glucose-Capillary: 119 mg/dL — ABNORMAL HIGH (ref 70–99)

## 2022-05-25 LAB — BASIC METABOLIC PANEL WITH GFR
Anion gap: 10 (ref 5–15)
BUN: 14 mg/dL (ref 8–23)
CO2: 24 mmol/L (ref 22–32)
Calcium: 9.1 mg/dL (ref 8.9–10.3)
Chloride: 100 mmol/L (ref 98–111)
Creatinine, Ser: 1.09 mg/dL (ref 0.61–1.24)
GFR, Estimated: >60 mL/min
Glucose, Bld: 99 mg/dL (ref 70–99)
Potassium: 4.3 mmol/L (ref 3.5–5.1)
Sodium: 134 mmol/L — ABNORMAL LOW (ref 135–145)

## 2022-05-25 LAB — PREPARE RBC (CROSSMATCH)

## 2022-05-25 LAB — HEMOGLOBIN AND HEMATOCRIT, BLOOD
HCT: 24.8 % — ABNORMAL LOW (ref 39.0–52.0)
Hemoglobin: 7.4 g/dL — ABNORMAL LOW (ref 13.0–17.0)

## 2022-05-25 MED ORDER — SODIUM CHLORIDE 0.9% IV SOLUTION: Freq: Once | INTRAVENOUS | Status: AC

## 2022-05-25 MED ORDER — LORAZEPAM 2 MG/ML IJ SOLN
1.0000 mg | INTRAMUSCULAR | Status: DC | PRN
  Administered 2022-05-25 – 2022-05-26 (×3): 1 mg via INTRAVENOUS
  Filled 2022-05-25 (×3): qty 1

## 2022-05-25 MED ORDER — DIAZEPAM 5 MG/ML IJ SOLN
5.0000 mg | Freq: Every day | INTRAMUSCULAR | Status: DC
  Administered 2022-05-25 – 2022-05-26 (×2): 5 mg via INTRAVENOUS
  Filled 2022-05-25 (×2): qty 2

## 2022-05-25 MED ORDER — DIPHENHYDRAMINE-ZINC ACETATE 2-0.1 % EX CREA
TOPICAL_CREAM | CUTANEOUS | Status: AC
  Filled 2022-05-25: qty 28

## 2022-05-25 MED ORDER — DIPHENHYDRAMINE HCL 25 MG PO CAPS
25.0000 mg | ORAL_CAPSULE | Freq: Once | ORAL | Status: AC
  Administered 2022-05-25: 25 mg via ORAL
  Filled 2022-05-25: qty 1

## 2022-05-25 MED ORDER — DIPHENHYDRAMINE HCL 50 MG/ML IJ SOLN
12.5000 mg | Freq: Four times a day (QID) | INTRAMUSCULAR | Status: DC | PRN
  Administered 2022-05-25: 12.5 mg via INTRAVENOUS
  Filled 2022-05-25 (×2): qty 1

## 2022-05-25 MED ORDER — ORAL CARE MOUTH RINSE: 15.0000 mL | OROMUCOSAL | Status: DC | PRN

## 2022-05-25 NOTE — Consult Note (Signed)
Consultation Note Date: 05/25/2022   Patient Name: Thomas Michael  DOB: 1941-05-31  MRN: [id number]  Age / Sex: 81 y.o., male  PCP: [name], MD Referring Physician: Darliss Cheney, MD  Reason for Consultation:   HPI/Patient Profile: 81 y.o. 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 Has undergone right nephrectomy, immunotherapy, and a pathological fx of his left femur following intramedullary implant to prevent complications.   Extensive review of EMR prior to visit.  Presented to bedside for visit and patient observed lethargic but can be aroused. Bedside nurse has initiated 2/2 blood transfusions. Daughter [name] at bedside reports that patient's color looks a little better this morning and he was able to enjoy a few bites of breakfast. Patient agreeable to assessment. He keeps his eyes closed during most of exam and is noted to grimace intermittently; however, does not endorse pain. Respirations are shallow and breath sounds diminished. Skin is pale with slightly jaundiced hue. Bilateral lower extremities are cool to the touch.    Primary Decision Maker Patient remains able to speak for himself and defers to his daughter Shamond Skelton to speak for him. Wife in fragile health and in stays in frequent contact with daughter via phone to assist with decision making.  Discussion: Patient defers care planning to his daughter [name]. Met with [name] outside of patient's room, per their preference. She reports recent, significant decline. One week ago, patient was up with his walker moving between bed and chair. Patient currently bed bound and requiring maximum assist for ADLs. [name] is a death doula by profession and verbalizes understanding of where patient is at in the dying process.  Emotional support provided through active listening and affirmation that she is doing an amazing job of going through this process with her own father. [name] is supportive of her father's decision to stop cancer-directed therapies and wants him to be comfortable during his last days.   Daughter [name] reports that Oncology is aware and has already began working on placement for patient at [facility]. PMT in agreement and available to provide continued support with care coordination.  SUMMARY OF RECOMMENDATIONS   Recommend discharge to hospice inpatient facility. No additional Transfusions. UNABLE to safely take PO-switched to IV valium  Stopped all meds not related to comfort.  Code Status/Advance Care Planning: DNR   Prognosis:   Days to weeks  Discharge Planning: Hospice facility  Primary Diagnoses: Present on Admission:  Symptomatic anemia  Essential hypertension  Hypothyroidism  Morbid obesity ([place])  Kidney cancer, primary, with metastasis from kidney to other site [facility])   Review of Systems  Constitutional:  Positive for activity change.       Bed bound now x 1 week. Denies pain during this visit.  Gastrointestinal:        Endorses poor appetite.    Physical Exam Constitutional:      Appearance: He is ill-appearing.  Comments: Lethargic. Keeps eyes closed during most of exam. Some grimacing noted.  Pulmonary:     Comments: Shallow respirations. Diminished breath sounds. Skin:    Coloration: Skin is jaundiced and pale.     Comments: Bilateral LE cool-to-touch.  Neurological:     Motor: Weakness present.     Comments: Oriented x 4.     Vital Signs: BP (!) 109/53 (BP Location: Left Arm)   Pulse (!) 58   Temp 98.4 F (36.9 C) (Oral)   Resp 18   Wt 90.2 kg   SpO2 97%   BMI 32.09 kg/m  Pain Scale: 0-10   Pain Score: 1    SpO2: SpO2: 97 % O2 Device:SpO2: 97 % O2 Flow Rate: .   IO: Intake/output summary:  Intake/Output Summary  (Last 24 hours) at 05/25/2022 1126 Last data filed at 05/25/2022 0834 Gross per 24 hour  Intake 326 ml  Output 1075 ml  Net -749 ml    LBM: Last BM Date : 05/23/22 Baseline Weight: Weight: 90.2 kg Most recent weight: Weight: 90.2 kg       Thank you for this consult. Palliative medicine will continue to follow and assist as needed.    Signed by: Moss Mc, RN MSN [facility] / NP Student Palliative Medicine    Please contact Palliative Medicine Team phone at [phone] for questions and concerns.  For individual provider: See Shea Evans

## 2022-05-25 NOTE — TOC Initial Note (Signed)
Transition of Michael Thomas Michael) - Initial/Assessment Note    Patient Details  Name: Thomas Michael MRN: [id number] Date of Birth: 1940-10-03  Transition of Michael Thomas Michael) CM/SW Contact:    Thomas Ingles, RN Phone Number:[phone]  05/25/2022, 3:33 PM  Clinical Narrative:                 Thomas Michael acknowledges consult for patient requesting to go to Thomas Michael. CM spoke with daughter Thomas Michael who states that she requested this facility because it is close to her home and patient is insisting that he does not want to return home. Per daughter referral has been sent per Dr. Danise Michael office. CM called Thomas Michael and spoke with Thomas Michael. Thomas Michael confirmed that referral was received on 11/7 and the facility had been reaching out to patient but was unable to contact them . CM has updated the number for the Thomas Michael's information. Per Thomas Michael this facility is for short acute stays for symptom management and then the patient is discharged home. Thomas Michael also states that the facility does have a few residential hospice beds that are private pay for up to 6 weeks at a rate of $275/day with the first two weeks being paid up front. Thomas Michael can not confirm that there are any private hospice beds available at this time.   If patient is in need of symptom management the facility is requesting that medication list and progress notes showing difficult to manage symptoms be faxed to the facility at [phone]. CM will follow up with daughter.           Patient Goals and CMS Choice        Expected Discharge Plan and Services                                                Prior Living Arrangements/Services                       Activities of Daily Living Home Assistive Devices/Equipment: [facility] bed (recliner) ADL Screening (condition at time of admission) Patient's cognitive ability adequate to safely complete daily activities?:  Yes Is the patient deaf or have difficulty hearing?: No Does the patient have difficulty seeing, even when wearing glasses/contacts?: No Does the patient have difficulty concentrating, remembering, or making decisions?: Yes (has had some confusion per computer, pt states he is slow in responding with verbal responses) Patient able to express need for assistance with ADLs?: Yes Does the patient have difficulty dressing or bathing?: Yes Independently performs ADLs?: No Communication: Independent Dressing (OT): Needs assistance Is this a change from baseline?: Pre-admission baseline Grooming: Independent Feeding: Independent, Needs assistance Is this a change from baseline?: Pre-admission baseline Bathing: Needs assistance Is this a change from baseline?: Pre-admission baseline Toileting: Needs assistance Is this a change from baseline?: Pre-admission baseline In/Out Bed: Needs assistance Is this a change from baseline?: Pre-admission baseline Walks in Home: Dependent Is this a change from baseline?: Pre-admission baseline Does the patient have difficulty walking or climbing stairs?: Yes Weakness of Legs: Left (pt states he has severe pain in left leg. He states cancer has ate thru it.) Weakness of Arms/Hands: None  Permission Sought/Granted                  Emotional Assessment  Admission diagnosis:  Symptomatic anemia [D64.9] Patient Active Problem List   Diagnosis Date Noted   Pressure injury of skin 05/25/2022   Hyponatremia 05/25/2022   Symptomatic anemia 05/24/2022   Brain lesion 05/24/2022   Iron deficiency anemia 04/20/2022   Opiate withdrawal ([place]) 10/07/2021   Metastatic cancer to bone ([place]) 10/07/2021   Pathologic fracture of neck of left femur, initial encounter ([place]) 10/01/2021   Kidney cancer, primary, with metastasis from kidney to other site [facility]) 08/28/2021   Bone lesion 07/28/2021   Gait abnormality 08/25/2020   Pain in both lower  extremities 08/25/2020   Facial spasm 09/04/2014   Other generalized ischemic cerebrovascular disease    Unspecified ptosis of eyelid    Facial nerve spasticity    Hemifacial spasm    Pre-op exam 11/19/2010   OBSTRUCTIVE SLEEP APNEA 11/06/2008   Hypothyroidism 09/05/2008   Morbid obesity ([place]) 09/05/2008   RISK OF SLEEP APNEA 09/05/2008   HYPERCHOLESTEROLEMIA 09/04/2008   Essential hypertension 09/04/2008   UNSTABLE ANGINA 09/04/2008   CAD, UNSPECIFIED SITE 09/04/2008   PCP:  [name], MD Pharmacy:   [facility] [address] Phone: [phone] Fax: [phone]  [id number] [place] - [address] [place] [address]Phone: [phone]: [address] - [address] [address]Phone: [phone]: [phone]    Social Determinants of Health (SDOH) Interventions    Readmission Risk Interventions     No data to display

## 2022-05-25 NOTE — Progress Notes (Addendum)
PROGRESS NOTE    Thomas Michael  DTO:[id number] DOB: [birth year] DOA: 05/24/2022 PCP: [name], MD   Brief Narrative:  HPI: Thomas Michael is a 81 y.o. 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    Pt has been having gradual decline in his health that has worsened over the past week. He is so weak he is no longer able to get up from his recliner.Has felt cognitive slowing. Has poor appetite. He does not want to continue to chemotherapy because he feels it will make no difference as he knows his condition is terminal.    In the ED, he was afebrile normotensive heart rate 58.   WBC of 7.8, hemoglobin of 6.5 with baseline around 7-8   Sodium of 134, K of 4.3, creatinine of 1.21, CBG of 117   CT of the head is concerning for new left intraventricular mass of 1 cm with MRI recommended.  Assessment & Plan:   Principal Problem:   Symptomatic anemia Active Problems:   Morbid obesity ([place])   Essential hypertension   Hypothyroidism   Kidney cancer, primary, with metastasis from kidney to other site [facility])   Brain lesion   Pressure injury of skin   Hyponatremia  Symptomatic anemia Baseline between 7-8. Hgb of 6.5 on presentation likely secondary to cancer treatment, received 1 unit of PRBC transfusion, hemoglobin improved to 6.8, will transfuse 1 more unit to keep it over 7.  Essential hypertension: Blood pressure on the low side but he is asymptomatic.  He does not appear to be taking any medications for this.   Morbid obesity (HCC) BMI of 32 noted   Brain lesion -CT of the head is concerning for new left intraventricular mass of 1 cm, confirmed with MRI brain however the patient has made a decision to stop cancer  treatments and he has accepted the fact that this will lead to him passing away.  He told me that his family is also on the same page and he would like to go to hospice facility.  I have consulted palliative care to help with them.  Kidney cancer, primary, with metastasis from kidney to other site [facility]) Has been following with Dr. Alen Blew and receiving immunotherapy with Pembrolizumab -pt no longer wants treatment going forward To care consulted.   Hypothyroidism -for the past has had gradual worsening of his TSH most likely due to immunotherapy.  -TSH of 13.871 (10/13) -17.966 (10/24) -pt no longer wants to continue immunotherapy so will not increase his levothyroxine dose.  Repeat TSH in 4 weeks if he wishes.  Mild hypeonatremia: Monitor.  He is asymptomatic.   DVT prophylaxis: SCDs Start: 05/24/22 2212   Code Status: DNR  Family Communication:  None present at bedside.  Plan of care discussed with patient in length and he/she verbalized understanding and agreed with it.  I spoke to his daughter over the phone.  Status is: Observation The patient will require care spanning > 2 midnights and should be moved to inpatient because: Anemia, needs blood transfusion.   Estimated body mass index is 32.09 kg/m as calculated from the following:   Height as of 05/11/22: '5\' 6"'$  (1.676 m).   Weight as of this encounter: 90.2 kg.  Pressure Injury 05/24/22 Sacrum Mid Stage 1 -  Intact skin with non-blanchable redness of a localized area usually over a bony prominence. non-blanchable redness to sacrum/foam patch applied (Active)  05/24/22 2030  Location: Sacrum  Location Orientation: Mid  Staging: Stage 1 -  Intact skin with non-blanchable redness of a localized area usually over a bony prominence.  Wound Description (Comments): non-blanchable redness to sacrum/foam patch applied  Present on Admission: Yes   Nutritional Assessment: Body mass index is 32.09 kg/m.Marland Kitchen Seen by dietician.  I agree  with the assessment and plan as outlined below: Nutrition Status:        . Skin Assessment: I have examined the patient's skin and I agree with the wound assessment as performed by the wound care RN as outlined below: Pressure Injury 05/24/22 Sacrum Mid Stage 1 -  Intact skin with non-blanchable redness of a localized area usually over a bony prominence. non-blanchable redness to sacrum/foam patch applied (Active)  05/24/22 2030  Location: Sacrum  Location Orientation: Mid  Staging: Stage 1 -  Intact skin with non-blanchable redness of a localized area usually over a bony prominence.  Wound Description (Comments): non-blanchable redness to sacrum/foam patch applied  Present on Admission: Yes    Consultants:  Palliative care  Procedures:  None  Antimicrobials:  Anti-infectives (From admission, onward)    None         Subjective: Patient seen and examined.  He is fully alert and oriented.  Has no complaints.  He tells me that he has not had as good of breakfast as he had today.  He was very happy about that.  Objective: Vitals:   05/25/22 0833 05/25/22 1015 05/25/22 1027 05/25/22 1056  BP: (!) 112/54 (!) 100/50 (!) 100/52 (!) 109/53  Pulse: (!) 59 60 60 (!) 58  Resp: 18 18    Temp: 98.4 F (36.9 C) 98.1 F (36.7 C)  98.4 F (36.9 C)  TempSrc: Oral Oral  Oral  SpO2: 96% 96%  97%  Weight:        Intake/Output Summary (Last 24 hours) at 05/25/2022 1106 Last data filed at 05/25/2022 0834 Gross per 24 hour  Intake 326 ml  Output 1075 ml  Net -749 ml   Filed Weights   05/24/22 2031  Weight: 90.2 kg    Examination:  General exam: Appears calm and comfortable  Respiratory system: Clear to auscultation. Respiratory effort normal. Cardiovascular system: S1 & S2 heard, RRR. No JVD, murmurs, rubs, gallops or clicks. No pedal edema. Gastrointestinal system: Abdomen is nondistended, soft and nontender. No organomegaly or masses felt. Normal bowel sounds  heard. Central nervous system: Alert and oriented. No focal neurological deficits. Extremities: Symmetric 5 x 5 power. Skin: No rashes, lesions or ulcers Psychiatry: Judgement and insight appear normal. Mood & affect appropriate.    Data Reviewed: I have personally reviewed following labs and imaging studies  CBC: Recent Labs  Lab 05/24/22 1705 05/25/22 0710  WBC 7.8 7.5  NEUTROABS 6.0 5.6  HGB 6.5* 6.8*  HCT 23.1* 23.4*  MCV 79.4* 79.9*  PLT 350 151   Basic Metabolic Panel: Recent Labs  Lab 05/24/22 1705 05/25/22 0710  NA 134* 134*  K 4.3 4.3  CL 102 100  CO2 25 24  GLUCOSE 117* 99  BUN 17 14  CREATININE 1.21 1.09  CALCIUM 8.7* 9.1  MG 2.1  --    GFR: Estimated Creatinine Clearance: 56.9 mL/min (by C-G formula based on SCr of 1.09 mg/dL). Liver Function Tests: Recent Labs  Lab 05/24/22 1705  AST  29  ALT 25  ALKPHOS 83  BILITOT 0.5  PROT 6.5  ALBUMIN 2.4*   No results for input(s): "LIPASE", "AMYLASE" in the last 168 hours. No results for input(s): "AMMONIA" in the last 168 hours. Coagulation Profile: No results for input(s): "INR", "PROTIME" in the last 168 hours. Cardiac Enzymes: No results for input(s): "CKTOTAL", "CKMB", "CKMBINDEX", "TROPONINI" in the last 168 hours. BNP (last 3 results) No results for input(s): "PROBNP" in the last 8760 hours. HbA1C: No results for input(s): "HGBA1C" in the last 72 hours. CBG: Recent Labs  Lab 05/24/22 2033  GLUCAP 99   Lipid Profile: No results for input(s): "CHOL", "HDL", "LDLCALC", "TRIG", "CHOLHDL", "LDLDIRECT" in the last 72 hours. Thyroid Function Tests: No results for input(s): "TSH", "T4TOTAL", "FREET4", "T3FREE", "THYROIDAB" in the last 72 hours. Anemia Panel: No results for input(s): "VITAMINB12", "FOLATE", "FERRITIN", "TIBC", "IRON", "RETICCTPCT" in the last 72 hours. Sepsis Labs: Recent Labs  Lab 05/24/22 1705  LATICACIDVEN 1.3    No results found for this or any previous visit (from the  past 240 hour(s)).   Radiology Studies: MR BRAIN W WO CONTRAST  Result Date: 05/25/2022 CLINICAL DATA:  History of metastatic cancer with new lesion seen on head CT EXAM: MRI HEAD WITHOUT AND WITH CONTRAST TECHNIQUE: Multiplanar, multiecho pulse sequences of the brain and surrounding structures were obtained without and with intravenous contrast. CONTRAST:  25m GADAVIST GADOBUTROL 1 MMOL/ML IV SOLN COMPARISON:  Prior MRI, correlation made with CT head 05/24/2022 and 11/24/2005 FINDINGS: Brain: In the atrium of the left lateral ventricle, there is a 10 x 10 x 11 mm homogeneously hyperenhancing mass (series 15, image 81 and series 17, image 10). Additional possible enhancing nodule at the left foramen of Luschka near the cerebellopontine angle (series 15, image 44), measuring approximately 4 mm. No evidence of resulting hydrocephalus. The ventricles have increased in size compared to 2007, favored to be related to age-related cerebral atrophy. No restricted diffusion to suggest acute or subacute infarct. No acute hemorrhage, mass effect, or midline shift. No extra-axial collection. Vascular: Normal arterial flow voids. Normal arterial and venous enhancement. Skull and upper cervical spine: Normal marrow signal. Sinuses/Orbits: No acute finding. Other: The mastoids are well aerated. IMPRESSION: 1. Homogeneously hyperenhancing lesion atrium of the left lateral ventricle measuring up to 11 mm, with an additional similarly hyperenhancing lesion at the left foramen of Luschka/cerebellopontine angle. These are both nonspecific and could represent lesions of choroid plexus origin, given their location and enhancement, possibly atypical choroid plexus papillomas, although other CNS lesions or metastatic disease cannot be excluded. No evidence of resulting hydrocephalus. 2. No evidence of acute or subacute infarct. Electronically Signed   By: AMerilyn BabaM.D.   On: 05/25/2022 03:33   CT Head Wo Contrast  Result  Date: 05/24/2022 CLINICAL DATA:  Mental status change, unknown cause approximately 1 cm rounded soft tissue EXAM: CT HEAD WITHOUT CONTRAST TECHNIQUE: Contiguous axial images were obtained from the base of the skull through the vertex without intravenous contrast. RADIATION DOSE REDUCTION: This exam was performed according to the departmental dose-optimization program which includes automated exposure control, adjustment of the mA and/or kV according to patient size and/or use of iterative reconstruction technique. COMPARISON:  CT head May 9, 227. FINDINGS: Brain: No evidence of acute infarction, hemorrhage, hydrocephalus, extra-axial collection. Approximately 1 cm possible intraventricular mass in the atrium of the left lateral ventricle. Patchy white matter hypodensities, which are nonspecific but compatible with chronic microvascular ischemic disease. Vascular: No hyperdense vessel  identified. Skull: No acute fracture. Sinuses/Orbits: No acute finding. Other: No mastoid effusions. IMPRESSION: 1. Approximately 1 cm possible intraventricular mass in the atrium of the left lateral ventricle, which appears new from the prior. An MRI with contrast could further evaluate if clinically warranted. 2. Otherwise, no evidence of acute intracranial abnormality. Electronically Signed   By: Margaretha Sheffield M.D.   On: 05/24/2022 17:31   DG Chest Port 1 View  Result Date: 05/24/2022 CLINICAL DATA:  Weakness EXAM: PORTABLE CHEST 1 VIEW COMPARISON:  10/06/2021 FINDINGS: Cardiomegaly with central vascular congestion. No consolidation, pleural effusion, or pneumothorax. IMPRESSION: Cardiomegaly with mild central vascular congestion. Electronically Signed   By: Donavan Foil M.D.   On: 05/24/2022 17:20    Scheduled Meds:  cholecalciferol  2,000 Units Oral Q1200   clonazePAM  1 mg Oral QHS   ferrous sulfate  325 mg Oral BID   levothyroxine  200 mcg Oral QAC breakfast   niacin  500 mg Oral Q1200   omega-3 acid ethyl esters   1 g Oral Daily   rOPINIRole  0.25 mg Oral BID   trazodone  300 mg Oral QHS   vitamin E  400 Units Oral Q1200   Continuous Infusions:   LOS: 0 days   Darliss Cheney, MD [facility]  05/25/2022, 11:06 AM   *Please note that this is a verbal dictation therefore any spelling or grammatical errors are due to the "[place]" system interpretation.  Please page via [place] and do not message via secure chat for urgent patient care matters. Secure chat can be used for non urgent patient care matters.  How to contact the [facility] Attending or Consulting provider [place] or covering provider during after hours [place], for this patient?  Check the care team in [facility] and look for a) attending/consulting [facility] provider listed and b) the [facility] team listed. Page or secure chat [phone]. Log into [email or web address] and use [place]'s universal password to access. If you do not have the password, please contact the hospital operator. Locate the [facility] provider you are looking for under [facility] and page to a number that you can be directly reached. If you still have difficulty reaching the provider, please page the [facility] (Director on Call) for the Hospitalists listed on amion for assistance.

## 2022-05-26 ENCOUNTER — Encounter (HOSPITAL_COMMUNITY): Payer: Self-pay | Admitting: Family Medicine

## 2022-05-26 DIAGNOSIS — D649 Anemia, unspecified: Secondary | ICD-10-CM | POA: Diagnosis not present

## 2022-05-26 DIAGNOSIS — Z66 Do not resuscitate: Secondary | ICD-10-CM | POA: Diagnosis present

## 2022-05-26 DIAGNOSIS — C649 Malignant neoplasm of unspecified kidney, except renal pelvis: Secondary | ICD-10-CM | POA: Diagnosis not present

## 2022-05-26 LAB — CBC WITH DIFFERENTIAL/PLATELET
Abs Immature Granulocytes: 0.04 10*3/uL (ref 0.00–0.07)
Basophils Absolute: 0.1 10*3/uL (ref 0.0–0.1)
Basophils Relative: 1 %
Eosinophils Absolute: 0.1 10*3/uL (ref 0.0–0.5)
Eosinophils Relative: 1 %
HCT: 25.9 % — ABNORMAL LOW (ref 39.0–52.0)
Hemoglobin: 7.8 g/dL — ABNORMAL LOW (ref 13.0–17.0)
Immature Granulocytes: 0 %
Lymphocytes Relative: 9 %
Lymphs Abs: 1 10*3/uL (ref 0.7–4.0)
MCH: 23.8 pg — ABNORMAL LOW (ref 26.0–34.0)
MCHC: 30.1 g/dL (ref 30.0–36.0)
MCV: 79 fL — ABNORMAL LOW (ref 80.0–100.0)
Monocytes Absolute: 0.9 10*3/uL (ref 0.1–1.0)
Monocytes Relative: 9 %
Neutro Abs: 8.2 10*3/uL — ABNORMAL HIGH (ref 1.7–7.7)
Neutrophils Relative %: 80 %
Platelets: 384 10*3/uL (ref 150–400)
RBC: 3.28 MIL/uL — ABNORMAL LOW (ref 4.22–5.81)
RDW: 20.3 % — ABNORMAL HIGH (ref 11.5–15.5)
WBC: 10.3 10*3/uL (ref 4.0–10.5)
nRBC: 0 % (ref 0.0–0.2)

## 2022-05-26 LAB — TYPE AND SCREEN
ABO/RH(D): A POS
Antibody Screen: NEGATIVE
Unit division: 0
Unit division: 0

## 2022-05-26 LAB — BPAM RBC
Blood Product Expiration Date: 202311262359
Blood Product Expiration Date: 202311272359
ISSUE DATE / TIME: 202311070025
ISSUE DATE / TIME: 202311071030
Unit Type and Rh: 6200
Unit Type and Rh: 6200

## 2022-05-26 MED ORDER — SENNA 8.6 MG PO TABS
1.0000 | ORAL_TABLET | Freq: Two times a day (BID) | ORAL | Status: DC
  Administered 2022-05-26 – 2022-05-27 (×3): 8.6 mg via ORAL
  Filled 2022-05-26 (×3): qty 1

## 2022-05-26 MED ORDER — DOCUSATE SODIUM 100 MG PO CAPS: 200.0000 mg | ORAL_CAPSULE | Freq: Every day | ORAL | Status: DC | PRN

## 2022-05-26 MED ORDER — MORPHINE SULFATE (PF) 2 MG/ML IV SOLN: 2.0000 mg | INTRAVENOUS | Status: DC | PRN

## 2022-05-26 MED ORDER — POLYETHYLENE GLYCOL 3350 17 G PO PACK
17.0000 g | PACK | Freq: Every day | ORAL | Status: DC
  Filled 2022-05-26: qty 1

## 2022-05-26 NOTE — TOC Progression Note (Addendum)
Transition of Care [facility]) - Progression Note    Patient Details  Name: Thomas Michael MRN: [id number] Date of Birth: 1940-11-13  Transition of Care [facility]) CM/SW [place], RN Phone Number:[phone]  05/26/2022, 11:43 AM  Clinical Narrative:    CM spoke with daughter patricia to explain that inpatient hospice is for symptom management and then home. Also updated that long term hospice bed may be available but for self pay. Daughter states that family is unable to pay. CM called Hospice to determine what option are available for this patient. Medicare will not pay for any long term care or SNF if patient is not rehab. Hospice can follow patient at home and will provide DME but no caregivers. Hospice is unable to follow patient at SNF because medicare will not pay for hospice services and SNF. If patient is to go to any facility the family will need to pay room and board. Daughter [name] at bedside has been made aware of above information. Per daughter she will discuss the cost of residential hospice with patients wife because patient is adamant that he does not want to go home. Daughter to call CM back when family reached a decision.   10 Daughter states that family will take patient home with Hospice to follow. Message sent to MD for Hospice referral. Cm offered daughter choice. Daughter has no preference, Hospice referral has been called to [facility] with [facility].         Expected Discharge Plan and Services                                                 Social Determinants of Health (SDOH) Interventions    Readmission Risk Interventions     No data to display

## 2022-05-26 NOTE — Progress Notes (Signed)
PROGRESS NOTE    Thomas Michael  FUX:[id number] DOB: Jan 28, 1941 DOA: 05/24/2022 PCP: [name], MD   Brief Narrative:  HPI: Thomas Michael is a 81 y.o. 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    Pt has been having gradual decline in his health that has worsened over the past week. He is so weak he is no longer able to get up from his recliner.Has felt cognitive slowing. Has poor appetite. He does not want to continue to chemotherapy because he feels it will make no difference as he knows his condition is terminal.    In the ED, he was afebrile normotensive heart rate 58.   WBC of 7.8, hemoglobin of 6.5 with baseline around 7-8   Sodium of 134, K of 4.3, creatinine of 1.21, CBG of 117   CT of the head is concerning for new left intraventricular mass of 1 cm with MRI recommended.  Assessment & Plan:   Principal Problem:   Symptomatic anemia Active Problems:   Morbid obesity ([place])   Essential hypertension   Hypothyroidism   Kidney cancer, primary, with metastasis from kidney to other site [facility])   Brain lesion   Pressure injury of skin   Hyponatremia  Symptomatic anemia Baseline between 7-8. Hgb of 6.5 on presentation likely secondary to cancer treatment, he received total of 2 units of PRBC transfusion, posttransfusion hemoglobin 7.5.  Repeating again today.  No signs of bleeding.    Essential hypertension: Blood pressure on the low side but he is asymptomatic.  He does not appear to be taking any medications for this.   Morbid obesity (HCC) BMI of 32 noted   Brain lesion -CT of the head is concerning for new left intraventricular mass of 1 cm, confirmed with MRI brain however the patient has made a  decision to stop cancer treatments and he has accepted the fact that this will lead to him passing away.  He told me that his family is also on the same page and he would like to go to hospice facility.  Seen by palliative care, all medications discontinued other than comfort medications, plan to transfer him to hospice facility once bed is available.  Kidney cancer, primary, with metastasis from kidney to other site [facility]) Has been following with Dr. Alen Blew and receiving immunotherapy with Pembrolizumab -pt no longer wants treatment going forward.  Very for transfer to hospice facility.   Hypothyroidism -for the past has had gradual worsening of his TSH most likely due to immunotherapy.  -TSH of 13.871 (10/13) -17.966 (10/24) -pt no longer wants to continue immunotherapy so will not increase his levothyroxine dose.    Mild hypeonatremia: Monitor.  He is asymptomatic.  Constipation: We will resume home medications which include Colace, senna and MiraLAX.   DVT prophylaxis: SCDs Start: 05/24/22 2212   Code Status: DNR  Family Communication:  None present at bedside.  Plan of care discussed with patient in length and he/she verbalized understanding and agreed with it.  I spoke to his daughter over the phone.  Status is: Inpatient Remains inpatient appropriate because: Pending placement to hospice facility.     Estimated body mass index is 32.09 kg/m as calculated from the following:   Height as of 05/11/22: '5\' 6"'$  (1.676 m).  Weight as of this encounter: 90.2 kg.  Pressure Injury 05/24/22 Sacrum Mid Stage 1 -  Intact skin with non-blanchable redness of a localized area usually over a bony prominence. non-blanchable redness to sacrum/foam patch applied (Active)  05/24/22 2030  Location: Sacrum  Location Orientation: Mid  Staging: Stage 1 -  Intact skin with non-blanchable redness of a localized area usually over a bony prominence.  Wound Description (Comments): non-blanchable redness  to sacrum/foam patch applied  Present on Admission: Yes  Dressing Type Foam - Lift dressing to assess site every shift 05/25/22 2000   Nutritional Assessment: Body mass index is 32.09 kg/m.Marland Kitchen Seen by dietician.  I agree with the assessment and plan as outlined below: Nutrition Status:        . Skin Assessment: I have examined the patient's skin and I agree with the wound assessment as performed by the wound care RN as outlined below: Pressure Injury 05/24/22 Sacrum Mid Stage 1 -  Intact skin with non-blanchable redness of a localized area usually over a bony prominence. non-blanchable redness to sacrum/foam patch applied (Active)  05/24/22 2030  Location: Sacrum  Location Orientation: Mid  Staging: Stage 1 -  Intact skin with non-blanchable redness of a localized area usually over a bony prominence.  Wound Description (Comments): non-blanchable redness to sacrum/foam patch applied  Present on Admission: Yes  Dressing Type Foam - Lift dressing to assess site every shift 05/25/22 2000    Consultants:  Palliative care  Procedures:  None  Antimicrobials:  Anti-infectives (From admission, onward)    None         Subjective:  Patient seen and examined.  Complains of constipation.  Requesting to resume home medications.  No other complaint.  Objective: Vitals:   05/25/22 1345 05/25/22 2125 05/26/22 0625 05/26/22 0954  BP: (!) 101/58 128/62 (!) 123/59 (!) 109/53  Pulse: 63 63 66 (!) 57  Resp: '18 18 18 18  '$ Temp: 97.6 F (36.4 C) (!) 97.5 F (36.4 C) 98.6 F (37 C) 98.1 F (36.7 C)  TempSrc: Oral Oral Oral Oral  SpO2: 100% 93% 95% 99%  Weight:        Intake/Output Summary (Last 24 hours) at 05/26/2022 1151 Last data filed at 05/26/2022 1007 Gross per 24 hour  Intake 715 ml  Output 350 ml  Net 365 ml    Filed Weights   05/24/22 2031  Weight: 90.2 kg    Examination:  General exam: Appears calm and comfortable  Respiratory system: Clear to auscultation.  Respiratory effort normal. Cardiovascular system: S1 & S2 heard, RRR. No JVD, murmurs, rubs, gallops or clicks. No pedal edema. Gastrointestinal system: Abdomen is nondistended, soft and nontender. No organomegaly or masses felt. Normal bowel sounds heard. Central nervous system: Alert and oriented. No focal neurological deficits. Extremities: Symmetric 5 x 5 power. Skin: No rashes, lesions or ulcers.  Psychiatry: Judgement and insight appear normal. Mood & affect appropriate.   Data Reviewed: I have personally reviewed following labs and imaging studies  CBC: Recent Labs  Lab 05/24/22 1705 05/25/22 0710 05/25/22 2107  WBC 7.8 7.5  --   NEUTROABS 6.0 5.6  --   HGB 6.5* 6.8* 7.4*  HCT 23.1* 23.4* 24.8*  MCV 79.4* 79.9*  --   PLT 350 380  --     Basic Metabolic Panel: Recent Labs  Lab 05/24/22 1705 05/25/22 0710  NA 134* 134*  K 4.3 4.3  CL 102 100  CO2 25 24  GLUCOSE 117* 99  BUN 17 14  CREATININE 1.21 1.09  CALCIUM 8.7* 9.1  MG 2.1  --     GFR: Estimated Creatinine Clearance: 56.9 mL/min (by C-G formula based on SCr of 1.09 mg/dL). Liver Function Tests: Recent Labs  Lab 05/24/22 1705  AST 29  ALT 25  ALKPHOS 83  BILITOT 0.5  PROT 6.5  ALBUMIN 2.4*    No results for input(s): "LIPASE", "AMYLASE" in the last 168 hours. No results for input(s): "AMMONIA" in the last 168 hours. Coagulation Profile: No results for input(s): "INR", "PROTIME" in the last 168 hours. Cardiac Enzymes: No results for input(s): "CKTOTAL", "CKMB", "CKMBINDEX", "TROPONINI" in the last 168 hours. BNP (last 3 results) No results for input(s): "PROBNP" in the last 8760 hours. HbA1C: No results for input(s): "HGBA1C" in the last 72 hours. CBG: Recent Labs  Lab 05/24/22 2033 05/25/22 2125  GLUCAP 99 119*    Lipid Profile: No results for input(s): "CHOL", "HDL", "LDLCALC", "TRIG", "CHOLHDL", "LDLDIRECT" in the last 72 hours. Thyroid Function Tests: No results for input(s):  "TSH", "T4TOTAL", "FREET4", "T3FREE", "THYROIDAB" in the last 72 hours. Anemia Panel: No results for input(s): "VITAMINB12", "FOLATE", "FERRITIN", "TIBC", "IRON", "RETICCTPCT" in the last 72 hours. Sepsis Labs: Recent Labs  Lab 05/24/22 1705  LATICACIDVEN 1.3     No results found for this or any previous visit (from the past 240 hour(s)).   Radiology Studies: MR BRAIN W WO CONTRAST  Result Date: 05/25/2022 CLINICAL DATA:  History of metastatic cancer with new lesion seen on head CT EXAM: MRI HEAD WITHOUT AND WITH CONTRAST TECHNIQUE: Multiplanar, multiecho pulse sequences of the brain and surrounding structures were obtained without and with intravenous contrast. CONTRAST:  49m GADAVIST GADOBUTROL 1 MMOL/ML IV SOLN COMPARISON:  Prior MRI, correlation made with CT head 05/24/2022 and 11/24/2005 FINDINGS: Brain: In the atrium of the left lateral ventricle, there is a 10 x 10 x 11 mm homogeneously hyperenhancing mass (series 15, image 81 and series 17, image 10). Additional possible enhancing nodule at the left foramen of Luschka near the cerebellopontine angle (series 15, image 44), measuring approximately 4 mm. No evidence of resulting hydrocephalus. The ventricles have increased in size compared to 2007, favored to be related to age-related cerebral atrophy. No restricted diffusion to suggest acute or subacute infarct. No acute hemorrhage, mass effect, or midline shift. No extra-axial collection. Vascular: Normal arterial flow voids. Normal arterial and venous enhancement. Skull and upper cervical spine: Normal marrow signal. Sinuses/Orbits: No acute finding. Other: The mastoids are well aerated. IMPRESSION: 1. Homogeneously hyperenhancing lesion atrium of the left lateral ventricle measuring up to 11 mm, with an additional similarly hyperenhancing lesion at the left foramen of Luschka/cerebellopontine angle. These are both nonspecific and could represent lesions of choroid plexus origin, given their  location and enhancement, possibly atypical choroid plexus papillomas, although other CNS lesions or metastatic disease cannot be excluded. No evidence of resulting hydrocephalus. 2. No evidence of acute or subacute infarct. Electronically Signed   By: AMerilyn BabaM.D.   On: 05/25/2022 03:33   CT Head Wo Contrast  Result Date: 05/24/2022 CLINICAL DATA:  Mental status change, unknown cause approximately 1 cm rounded soft tissue EXAM: CT HEAD WITHOUT CONTRAST TECHNIQUE: Contiguous axial images were obtained from the base of the skull through the vertex without intravenous contrast. RADIATION DOSE REDUCTION: This exam was performed according to the departmental dose-optimization program which includes automated exposure control, adjustment of the mA and/or kV according to patient size and/or use of iterative  reconstruction technique. COMPARISON:  CT head May 9, 227. FINDINGS: Brain: No evidence of acute infarction, hemorrhage, hydrocephalus, extra-axial collection. Approximately 1 cm possible intraventricular mass in the atrium of the left lateral ventricle. Patchy white matter hypodensities, which are nonspecific but compatible with chronic microvascular ischemic disease. Vascular: No hyperdense vessel identified. Skull: No acute fracture. Sinuses/Orbits: No acute finding. Other: No mastoid effusions. IMPRESSION: 1. Approximately 1 cm possible intraventricular mass in the atrium of the left lateral ventricle, which appears new from the prior. An MRI with contrast could further evaluate if clinically warranted. 2. Otherwise, no evidence of acute intracranial abnormality. Electronically Signed   By: Margaretha Sheffield M.D.   On: 05/24/2022 17:31   DG Chest Port 1 View  Result Date: 05/24/2022 CLINICAL DATA:  Weakness EXAM: PORTABLE CHEST 1 VIEW COMPARISON:  10/06/2021 FINDINGS: Cardiomegaly with central vascular congestion. No consolidation, pleural effusion, or pneumothorax. IMPRESSION: Cardiomegaly with mild  central vascular congestion. Electronically Signed   By: Donavan Foil M.D.   On: 05/24/2022 17:20    Scheduled Meds:  diazepam  5 mg Intravenous QHS   polyethylene glycol  17 g Oral Daily   senna  1 tablet Oral BID   traZODone  300 mg Oral QHS   Continuous Infusions:   LOS: 1 day   Darliss Cheney, MD [facility]  05/26/2022, 11:51 AM   *Please note that this is a verbal dictation therefore any spelling or grammatical errors are due to the "[place] [employer]" system interpretation.  Please page via [place] and do not message via secure chat for urgent patient care matters. Secure chat can be used for non urgent patient care matters.  How to contact the [facility] Attending or Consulting provider [place] or covering provider during after hours [place], for this patient?  Check the care team in [facility] and look for a) attending/consulting TRH provider listed and b) the [facility] team listed. Page or secure chat [phone]. Log into [email or web address] and use [place]'s universal password to access. If you do not have the password, please contact the hospital operator. Locate the [facility] provider you are looking for under [facility] and page to a number that you can be directly reached. If you still have difficulty reaching the provider, please page the [facility] (Director on Call) for the Hospitalists listed on amion for assistance.

## 2022-05-26 NOTE — Progress Notes (Signed)
Palliative Care Progress Note  81 y.o. 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 Has undergone right nephrectomy, immunotherapy, and a pathological fx of his left femur following intramedullary implant to prevent complications.Patient admitted with worsening confusion and weakness.   Palliative Care consult on 11/7--> transitioned to COMFORT MEASURES.   Patient is unable to care for himself at home, he is taking in <10% of his meals, unable to swallow PO medications.   I anticipate his prognosis is <2 weeks given his current condition related to frailty and metastatic disease.  He is appropriate for hospice care, but does not have caregivers at home, does not have the resources to pay for a LTC w/hospice and is not having difficult to mange EOL symptoms to qualify him for a Hospice IPU bed, although his needs could change at any time as his terminal progression occurs.   He has IV pain PRN meds and is receiving IV valium scheduled QHS for seizure prophylaxis, anxiety, sleep and muscle spasm.  Await decision about Hospice Facility-I anticipate his status to require ongoing IV meds, frequent monitoring and higher levels of symptom management as he nears end of life.  Medication list reviewed.    Thomas Hacker, DO Palliative Medicine  Time: 20 minutes

## 2022-05-27 ENCOUNTER — Other Ambulatory Visit (HOSPITAL_COMMUNITY): Payer: Self-pay

## 2022-05-27 DIAGNOSIS — D649 Anemia, unspecified: Secondary | ICD-10-CM | POA: Diagnosis not present

## 2022-05-27 MED ORDER — HYDROCODONE-ACETAMINOPHEN 5-325 MG PO TABS
1.0000 | ORAL_TABLET | ORAL | 0 refills | Status: DC | PRN
  Filled 2022-05-27: qty 6, 1d supply, fill #0

## 2022-05-27 MED ORDER — HYDROCODONE-ACETAMINOPHEN 5-325 MG PO TABS: 1.0000 | ORAL_TABLET | ORAL | 0 refills | Status: AC | PRN

## 2022-05-27 MED ORDER — HYDROCODONE-ACETAMINOPHEN 5-325 MG PO TABS
2.0000 | ORAL_TABLET | ORAL | 0 refills | Status: DC | PRN
  Filled 2022-05-27: qty 15, 2d supply, fill #0

## 2022-05-27 NOTE — TOC Transition Note (Signed)
Transition of Care [facility]) - CM/SW Discharge Note   Patient Details  Name: FARZAD TIBBETTS MRN: [id number] Date of Birth: [birth year]  Transition of Care [facility]) CM/SW Contact:  Angelita Ingles, RN Phone [phone]  05/27/2022, 12:11 PM   Clinical Narrative:    Patient discharging home with Hospice to follow. Daughter has been updated and ,ade aware. Daughter [name] states that everything has been set up and there are no other needs at this time. Transportation has been arranged per [facility]AR. D/c packet is at nurses station. No other needs noted at this time.    Final next level of care: Home w Hospice Care Barriers to Discharge: No Barriers Identified   Patient Goals and CMS Choice   CMS Medicare.gov Compare Post Acute Care list provided to:: Patient Represenative (must comment) Choice offered to / list presented to : Adult Children  Discharge Placement                Patient to be transferred to facility by: [place] Name of family member notified: Junie Spencer Patient and family notified of of transfer: 05/27/22  Discharge Plan and Services                DME Arranged: N/A DME Agency: NA       HH Arranged: NA HH Agency: NA        Social Determinants of Health (SDOH) Interventions     Readmission Risk Interventions     No data to display

## 2022-05-27 NOTE — Discharge Summary (Signed)
Physician Discharge Summary  Thomas Michael UGQ:[id number] DOB: May 24, 1941 DOA: 05/24/2022  PCP: [name], MD  Admit date: 05/24/2022 Discharge date: 05/27/2022 30 Day Unplanned Readmission Risk Score    Flowsheet Row ED to Hosp-Admission (Current) from 05/24/2022 in [place] [facility]  30 Day Unplanned Readmission Risk Score (%) 22.86 Filed at 05/27/2022 0801       This score is the patient's risk of an unplanned readmission within 30 days of being discharged (0 -100%). The score is based on dignosis, age, lab data, medications, orders, and past utilization.   Low:  0-14.9   Medium: 15-21.9   High: 22-29.9   Extreme: 30 and above          Admitted From: Home Disposition: Home with hospice  Recommendations for Outpatient Follow-up:  Follow up with PCP in 1-2 weeks Please obtain BMP/CBC in one week Please follow up with your PCP on the following pending results: Unresulted Labs (From admission, onward)    None         Home Health: None Equipment/Devices: Multiple DME  Discharge Condition: Fair CODE STATUS: DNR Diet recommendation: Cardiac  Subjective: Patient seen and examined.  He has no complaints.  He knows that he is going home with hospice and he is okay with that.  HPI: Thomas Michael is a 81 y.o. 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    Pt has been having gradual decline in his health that has worsened over the past week. He is so weak he is no longer able to get up from his recliner.Has felt cognitive slowing. Has poor appetite. He does not want to continue to chemotherapy because he feels it will make no difference as he knows his condition is terminal.    In the ED, he was  afebrile normotensive heart rate 58.   WBC of 7.8, hemoglobin of 6.5 with baseline around 7-8   Sodium of 134, K of 4.3, creatinine of 1.21, CBG of 117   CT of the head is concerning for new left intraventricular mass of 1 cm with MRI recommended.  Brief/Interim Summary:   Symptomatic anemia Baseline between 7-8. Hgb of 6.5 on presentation likely secondary to cancer treatment, he received total of 2 units of PRBC transfusion, posttransfusion hemoglobin 7.5.  No signs of bleeding.  He was seen by palliative care and was transitioned to comfort care.  Initial plan was to transfer to inpatient hospice facility but he did not qualify for that and eventually palliative care discussed with the family and plan is to discharge him home with hospice.  He has received all the equipment that he needs.  Family is aware.   Essential hypertension: Blood pressure on the low side but he is asymptomatic.  He does not appear to be taking any medications for this.   Morbid obesity (HCC) BMI of 32 noted   Brain lesion -CT of the head is concerning for new left intraventricular mass of 1 cm, confirmed with MRI brain however the patient has made a decision to stop cancer treatments and he has accepted the fact that this will lead to him passing away.  He told me that his family is also on the same page and he would like to go to hospice facility.  Seen by palliative care, all medications discontinued other than comfort medications,   Kidney cancer, primary, with metastasis from kidney to other site [facility]) Has been following with Dr. Alen Blew and receiving immunotherapy with Pembrolizumab -pt no longer wants treatment going forward.  Very for transfer to hospice facility.   Hypothyroidism -for the past has had gradual worsening of his TSH most likely due to immunotherapy.  -TSH of 13.871 (10/13) -17.966 (10/24) -pt no longer wants to continue immunotherapy so will not increase his levothyroxine dose.     Mild  hypeonatremia: Monitor.  He is asymptomatic.   Discharge plan was discussed with patient and/or family member and they verbalized understanding and agreed with it.  Discharge Diagnoses:  Principal Problem:   Symptomatic anemia Active Problems:   Morbid obesity ([place])   Essential hypertension   Hypothyroidism   Kidney cancer, primary, with metastasis from kidney to other site [facility])   Brain lesion   Pressure injury of skin   Hyponatremia   DNR (do not resuscitate)    Discharge Instructions   Allergies as of 05/27/2022       Reactions   Oxycodone    Other reaction(s): vomiting   Promethazine-codeine Other (See Comments)   Pt does not remember reaction        Medication List     TAKE these medications    Accu-Chek Aviva Plus w/Device Kit check CBG   Accu-Chek FastClix Lancets Misc check CBG   acetaminophen 500 MG tablet Commonly known as: TYLENOL Take 1,000 mg by mouth every 8 (eight) hours as needed for moderate pain.   camphor-menthol lotion Commonly known as: SARNA Apply 1 Application topically as needed for itching.   chlorpheniramine-HYDROcodone 10-8 MG/5ML Commonly known as: TUSSIONEX Take 5 mLs by mouth at bedtime as needed for cough.   Cholecalciferol 50 MCG (2000 UT) Tabs Take 2,000 Units by mouth daily at 12 noon.   ciclopirox 8 % solution Commonly known as: PENLAC Apply 1 application topically daily as needed (fungus (toes)). Apply over nail and surrounding skin. Apply daily as needed over previous coat.   clonazePAM 1 MG tablet Commonly known as: KlonoPIN Take 1 tablet (1 mg total) by mouth at bedtime.   diphenhydrAMINE 25 MG tablet Commonly known as: BENADRYL Take 50 mg by mouth at bedtime. For itching   docusate sodium 100 MG capsule Commonly known as: COLACE Take 200 mg by mouth daily as needed for mild constipation.   ferrous sulfate 325 (65 FE) MG tablet Take 1 tablet (325 mg total) by mouth 2 (two) times daily.   FISH OIL  PO Take 1,000 mg by mouth daily at 12 noon.   HYDROcodone-acetaminophen 5-325 MG tablet Commonly known as: Norco Take 1 tablet by mouth every 4 (four) hours as needed for moderate pain. What changed: You were already taking a medication with the same name, and this prescription was added. Make sure you understand how and when to take each.   HYDROcodone-acetaminophen 5-325 MG tablet Commonly known as: Norco Take 2 tablets by mouth every 4 (four) hours as needed for moderate pain. What changed: Another medication with the same name was added. Make sure you understand how and when to take each.   Inlyta 5 MG tablet Generic drug: axitinib Take 1 tablet (5 mg total) by mouth daily.   levothyroxine 200 MCG tablet Commonly known as: SYNTHROID Take 200 mcg by mouth every evening.   lidocaine 4 % external solution Commonly known as: XYLOCAINE Apply topically as needed  for mild pain or moderate pain. [id number] -- 4% Lidocaine HLC spray   magic mouthwash w/lidocaine Soln Take 5 mLs by mouth 4 (four) times daily. 5 ml QID PRN mouth sores, swish and spit   metoprolol succinate 50 MG 24 hr tablet Commonly known as: TOPROL-XL Take 50 mg by mouth in the morning.   multivitamin with minerals Tabs tablet Take 1 tablet by mouth daily at 12 noon.   niacin 500 MG CR capsule Take 500 mg by mouth daily at 12 noon.   nystatin-triamcinolone ointment Commonly known as: MYCOLOG Apply 1 application. topically 2 (two) times daily.   polyethylene glycol 17 g packet Commonly known as: MiraLax Mix-In Pax Take 17 g by mouth daily.   rOPINIRole 0.25 MG tablet Commonly known as: REQUIP Take 0.25 mg by mouth 2 (two) times daily.   senna 8.6 MG Tabs tablet Commonly known as: SENOKOT Take 1 tablet (8.6 mg total) by mouth 2 (two) times daily.   trazodone 300 MG tablet Commonly known as: DESYREL Take 300 mg by mouth at bedtime.   valsartan-hydrochlorothiazide 160-12.5 MG tablet Commonly known as:  DIOVAN-HCT Take 1 tablet by mouth in the morning.   Victoza 18 MG/3ML Sopn Generic drug: liraglutide Inject 1.2 mg into the skin every evening.   vitamin E 180 MG (400 UNITS) capsule Take 400 Units by mouth daily at 12 noon.        Follow-up Information     [name], MD Follow up in 1 week(s).   Specialty: Family Medicine Contact information: [address] [place] [address] [phone]                Allergies  Allergen Reactions   Oxycodone     Other reaction(s): vomiting   Promethazine-Codeine Other (See Comments)    Pt does not remember reaction    Consultations: Palliative care   Procedures/Studies: MR BRAIN W WO CONTRAST  Result Date: 05/25/2022 CLINICAL DATA:  History of metastatic cancer with new lesion seen on head CT EXAM: MRI HEAD WITHOUT AND WITH CONTRAST TECHNIQUE: Multiplanar, multiecho pulse sequences of the brain and surrounding structures were obtained without and with intravenous contrast. CONTRAST:  108m GADAVIST GADOBUTROL 1 MMOL/ML IV SOLN COMPARISON:  Prior MRI, correlation made with CT head 05/24/2022 and 11/24/2005 FINDINGS: Brain: In the atrium of the left lateral ventricle, there is a 10 x 10 x 11 mm homogeneously hyperenhancing mass (series 15, image 81 and series 17, image 10). Additional possible enhancing nodule at the left foramen of Luschka near the cerebellopontine angle (series 15, image 44), measuring approximately 4 mm. No evidence of resulting hydrocephalus. The ventricles have increased in size compared to 2007, favored to be related to age-related cerebral atrophy. No restricted diffusion to suggest acute or subacute infarct. No acute hemorrhage, mass effect, or midline shift. No extra-axial collection. Vascular: Normal arterial flow voids. Normal arterial and venous enhancement. Skull and upper cervical spine: Normal marrow signal. Sinuses/Orbits: No acute finding. Other: The mastoids are well aerated.  IMPRESSION: 1. Homogeneously hyperenhancing lesion atrium of the left lateral ventricle measuring up to 11 mm, with an additional similarly hyperenhancing lesion at the left foramen of Luschka/cerebellopontine angle. These are both nonspecific and could represent lesions of choroid plexus origin, given their location and enhancement, possibly atypical choroid plexus papillomas, although other CNS lesions or metastatic disease cannot be excluded. No evidence of resulting hydrocephalus. 2. No evidence of acute or subacute infarct. Electronically Signed   By: ABryson Ha  Vasan M.D.   On: 05/25/2022 03:33   CT Head Wo Contrast  Result Date: 05/24/2022 CLINICAL DATA:  Mental status change, unknown cause approximately 1 cm rounded soft tissue EXAM: CT HEAD WITHOUT CONTRAST TECHNIQUE: Contiguous axial images were obtained from the base of the skull through the vertex without intravenous contrast. RADIATION DOSE REDUCTION: This exam was performed according to the departmental dose-optimization program which includes automated exposure control, adjustment of the mA and/or kV according to patient size and/or use of iterative reconstruction technique. COMPARISON:  CT head May 9, 227. FINDINGS: Brain: No evidence of acute infarction, hemorrhage, hydrocephalus, extra-axial collection. Approximately 1 cm possible intraventricular mass in the atrium of the left lateral ventricle. Patchy white matter hypodensities, which are nonspecific but compatible with chronic microvascular ischemic disease. Vascular: No hyperdense vessel identified. Skull: No acute fracture. Sinuses/Orbits: No acute finding. Other: No mastoid effusions. IMPRESSION: 1. Approximately 1 cm possible intraventricular mass in the atrium of the left lateral ventricle, which appears new from the prior. An MRI with contrast could further evaluate if clinically warranted. 2. Otherwise, no evidence of acute intracranial abnormality. Electronically Signed   By: Margaretha Sheffield M.D.   On: 05/24/2022 17:31   DG Chest Port 1 View  Result Date: 05/24/2022 CLINICAL DATA:  Weakness EXAM: PORTABLE CHEST 1 VIEW COMPARISON:  10/06/2021 FINDINGS: Cardiomegaly with central vascular congestion. No consolidation, pleural effusion, or pneumothorax. IMPRESSION: Cardiomegaly with mild central vascular congestion. Electronically Signed   By: Donavan Foil M.D.   On: 05/24/2022 17:20   CT Chest W Contrast  Result Date: 05/05/2022 CLINICAL DATA:  Metastatic renal cell carcinoma restaging, status post right nephrectomy * Tracking Code: BO * EXAM: CT CHEST WITH CONTRAST CT ABDOMEN AND PELVIS WITHOUT AND WITH CONTRAST TECHNIQUE: Multidetector CT imaging of the abdomen and pelvis was performed without intravenous contrast. Multidetector CT imaging of the chest, abdomen, and pelvis was then performed during bolus administration of intravenous contrast. RADIATION DOSE REDUCTION: This exam was performed according to the departmental dose-optimization program which includes automated exposure control, adjustment of the mA and/or kV according to patient size and/or use of iterative reconstruction technique. CONTRAST:  118m OMNIPAQUE IOHEXOL 300 MG/ML  SOLN COMPARISON:  12/25/2021, 10/06/2021 FINDINGS: CT CHEST WITH CONTRAST Cardiovascular: Aortic atherosclerosis. Cardiomegaly. Three-vessel coronary artery calcifications. No pericardial effusion. Mediastinum/Nodes: Unchanged enlarged AP window and left hilar lymph nodes, largest nodes measuring up to 2.7 x 2.4 cm (series 1, image 48). Small hiatal hernia. Trachea and esophagus demonstrate no significant findings. Lungs/Pleura: Examination of the lungs is somewhat limited by breath motion artifact throughout. Within this limitation, diffuse bilateral bronchial wall thickening. Probable mild fibrosis in a pattern without slight apical to basal gradient, featuring irregular peripheral interstitial opacity and septal thickening, without clear evidence  of subpleural bronchiolectasis or honeycombing. Multiple small bilateral pulmonary nodules are unchanged, largest again in the dependent left lower lobe measuring 1.3 x 0.9 cm (series 3, image 83) and in the lateral segment right middle lobe measuring 1.3 x 0.9 cm (series 3, image 86). Additional smaller nodules scattered throughout, for example a 0.5 cm nodule of the peripheral left lower lobe (series 3, image 105). No new nodules. Trace bilateral pleural effusions, unchanged. Musculoskeletal: No significant change in an enhancing soft tissue nodule in the subcutaneous soft tissues overlying the left scapular body measuring 1.6 x 1.2 cm (series 1, image 41). No acute osseous findings. Disc degenerative disease and bridging osteophytosis throughout the thoracic spine, in keeping with DISH. CT ABDOMEN WITHOUT  AND WITH CONTRAST Hepatobiliary: No solid liver abnormality is seen. Hepatic steatosis. Status post cholecystectomy. No biliary ductal dilatation. Pancreas: Unremarkable. No pancreatic ductal dilatation or surrounding inflammatory changes. Spleen: Normal in size without significant abnormality. Adrenals/Urinary Tract: Multiple unchanged arterially hyperenhancing adrenal nodules, largest in body of the left adrenal gland measuring 2.0 x 1.9 cm (series 7, image 20). The lateral limb of the left adrenal gland measuring 1.8 x 1.3 cm (series 7, image 41), largest in the lateral limb of the right adrenal gland measuring 0.3 x 1.4 cm (series 7, image 47). Status post right nephrectomy. No suspicious soft tissue or contrast enhancement in the nephrectomy bed. The left kidney is normal, without renal calculi, solid lesion, or hydronephrosis. Stomach/Bowel: Stomach is within normal limits. Diverticulum of the descending duodenal. No evidence of bowel wall thickening, distention, or inflammatory changes. Vascular/Lymphatic: Aortic atherosclerosis. No enlarged abdominal or pelvic lymph nodes. Other: No abdominal wall hernia  or abnormality. No ascites. Musculoskeletal: Status post intramedullary nail fixation of the left femur. Significant interval increase in size of a soft tissue mass arising from the intratrochanteric left femur and proximal diaphysis, measuring 10.5 x 9.8 cm, previously 6.4 x 4.8 cm (series 7, image 152). 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 IMPRESSION: 1. Status post right nephrectomy. No suspicious soft tissue or contrast enhancement in the nephrectomy bed. 2. Multiple unchanged small bilateral pulmonary nodules. 3. Unchanged enlarged AP window and left hilar lymph nodes. 4. Multiple unchanged bilateral adrenal nodules. 5. There has however been significant interval enlargement of a soft tissue mass arising from the intratrochanteric left femur and proximal left femoral diaphysis consistent with a worsened metastasis. 6. Unchanged enhancing soft tissue nodule in the subcutaneous fat overlying the left scapula. 7. No evidence of new metastatic disease in the chest, abdomen, or pelvis. 8. Cardiomegaly and coronary artery disease. Aortic Atherosclerosis (ICD10-I70.0). Electronically Signed   By: Delanna Ahmadi M.D.   On: 05/05/2022 17:53   CT Abdomen Pelvis W Wo Contrast  Result Date: 05/05/2022 CLINICAL DATA:  Metastatic renal cell carcinoma restaging, status post right nephrectomy * Tracking Code: BO * EXAM: CT CHEST WITH CONTRAST CT ABDOMEN AND PELVIS WITHOUT AND WITH CONTRAST TECHNIQUE: Multidetector CT imaging of the abdomen and pelvis was performed without intravenous contrast. Multidetector CT imaging of the chest, abdomen, and pelvis was then performed during bolus administration of intravenous contrast. RADIATION DOSE REDUCTION: This exam was performed according to the departmental dose-optimization program which includes automated exposure control, adjustment of the mA and/or kV according to patient size and/or use of iterative reconstruction technique.  CONTRAST:  125m OMNIPAQUE IOHEXOL 300 MG/ML  SOLN COMPARISON:  12/25/2021, 10/06/2021 FINDINGS: CT CHEST WITH CONTRAST Cardiovascular: Aortic atherosclerosis. Cardiomegaly. Three-vessel coronary artery calcifications. No pericardial effusion. Mediastinum/Nodes: Unchanged enlarged AP window and left hilar lymph nodes, largest nodes measuring up to 2.7 x 2.4 cm (series 1, image 48). Small hiatal hernia. Trachea and esophagus demonstrate no significant findings. Lungs/Pleura: Examination of the lungs is somewhat limited by breath motion artifact throughout. Within this limitation, diffuse bilateral bronchial wall thickening. Probable mild fibrosis in a pattern without slight apical to basal gradient, featuring irregular peripheral interstitial opacity and septal thickening, without clear evidence of subpleural bronchiolectasis or honeycombing. Multiple small bilateral pulmonary nodules are unchanged, largest again in the dependent left lower lobe measuring 1.3 x 0.9 cm (series 3, image 83) and in the lateral segment right middle lobe measuring 1.3 x 0.9 cm (series 3, image  86). Additional smaller nodules scattered throughout, for example a 0.5 cm nodule of the peripheral left lower lobe (series 3, image 105). No new nodules. Trace bilateral pleural effusions, unchanged. Musculoskeletal: No significant change in an enhancing soft tissue nodule in the subcutaneous soft tissues overlying the left scapular body measuring 1.6 x 1.2 cm (series 1, image 41). No acute osseous findings. Disc degenerative disease and bridging osteophytosis throughout the thoracic spine, in keeping with DISH. CT ABDOMEN WITHOUT AND WITH CONTRAST Hepatobiliary: No solid liver abnormality is seen. Hepatic steatosis. Status post cholecystectomy. No biliary ductal dilatation. Pancreas: Unremarkable. No pancreatic ductal dilatation or surrounding inflammatory changes. Spleen: Normal in size without significant abnormality. Adrenals/Urinary Tract:  Multiple unchanged arterially hyperenhancing adrenal nodules, largest in body of the left adrenal gland measuring 2.0 x 1.9 cm (series 7, image 20). The lateral limb of the left adrenal gland measuring 1.8 x 1.3 cm (series 7, image 41), largest in the lateral limb of the right adrenal gland measuring 0.3 x 1.4 cm (series 7, image 47). Status post right nephrectomy. No suspicious soft tissue or contrast enhancement in the nephrectomy bed. The left kidney is normal, without renal calculi, solid lesion, or hydronephrosis. Stomach/Bowel: Stomach is within normal limits. Diverticulum of the descending duodenal. No evidence of bowel wall thickening, distention, or inflammatory changes. Vascular/Lymphatic: Aortic atherosclerosis. No enlarged abdominal or pelvic lymph nodes. Other: No abdominal wall hernia or abnormality. No ascites. Musculoskeletal: Status post intramedullary nail fixation of the left femur. Significant interval increase in size of a soft tissue mass arising from the intratrochanteric left femur and proximal diaphysis, measuring 10.5 x 9.8 cm, previously 6.4 x 4.8 cm (series 7, image 152). 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 IMPRESSION: 1. Status post right nephrectomy. No suspicious soft tissue or contrast enhancement in the nephrectomy bed. 2. Multiple unchanged small bilateral pulmonary nodules. 3. Unchanged enlarged AP window and left hilar lymph nodes. 4. Multiple unchanged bilateral adrenal nodules. 5. There has however been significant interval enlargement of a soft tissue mass arising from the intratrochanteric left femur and proximal left femoral diaphysis consistent with a worsened metastasis. 6. Unchanged enhancing soft tissue nodule in the subcutaneous fat overlying the left scapula. 7. No evidence of new metastatic disease in the chest, abdomen, or pelvis. 8. Cardiomegaly and coronary artery disease. Aortic Atherosclerosis (ICD10-I70.0).  Electronically Signed   By: Delanna Ahmadi M.D.   On: 05/05/2022 17:53     Discharge Exam: Vitals:   05/27/22 0816 05/27/22 0816  BP: 124/65 124/65  Pulse: 61 61  Resp: 18 18  Temp: 98.2 F (36.8 C) 98.2 F (36.8 C)  SpO2: 93% 93%   Vitals:   05/26/22 2013 05/27/22 0426 05/27/22 0816 05/27/22 0816  BP: (!) 114/57 123/61 124/65 124/65  Pulse: 60 63 61 61  Resp: _0 Temp: 98.5 F (36.9 C) 99 F (37.2 C) 98.2 F (36.8 C) 98.2 F (36.8 C)  TempSrc: Oral Oral Oral Oral  SpO2: 94% 91% 93% 93%  Weight:        General: Pt is alert, awake, not in acute distress Cardiovascular: RRR, S1/S2 +, no rubs, no gallops Respiratory: CTA bilaterally, no wheezing, no rhonchi Abdominal: Soft, NT, ND, bowel sounds + Extremities: no edema, no cyanosis    The results of significant diagnostics from this hospitalization (including imaging, microbiology, ancillary and laboratory) are listed below for reference.     Microbiology: No results found for this or  any previous visit (from the past 240 hour(s)).   Labs: BNP (last 3 results) No results for input(s): "BNP" in the last 8760 hours. Basic Metabolic Panel: Recent Labs  Lab 05/24/22 1705 05/25/22 0710  NA 134* 134*  K 4.3 4.3  CL 102 100  CO2 25 24  GLUCOSE 117* 99  BUN 17 14  CREATININE 1.21 1.09  CALCIUM 8.7* 9.1  MG 2.1  --    Liver Function Tests: Recent Labs  Lab 05/24/22 1705  AST 29  ALT 25  ALKPHOS 83  BILITOT 0.5  PROT 6.5  ALBUMIN 2.4*   No results for input(s): "LIPASE", "AMYLASE" in the last 168 hours. No results for input(s): "AMMONIA" in the last 168 hours. CBC: Recent Labs  Lab 05/24/22 1705 05/25/22 0710 05/25/22 2107 05/26/22 1215  WBC 7.8 7.5  --  10.3  NEUTROABS 6.0 5.6  --  8.2*  HGB 6.5* 6.8* 7.4* 7.8*  HCT 23.1* 23.4* 24.8* 25.9*  MCV 79.4* 79.9*  --  79.0*  PLT 350 380  --  384   Cardiac Enzymes: No results for input(s): "CKTOTAL", "CKMB", "CKMBINDEX", "TROPONINI" in the  last 168 hours. BNP: Invalid input(s): "POCBNP" CBG: Recent Labs  Lab 05/24/22 2033 05/25/22 2125  GLUCAP 99 119*   D-Dimer No results for input(s): "DDIMER" in the last 72 hours. Hgb A1c No results for input(s): "HGBA1C" in the last 72 hours. Lipid Profile No results for input(s): "CHOL", "HDL", "LDLCALC", "TRIG", "CHOLHDL", "LDLDIRECT" in the last 72 hours. Thyroid function studies No results for input(s): "TSH", "T4TOTAL", "T3FREE", "THYROIDAB" in the last 72 hours.  Invalid input(s): "FREET3" Anemia work up No results for input(s): "VITAMINB12", "FOLATE", "FERRITIN", "TIBC", "IRON", "RETICCTPCT" in the last 72 hours. Urinalysis    Component Value Date/Time   COLORURINE YELLOW 05/24/2022 1858   APPEARANCEUR CLEAR 05/24/2022 1858   LABSPEC 1.015 05/24/2022 1858   PHURINE 5.0 05/24/2022 1858   GLUCOSEU NEGATIVE 05/24/2022 1858   HGBUR NEGATIVE 05/24/2022 1858   BILIRUBINUR NEGATIVE 05/24/2022 1858   KETONESUR NEGATIVE 05/24/2022 1858   PROTEINUR NEGATIVE 05/24/2022 1858   UROBILINOGEN 1.0 10/28/2010 1932   NITRITE NEGATIVE 05/24/2022 1858   LEUKOCYTESUR NEGATIVE 05/24/2022 1858   Sepsis Labs Recent Labs  Lab 05/24/22 1705 05/25/22 0710 05/26/22 1215  WBC 7.8 7.5 10.3   Microbiology No results found for this or any previous visit (from the past 240 hour(s)).   Time coordinating discharge: Over 30 minutes  SIGNED:   Darliss Cheney, MD  [facility] 05/27/2022, 10:50 AM *Please note that this is a verbal dictation therefore any spelling or grammatical errors are due to the "[employer] [employer]" system interpretation. If 7PM-7AM, please contact night-coverage [email or web address]

## 2022-05-27 NOTE — Progress Notes (Signed)
Pt. Daughter called and informed that Yvone Neu is leaving with transport and on his way to the house.  AVS in discharge packet, PIV removed.

## 2022-05-28 ENCOUNTER — Other Ambulatory Visit (HOSPITAL_COMMUNITY): Payer: Self-pay

## 2022-05-30 ENCOUNTER — Other Ambulatory Visit: Payer: Self-pay

## 2022-05-30 ENCOUNTER — Emergency Department (HOSPITAL_COMMUNITY)
Admission: EM | Admit: 2022-05-30 | Discharge: 2022-05-30 | Disposition: A | Discharge status: Home / Self Care | Payer: Medicare Other | Attending: Emergency Medicine | Admitting: Emergency Medicine

## 2022-05-30 DIAGNOSIS — C7900 Secondary malignant neoplasm of unspecified kidney and renal pelvis: Secondary | ICD-10-CM | POA: Diagnosis not present

## 2022-05-30 DIAGNOSIS — C799 Secondary malignant neoplasm of unspecified site: Secondary | ICD-10-CM

## 2022-05-30 DIAGNOSIS — Z7401 Bed confinement status: Secondary | ICD-10-CM | POA: Diagnosis not present

## 2022-05-30 DIAGNOSIS — R29898 Other symptoms and signs involving the musculoskeletal system: Secondary | ICD-10-CM | POA: Diagnosis not present

## 2022-05-30 DIAGNOSIS — T68XXXA Hypothermia, initial encounter: Secondary | ICD-10-CM | POA: Diagnosis not present

## 2022-05-30 DIAGNOSIS — I959 Hypotension, unspecified: Secondary | ICD-10-CM | POA: Diagnosis not present

## 2022-05-30 DIAGNOSIS — R627 Adult failure to thrive: Secondary | ICD-10-CM | POA: Insufficient documentation

## 2022-05-30 DIAGNOSIS — R531 Weakness: Secondary | ICD-10-CM | POA: Diagnosis not present

## 2022-05-30 DIAGNOSIS — S79929A Unspecified injury of unspecified thigh, initial encounter: Secondary | ICD-10-CM | POA: Diagnosis not present

## 2022-05-30 DIAGNOSIS — R6251 Failure to thrive (child): Secondary | ICD-10-CM

## 2022-05-30 DIAGNOSIS — R0689 Other abnormalities of breathing: Secondary | ICD-10-CM | POA: Diagnosis not present

## 2022-05-30 MED ORDER — HYDROCODONE-ACETAMINOPHEN 5-325 MG PO TABS: 1.0000 | ORAL_TABLET | Freq: Four times a day (QID) | ORAL | Status: DC | PRN

## 2022-05-30 MED ORDER — ROCURONIUM BROMIDE 10 MG/ML (PF) SYRINGE
PREFILLED_SYRINGE | INTRAVENOUS | Status: AC
  Filled 2022-05-30: qty 10

## 2022-05-30 MED ORDER — MIDAZOLAM HCL 2 MG/2ML IJ SOLN
INTRAMUSCULAR | Status: AC
  Filled 2022-05-30: qty 2

## 2022-05-30 MED ORDER — FENTANYL CITRATE PF 50 MCG/ML IJ SOSY
PREFILLED_SYRINGE | INTRAMUSCULAR | Status: AC
  Filled 2022-05-30: qty 2

## 2022-05-30 MED ORDER — EPINEPHRINE 1 MG/10ML IJ SOSY
PREFILLED_SYRINGE | INTRAMUSCULAR | Status: AC
  Filled 2022-05-30: qty 10

## 2022-05-30 MED ORDER — ETOMIDATE 2 MG/ML IV SOLN
INTRAVENOUS | Status: AC
  Filled 2022-05-30: qty 20

## 2022-05-30 MED ORDER — SUCCINYLCHOLINE CHLORIDE 200 MG/10ML IV SOSY
PREFILLED_SYRINGE | INTRAVENOUS | Status: AC
  Filled 2022-05-30: qty 10

## 2022-05-30 NOTE — Progress Notes (Deleted)
TOC CSW attempted to contact pts daughter, Haynes Dage to obtain [place] pt is with.  CSW left HIPPA compliant message with my contact information @ 3:42pm and 4:07pm.  '@4'$ :13pm CSW contacted [facility]) [phone] to verify pt being with them.  CSW spoke with Pamala Hurry, RN who stated pt was not currently a patient of theirs.  '@4'$ :27pm  CSW made another attempt to contact pts daughter Haynes Dage.  CSW left HIPPA compliant message with my contact information.  Matthan Sledge Tarpley-Carter, MSW, LCSW-A Pronouns:  She/Her/Hers [facility] Clinical Social Worker Direct Number:  [phone] [email or web address]

## 2022-05-30 NOTE — Progress Notes (Signed)
TOC CSW attempted to contact pts daughter, Haynes Dage to obtain [place] pt is with.  CSW left HIPPA compliant message with my contact information @ 3:42pm and 4:07pm.  '@4'$ :13pm CSW contacted [facility]) [phone] to verify pt being with them.  CSW spoke with Pamala Hurry, RN who stated pt was not currently a patient of theirs.  '@4'$ :27pm  CSW made another attempt to contact pts daughter Haynes Dage.  CSW left HIPPA compliant message with my contact information.  Jayle Solarz Tarpley-Carter, MSW, LCSW-A Pronouns:  She/Her/Hers [facility] Clinical Social Worker Direct Number:  [phone] [email or web address]

## 2022-05-30 NOTE — ED Triage Notes (Signed)
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 Pain rated 8/10. Patient was able to walk with walker 2 weeks ago, now is only able to stand with recliner , unable to walk

## 2022-05-30 NOTE — Progress Notes (Signed)
TOC CSW attempted to contact Dr. Kathrynn Humble at [phone].  Which was incorrect #.  Thomas Michael, MSW, LCSW-A Pronouns:  She/Her/Hers [facility] Clinical Social Worker Direct Number:  [phone] [email or web address]

## 2022-05-30 NOTE — ED Provider Notes (Signed)
[facility] DEPT Provider Note   CSN: [id number] Arrival date & time: 05/30/22  1414     History  No chief complaint on file.   Thomas Michael is a 81 y.o. male.  HPI    81 year old male with history of metastatic kidney cancer with recurrence comes in with chief complaint of pain and weakness.  Patient was just discharged from the hospital 2 days ago.  He was enrolled in hospice at the time of discharge.  Patient is no longer getting aggressive cancer therapy.  Patient states that after being discharged, he has noted that he is weak and not able to perform ADLs such as going to the bathroom.  He was just enrolled in hospice, but they only come 3 days a week.  Daughter is at the bedside later in patient's stay.  She states that patient lives with his wife, and she is not able to manage on the days that hospice is not around.  Simple tasks like going to the bathroom is difficult.  Patient has high risk for falls.  He we will have to lay in soiled clothes for hours.  She is wondering if patient is eligible for more resources.  Social work notes reviewed with the family.  It appears that patient has financial constraints, what is offered is at no cost, if they want residential hospice, SNF placement then their insurance will not cover those services and they will have to pay out-of-pocket.  If they also want additional home health services, it appears that it will cost money.  Family is wondering if there is home health available for more days.  Patient has no other complaints.  He states that with the Norco that was prescribed, his pain does go down to 2 or 3 out of 10. Home Medications Prior to Admission medications   Medication Sig Start Date End Date Taking? Authorizing Provider  Accu-Chek FastClix Lancets MISC check CBG 02/07/17   [provider]  acetaminophen (TYLENOL) 500 MG tablet Take 1,000 mg by mouth every 8 (eight) hours as needed  for moderate pain.    [provider]  axitinib (INLYTA) 5 MG tablet Take 1 tablet (5 mg total) by mouth daily. 08/28/21   Wyatt Portela, MD  Blood Glucose Monitoring Suppl (ACCU-CHEK AVIVA PLUS) w/Device KIT check CBG 02/07/17   [provider]  camphor-menthol Timoteo Ace) lotion Apply 1 Application topically as needed for itching.    [provider]  chlorpheniramine-HYDROcodone 10-8 MG/5ML Take 5 mLs by mouth at bedtime as needed for cough. Patient not taking: Reported on 05/24/2022 11/27/21   Wyatt Portela, MD  Cholecalciferol 50 MCG (2000 UT) TABS Take 2,000 Units by mouth daily at 12 noon.    [provider]  ciclopirox (PENLAC) 8 % solution Apply 1 application topically daily as needed (fungus (toes)). Apply over nail and surrounding skin. Apply daily as needed over previous coat.    [provider]  clonazePAM (KLONOPIN) 1 MG tablet Take 1 tablet (1 mg total) by mouth at bedtime. 05/11/22   Wyatt Portela, MD  diphenhydrAMINE (BENADRYL) 25 MG tablet Take 50 mg by mouth at bedtime. For itching    [provider]  docusate sodium (COLACE) 100 MG capsule Take 200 mg by mouth daily as needed for mild constipation.    [provider]  ferrous sulfate 325 (65 FE) MG tablet Take 1 tablet (325 mg total) by mouth 2 (two) times daily. 01/26/22  Wyatt Portela, MD  HYDROcodone-acetaminophen (NORCO/VICODIN) 5-325 MG tablet Take 1 tablet by mouth every 4 (four) hours as needed for moderate pain. 05/27/22 05/27/23  Darliss Cheney, MD  levothyroxine (SYNTHROID, LEVOTHROID) 200 MCG tablet Take 200 mcg by mouth every evening.    [provider]  lidocaine (XYLOCAINE) 4 % external solution Apply topically as needed for mild pain or moderate pain. JA250 -- 4% Lidocaine HLC spray    [provider]  magic mouthwash w/lidocaine SOLN Take 5 mLs by mouth 4 (four) times daily. 5 ml QID PRN mouth sores, swish and spit 12/31/21   Wyatt Portela, MD  metoprolol (TOPROL-XL) 50 MG 24 hr tablet Take 50 mg by mouth in the morning. Patient not taking: Reported on 05/24/2022    [provider]  Multiple Vitamin (MULTIVITAMIN WITH MINERALS) TABS tablet Take 1 tablet by mouth daily at 12 noon.    [provider]  niacin 500 MG CR capsule Take 500 mg by mouth daily at 12 noon.    [provider]  nystatin-triamcinolone ointment (MYCOLOG) Apply 1 application. topically 2 (two) times daily. 11/19/21   Wyatt Portela, MD  Omega-3 Fatty Acids (FISH OIL PO) Take 1,000 mg by mouth daily at 12 noon.    [provider]  polyethylene glycol (MIRALAX MIX-IN PAX) 17 g packet Take 17 g by mouth daily. 10/08/21   Shelly Coss, MD  rOPINIRole (REQUIP) 0.25 MG tablet Take 0.25 mg by mouth 2 (two) times daily.    [provider]  senna (SENOKOT) 8.6 MG TABS tablet Take 1 tablet (8.6 mg total) by mouth 2 (two) times daily. 10/08/21   Shelly Coss, MD  trazodone (DESYREL) 300 MG tablet Take 300 mg by mouth at bedtime.    [provider]  valsartan-hydrochlorothiazide (DIOVAN-HCT) 160-12.5 MG per tablet Take 1 tablet by mouth in the morning. Patient not taking: Reported on 05/24/2022 05/24/14   [provider]  VICTOZA 18 MG/3ML SOPN Inject 1.2 mg into the skin every evening. 03/30/17   [provider]  vitamin E 400 UNIT capsule Take 400 Units by mouth daily at 12 noon.    [provider]      Allergies    Oxycodone and Promethazine-codeine    Review of Systems   Review of Systems  Physical Exam Updated Vital Signs BP 106/77 (BP Location: Left Arm)   Pulse 64   Temp 97.8 F (36.6 C) (Oral)   Resp 20   Ht _0  (1.676 m)   Wt 90.2 kg   SpO2 96%   BMI 32.09 kg/m  Physical Exam Vitals and nursing note reviewed.  Constitutional:      Appearance: He is well-developed.  HENT:     Head: Atraumatic.  Cardiovascular:     Rate and Rhythm: Normal rate.  Pulmonary:      Effort: Pulmonary effort is normal.  Musculoskeletal:     Cervical back: Neck supple.  Skin:    General: Skin is warm.  Neurological:     Mental Status: He is alert and oriented to person, place, and time.     ED Results / Procedures / Treatments   Labs (all labs ordered are listed, but only abnormal results are displayed) Labs Reviewed - No data to display  EKG None  Radiology No results found.  Procedures Procedures    Medications Ordered in ED Medications  HYDROcodone-acetaminophen (NORCO/VICODIN) 5-325 MG per tablet 1 tablet (has no administration in time range)  ED Course/ Medical Decision Making/ A&P                           Medical Decision Making Risk Prescription drug management.   81 year old male with metastatic renal cancer comes in with chief complaint of pain and failure to thrive.  It appears that patient is no longer getting cancer treatment.  Prognosis is not clear, it is not imminent death as of yet.  He is eligible for residential hospice when he is at that stage, but it has been determined that he is not there yet.  There are financial and insurance constraints involved, leading to patient being only eligible for hospice residential help 3 days a week.  It seems like that might not be adequate in terms of what family is requiring.  Patient however just discharged 2 or 3 days ago and is in the same predicament as he was 2 or 3 days ago.  There is no new information that might change the plan.  I gave the daughter the option of staying in the ER and getting social work consultation, but waiting for several hours to be that completed and not getting any additional help or for patient to be discharged with hospice team calling them tomorrow upon our request to see if more assistance is available.  They have preferred the second option.  Pain medicine as needed ordered.  Stable for discharge.  Recommended simple therapy such as taking urinal with  them, using depends.  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

## 2022-05-30 NOTE — Progress Notes (Addendum)
TOC CSW reached out to [facility] to make them aware of pt being in the [facility] ED.  CSW left contact information for a return call.  Leeta Grimme Tarpley-Carter, MSW, LCSW-A Pronouns:  She/Her/Hers [facility] of Care Clinical Social Worker Direct Number:  [phone] [email or web address]

## 2022-05-30 NOTE — ED Provider Triage Note (Signed)
Emergency Medicine Provider Triage Evaluation Note  TWAN HARKIN , a 81 y.o. male  was evaluated in triage.  Pt complains of ***.  Review of Systems  Positive: *** Negative: ***  Physical Exam  BP 106/77 (BP Location: Left Arm)   Pulse 64   Temp 97.8 F (36.6 C) (Oral)   Resp 20   Ht '5\' 6"'$  (1.676 m)   Wt 90.2 kg   SpO2 96%   BMI 32.09 kg/m  Gen:   Awake, no distress  *** Resp:  Normal effort *** MSK:   Moves extremities without difficulty *** Other:  ***  Medical Decision Making  Medically screening exam initiated at 2:40 PM.  Appropriate orders placed.  Latrelle Dodrill was informed that the remainder of the evaluation will be completed by another provider, this initial triage assessment does not replace that evaluation, and the importance of remaining in the ED until their evaluation is complete.  ***

## 2022-05-30 NOTE — ED Notes (Signed)
[facility] called  

## 2022-05-30 NOTE — Care Management (Signed)
From previous notes patient was thinking about going to [facility], which is a hospice for symptom management, then DC home. Patient ended up going home with hospice.

## 2022-05-30 NOTE — Progress Notes (Signed)
TOC CSW received a return call from [place] at [facility] [phone].  Ebony stated [facility] would follow up with pt and family.  Juel Bellerose Tarpley-Carter, MSW, LCSW-A Pronouns:  She/Her/Hers [facility] of Care Clinical Social Worker Direct Number:  [phone] [email or web address]

## 2022-05-30 NOTE — ED Notes (Signed)
Pt in bed, daughter with pt, verbalized understanding d/c and follow up, pt from dpt via [facility].

## 2022-05-31 ENCOUNTER — Telehealth: Payer: Self-pay

## 2022-05-31 ENCOUNTER — Other Ambulatory Visit (HOSPITAL_COMMUNITY): Payer: Self-pay

## 2022-05-31 DIAGNOSIS — R531 Weakness: Secondary | ICD-10-CM | POA: Diagnosis not present

## 2022-05-31 DIAGNOSIS — Z7401 Bed confinement status: Secondary | ICD-10-CM | POA: Diagnosis not present

## 2022-05-31 NOTE — Telephone Encounter (Signed)
Pt called the [facility] line 11/12 for  symptoms of difficulty urinating, walking, pain from bed sores and confusion.  He fell twice and can't stand up.  He was advised to call 911 and go to the ED for an evaluation.  I spoke with pt's daughter, Thomas Michael, and pt was D/C from the ED and he is moving in with her.  He has been referred to [facility]

## 2022-06-01 DIAGNOSIS — F330 Major depressive disorder, recurrent, mild: Secondary | ICD-10-CM | POA: Diagnosis not present

## 2022-06-01 DIAGNOSIS — C7931 Secondary malignant neoplasm of brain: Secondary | ICD-10-CM | POA: Diagnosis not present

## 2022-06-01 DIAGNOSIS — D649 Anemia, unspecified: Secondary | ICD-10-CM | POA: Diagnosis not present

## 2022-06-01 DIAGNOSIS — C641 Malignant neoplasm of right kidney, except renal pelvis: Secondary | ICD-10-CM | POA: Diagnosis not present

## 2022-06-03 ENCOUNTER — Telehealth: Payer: Self-pay

## 2022-06-03 NOTE — Telephone Encounter (Signed)
     Patient  visit on 11/12  at [facility]   Have you been able to follow up with your primary care physician? Yes   The patient was or was not able to obtain any needed medicine or equipment. Yes   Are there diet recommendations that you are having difficulty following? Na   Patient expresses understanding of discharge instructions and education provided has no other needs at this time.  Yes     [place], [facility], Care Management  [phone] [address], [place], [place] [address] Phone: [phone] Email: [email or web address]

## 2022-06-16 ENCOUNTER — Encounter: Payer: Self-pay | Admitting: Oncology

## 2022-06-18 ENCOUNTER — Encounter: Payer: Self-pay | Admitting: Oncology

## 2022-06-22 ENCOUNTER — Inpatient Hospital Stay: Payer: Medicare Other

## 2022-06-22 ENCOUNTER — Inpatient Hospital Stay: Payer: Medicare Other | Admitting: Oncology

## 2022-07-28 IMAGING — CT CT CHEST W/ CM
1 of 32 series · 3 of 48 positions shown, 7 images · IV contrast (OMNIPAQUE)
Comparison: Multiple exams, including MRI abdomen 01/18/2013 and
left hip MRI from 07/21/2021 as well as CT abdomen from 08/06/2014

CLINICAL DATA: History of renal cell carcinoma with right radical
nephrectomy in 8208, recent left hip MRI showed a lytic mass of the
left proximal femur.

EXAM:
CT CHEST WITH CONTRAST
CT ABDOMEN AND PELVIS WITH AND WITHOUT CONTRAST
TECHNIQUE: Multidetector CT imaging of the chest was performed during
intravenous contrast administration. Multidetector CT imaging of the
abdomen and pelvis was performed following the standard protocol
before and during bolus administration of intravenous contrast.

[Series 16: axial nephro · axial · 0.97mm/px · z∈[-609,-132]mm · 3 of 160 slices shown, 7 images]
[im 1/160  soft-tissue]
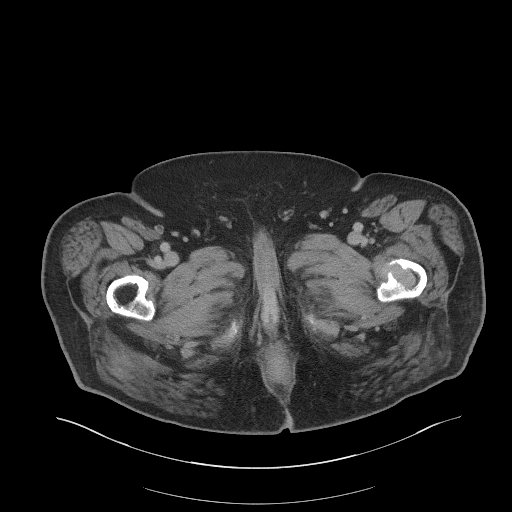
[im 1/160  lung]
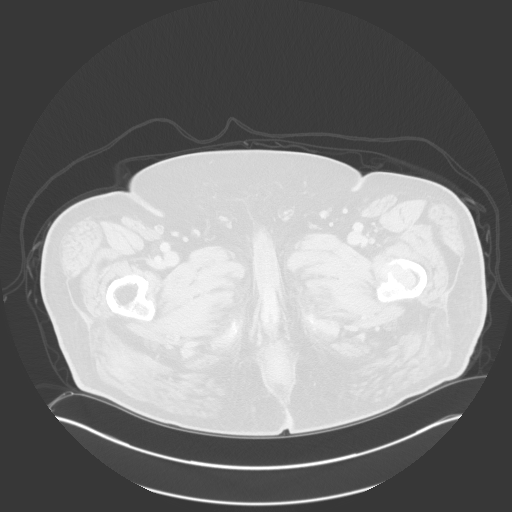
[im 1/160  bone]
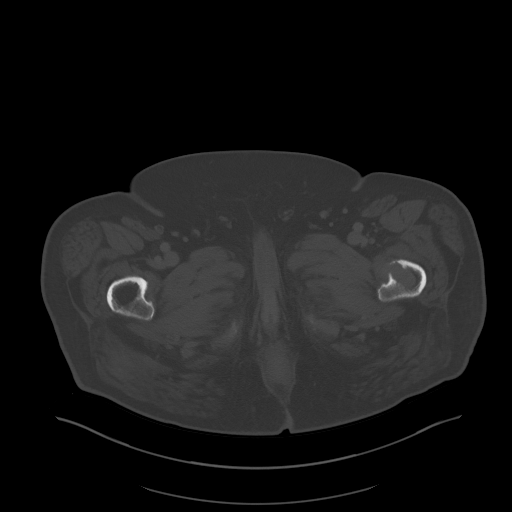
[im 80/160  soft-tissue]
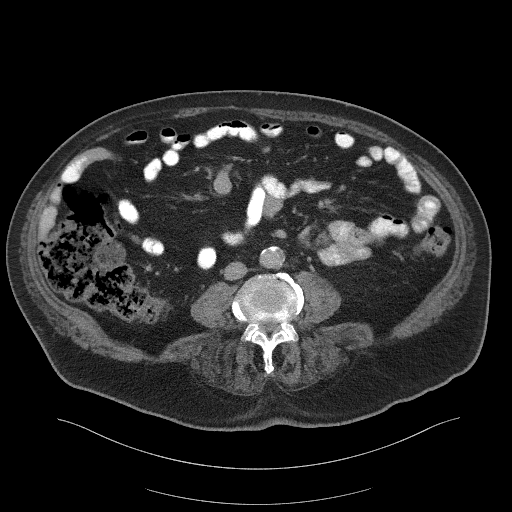
[im 80/160  lung]
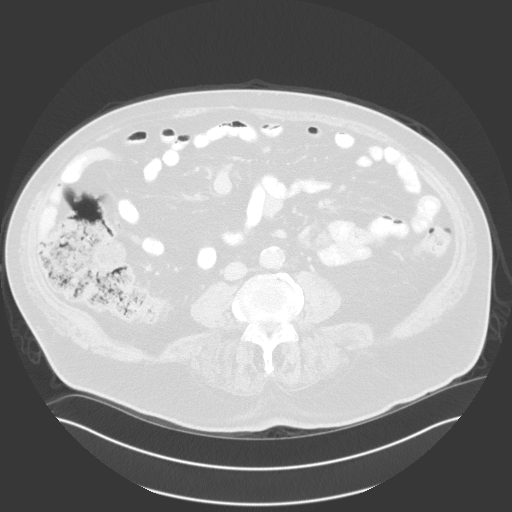
[im 160/160  soft-tissue]
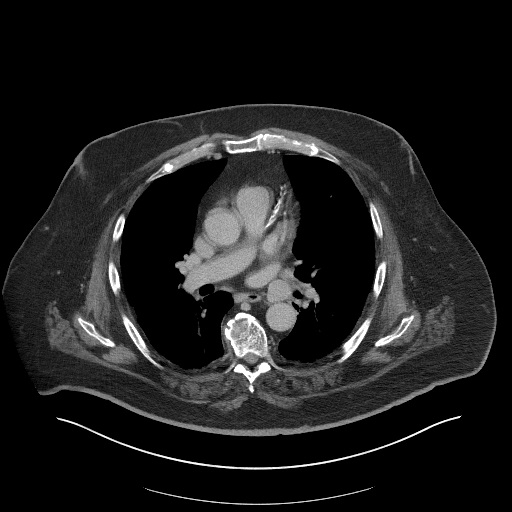
[im 160/160  lung]
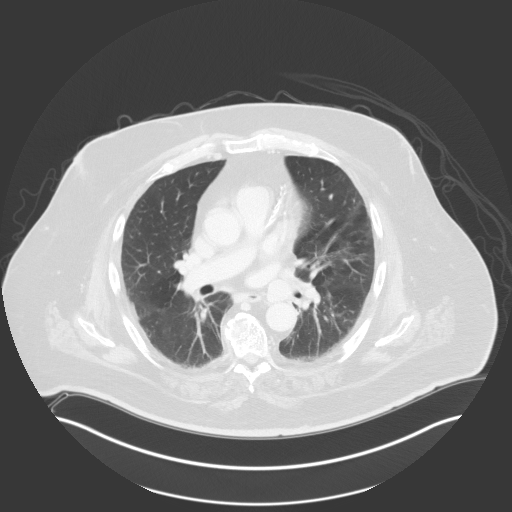

[3 of 48 positions shown; findings below may reference images not displayed]

RADIATION DOSE REDUCTION: This exam was performed according to the
departmental dose-optimization program which includes automated
exposure control, adjustment of the mA and/or kV according to
patient size and/or use of iterative reconstruction technique.

CONTRAST:  80mL OMNIPAQUE IOHEXOL 350 MG/ML SOLN
FINDINGS: CT CHEST FINDINGS

Cardiovascular: Coronary, aortic arch, and branch vessel
atherosclerotic vascular disease.

Mediastinum/Nodes: Pathologic and substantially enhancing thoracic
adenopathy is present. Index left AP window lymph node 1.5 cm in
short axis on image 40 series 11, abnormally enlarged. Left lower
periaortic lymph node 2.1 cm in short axis on image 47 series 11. An
adjacent left infrahilar node measures 1.6 cm in short axis, image
50 series 11.

Small type 1 hiatal hernia.

Lungs/Pleura: Several solid pulmonary nodules are present. 3 mm
right upper lobe pulmonary nodule, image 40, series July 20, 13 by
cm right middle lobe pulmonary nodule, image 93 series [DATE] cm
subpleural nodule posteriorly in the right upper lobe on image 36
series [DATE] by 0.3 cm left upper lobe nodule on image 66 series
July 19, 13 by 0.8 cm left lower lobe nodule, image 87 series [DATE] by
0.5 cm left lower lobe nodule on image 76 series 15.

Musculoskeletal: A hyperdense subcutaneous nodule posterior to the
left shoulder musculature on image 31 of series 11 measures 1.5 by
1.0 cm on image 31 series 11. Thoracic spondylosis with multilevel
bridging osteophytes.

CT ABDOMEN AND PELVIS FINDINGS

Hepatobiliary: Cholecystectomy. 0.6 by 0.3 cm tiny enhancing focus
at the dome of the right hepatic lobe on image 26 of series 6 on
arterial phase images, not well seen on other phases, probably
incidental but potentially meriting surveillance. A second small
focus of arterial phase enhancement medially in the right hepatic
lobe on image 69 of series 7 measures 0.7 by 0.5 cm and likewise may
merit surveillance in this clinical context.

Pancreas: 0.6 by 0.5 cm focus of arterial phase enhancement in the
pancreatic body on image 45 series 7 and image 39 series 6, a small
metastatic lesion is not excluded.

Spleen: Unremarkable

Adrenals/Urinary Tract: New briskly arterial phase enhancing nodules
are present in the adrenal glands, about 3 new nodules on each side
compared to prior CT of 08/06/2014. The most cephalad of the left
adrenal lesions measures 1.5 by 1.3 cm.

Right nephrectomy without findings of locally recurrent mass along
the nephrectomy bed. Small hypodense lesions in the left kidney are
technically too small to characterize although statistically likely
to be cysts. Urinary bladder nondistended, shaggy/micronodular
appearance along the margin of the bladder roof anteriorly is
nonspecific but at least some of this appearance is due to
accentuated vascularity. A component of tumor nodularity along the
anterior bladder roof is not excluded on image 128 series 6.

Stomach/Bowel: Proximal transverse duodenal diverticula. Mildly
redundant sigmoid colon. A multilobulated mesenteric mass measuring
about 6.0 by 1.7 by 4.3 cm in central mesentery is closely
associated with the margin of an adjacent loop of small bowel on
images 67 through 83 of series 16.

Vascular/Lymphatic: Atherosclerosis is present, including aortoiliac
atherosclerotic disease.

Reproductive: Unremarkable

Other: Pre peritoneal nodule anterior to the dome of the urinary
bladder is 0.7 cm in diameter on image 128 series 16.

Musculoskeletal: Lytic destructive lesion of the left proximal femur
noted on image 158 of series 16, as described on recent MRI. This is
accompanied by high density/enhancing lesion replacing the local
fatty marrow.

Bilateral pars defects at L5 with 9 mm of anterolisthesis of L5 on
S1 contributing to mild degrees of bilateral foraminal impingement
at the L5-S1 level.
IMPRESSION: 1. Widespread scattered metastatic disease, with most of the lesions
demonstrating brisk arterial phase enhancement. Today's exam reveals
metastatic lesions of the lungs, mediastinum, bilateral adrenal
glands, mesentery and adjacent small bowel, and left proximal femur.
Other suspicious lesions include a small arterial phase enhancing
nodule in the pancreas, an enhancing subcutaneous nodule posterior
to the left shoulder/upper chest, and a small pre peritoneal nodule
anterior to the dome of the urinary bladder. Shaggy nodularity of
the anterior superior urinary bladder may reflect metastatic lesions
along the urinary bladder.
2. Two tiny arterial phase enhancing lesions in the liver are
nonspecific but in this clinical context merit surveillance.
3. Other imaging findings of potential clinical significance: Aortic
Atherosclerosis ([id number]). Coronary atherosclerosis. Small type
1 hiatal hernia. Thoracolumbar spondylosis. Pars defects at L5 with
anterolisthesis of L5 on S1 resulting in mild bilateral foraminal
impingement. Systemic atherosclerosis.

## 2022-08-06 IMAGING — US US GUIDANCE NEEDLE PLACEMENT
1 series · 13 of 18 positions shown · non-contrast
Comparison: none

INDICATION: Remote history of renal cell carcinoma, status post right radical
nephrectomy. Left femur lytic lesion. Concern for metastatic
disease.

[Series 1: us guided needle placement · 13 of 18 slices shown]
[im 1/18]
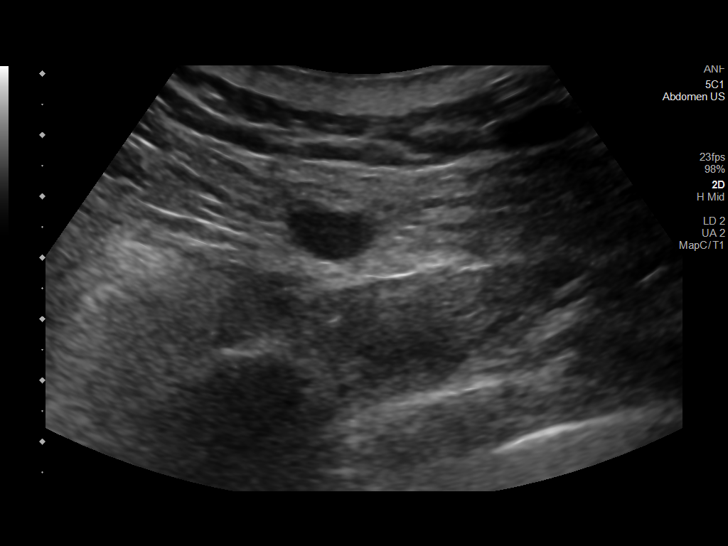
[im 3/18]
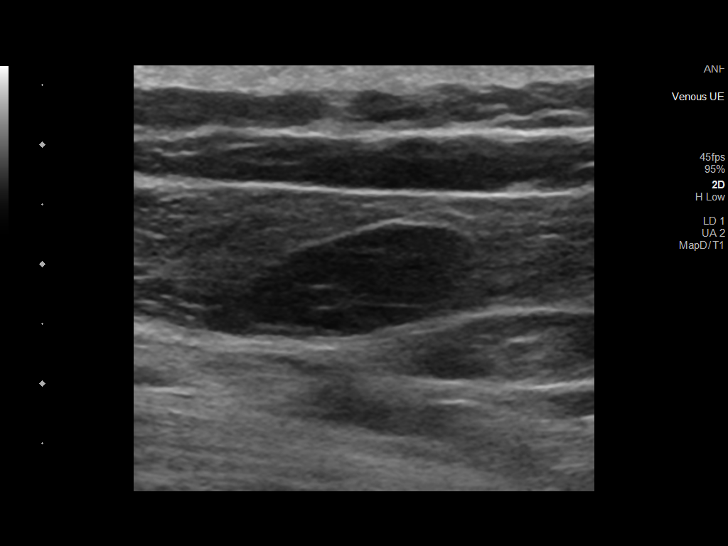
[im 4/18]
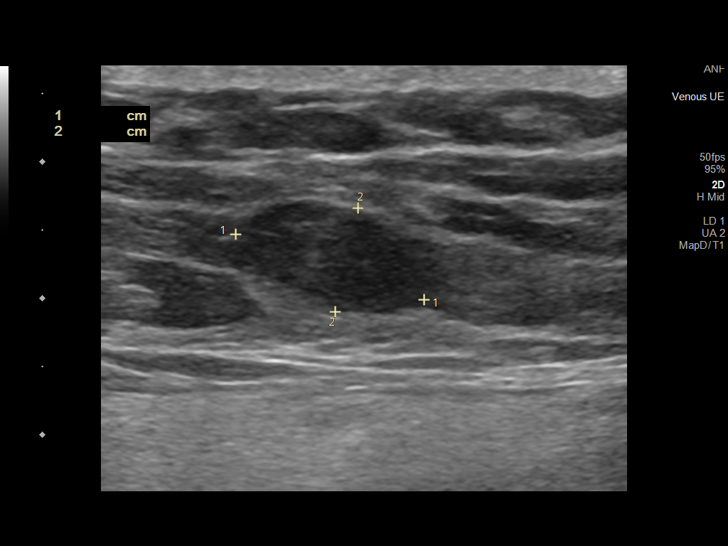
[im 5/18]
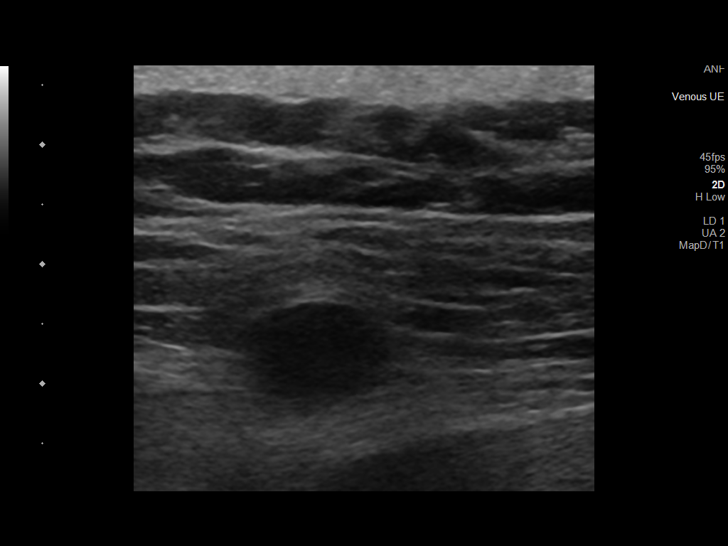
[im 7/18]
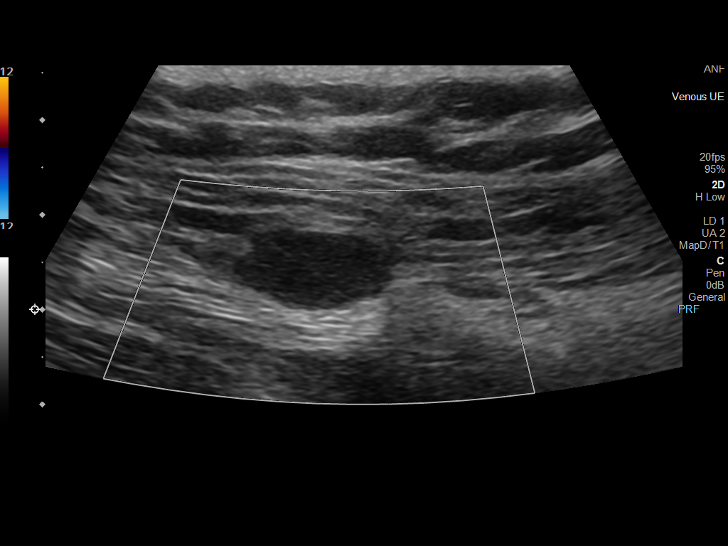
[im 8/18]
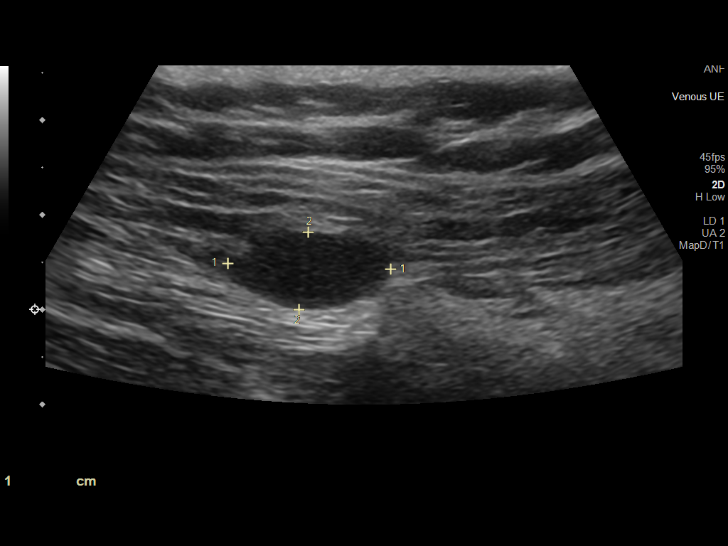
[im 10/18]
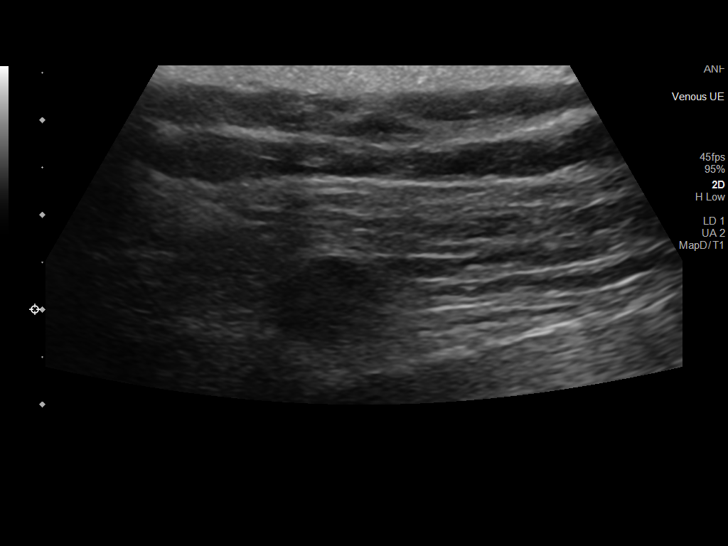
[im 11/18]
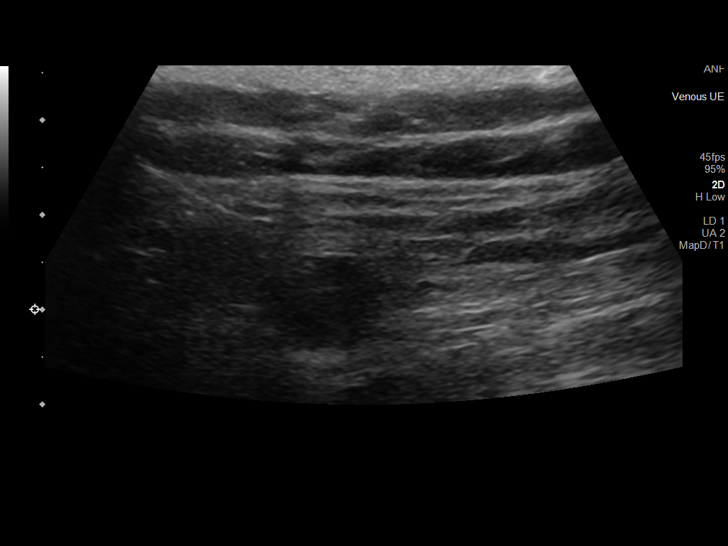
[im 12/18]
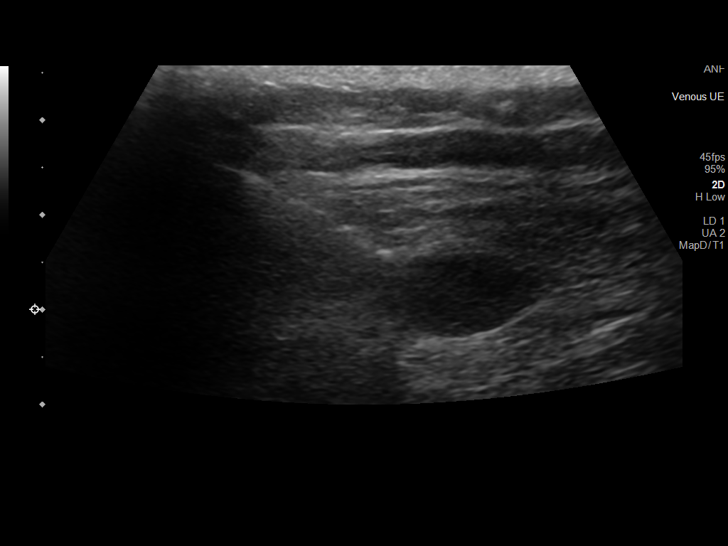
[im 14/18]
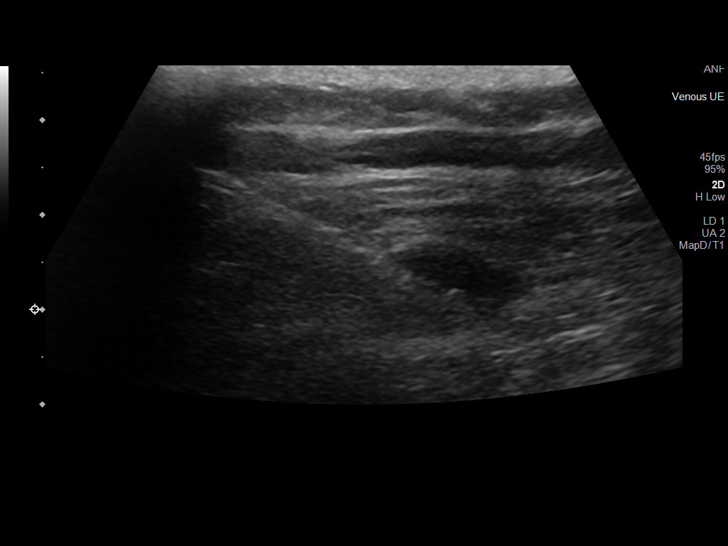
[im 15/18]
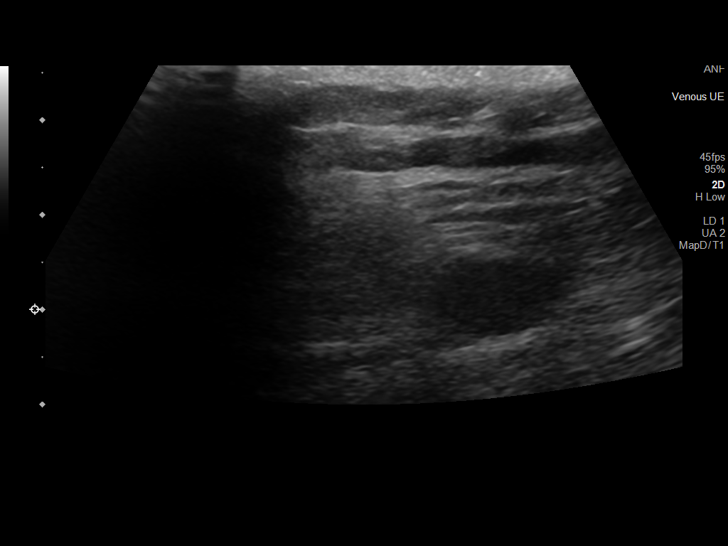
[im 16/18]
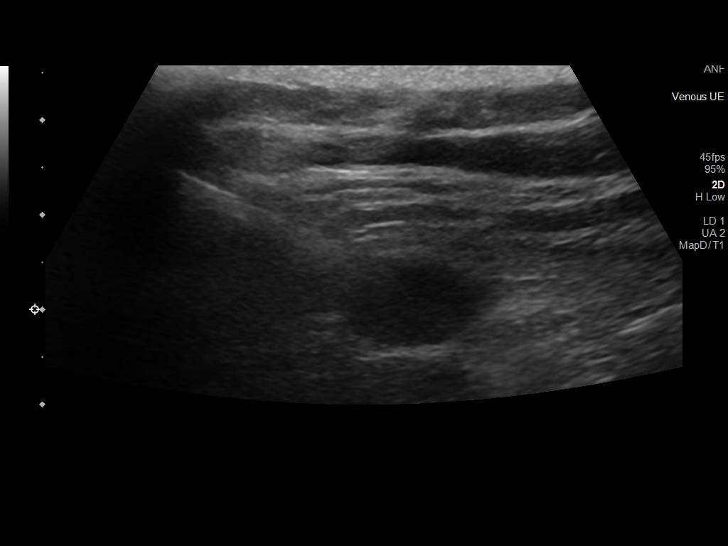
[im 18/18]
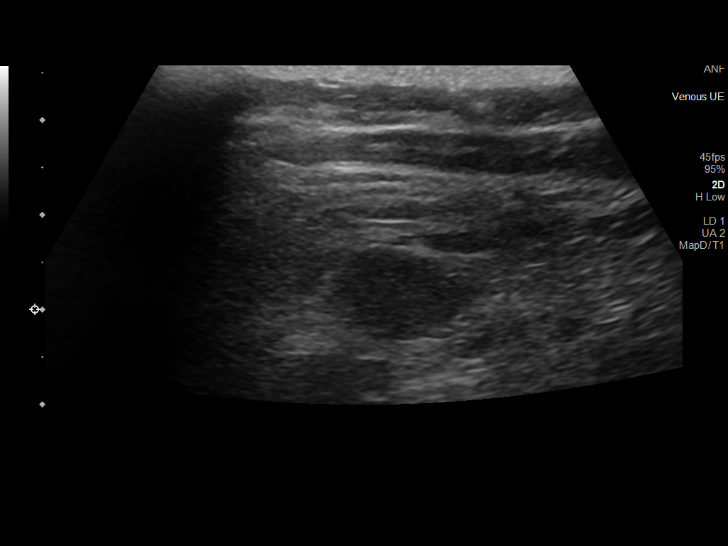

[13 of 18 positions shown; findings below may reference images not displayed]

EXAM:
ULTRASOUND CORE BIOPSY LEFT POSTERIOR SHOULDER SUBCUTANEOUS NODULE.

MEDICATIONS:
1% lidocaine local

ANESTHESIA/SEDATION:
Moderate (conscious) sedation was employed during this procedure. A
total of Versed 1.0 mg and Fentanyl 25 mcg was administered
intravenously by the radiology nurse.

Total intra-service moderate Sedation Time: 10 minutes. The
patient's level of consciousness and vital signs were monitored
continuously by radiology nursing throughout the procedure under my
direct supervision.

FLUOROSCOPY TIME:  Fluoroscopy Time: None.

COMPLICATIONS:
None immediate.

PROCEDURE:
Informed written consent was obtained from the patient after a
thorough discussion of the procedural risks, benefits and
alternatives. All questions were addressed. Maximal Sterile Barrier
Technique was utilized including caps, mask, sterile gowns, sterile
gloves, sterile drape, hand hygiene and skin antiseptic. A timeout
was performed prior to the initiation of the procedure.

Previous imaging reviewed. Preliminary ultrasound performed. The
left posterior chest subcutaneous nodule was localized and marked.

Under sterile conditions and local anesthesia, an 18 gauge core
biopsy was advanced to the nodule. 18 gauge core biopsies obtained.
Samples were placed in saline. Needle position confirmed with
ultrasound. Images obtained for documentation. Postprocedure imaging
demonstrates no hemorrhage or hematoma.
IMPRESSION: Successful ultrasound left posterior shoulder subcutaneous nodule 18
gauge core biopsy
